# Patient Record
Sex: Male | Born: 1970 | Race: Black or African American | Hispanic: No | Marital: Single | Smoking: Never smoker
Health system: Southern US, Community
[De-identification: ages and names within clinical notes are randomized; demographics above are authoritative.]

## PROBLEM LIST (undated history)

## (undated) ENCOUNTER — Ambulatory Visit: Admission: EM

## (undated) DIAGNOSIS — L0293 Carbuncle, unspecified: Secondary | ICD-10-CM

## (undated) DIAGNOSIS — F209 Schizophrenia, unspecified: Secondary | ICD-10-CM

## (undated) DIAGNOSIS — D229 Melanocytic nevi, unspecified: Secondary | ICD-10-CM

## (undated) DIAGNOSIS — R3589 Other polyuria: Secondary | ICD-10-CM

## (undated) DIAGNOSIS — L0292 Furuncle, unspecified: Secondary | ICD-10-CM

## (undated) DIAGNOSIS — H547 Unspecified visual loss: Secondary | ICD-10-CM

## (undated) DIAGNOSIS — R569 Unspecified convulsions: Secondary | ICD-10-CM

## (undated) DIAGNOSIS — R109 Unspecified abdominal pain: Secondary | ICD-10-CM

## (undated) DIAGNOSIS — R358 Other polyuria: Secondary | ICD-10-CM

## (undated) DIAGNOSIS — M7989 Other specified soft tissue disorders: Secondary | ICD-10-CM

## (undated) DIAGNOSIS — K5900 Constipation, unspecified: Secondary | ICD-10-CM

## (undated) DIAGNOSIS — F419 Anxiety disorder, unspecified: Secondary | ICD-10-CM

## (undated) HISTORY — DX: Other specified soft tissue disorders: M79.89

## (undated) HISTORY — DX: Other polyuria: R35.8

## (undated) HISTORY — DX: Anxiety disorder, unspecified: F41.9

## (undated) HISTORY — DX: Furuncle, unspecified: L02.92

## (undated) HISTORY — DX: Constipation, unspecified: K59.00

## (undated) HISTORY — DX: Unspecified abdominal pain: R10.9

## (undated) HISTORY — DX: Melanocytic nevi, unspecified: D22.9

## (undated) HISTORY — DX: Other polyuria: R35.89

## (undated) HISTORY — DX: Unspecified visual loss: H54.7

## (undated) HISTORY — DX: Schizophrenia, unspecified: F20.9

## (undated) HISTORY — DX: Carbuncle, unspecified: L02.93

---

## 1990-08-15 DIAGNOSIS — R569 Unspecified convulsions: Secondary | ICD-10-CM

## 1990-08-15 HISTORY — DX: Unspecified convulsions: R56.9

## 2005-01-13 ENCOUNTER — Emergency Department: Payer: Self-pay | Admitting: Unknown Physician Specialty

## 2005-09-27 ENCOUNTER — Emergency Department: Payer: Self-pay | Admitting: Emergency Medicine

## 2006-04-30 ENCOUNTER — Emergency Department: Payer: Self-pay | Admitting: Unknown Physician Specialty

## 2006-05-24 ENCOUNTER — Inpatient Hospital Stay: Payer: Self-pay | Admitting: Internal Medicine

## 2008-03-19 ENCOUNTER — Emergency Department: Payer: Self-pay | Admitting: Emergency Medicine

## 2015-01-08 ENCOUNTER — Ambulatory Visit (INDEPENDENT_AMBULATORY_CARE_PROVIDER_SITE_OTHER): Payer: Medicaid Other | Admitting: Surgery

## 2015-01-08 ENCOUNTER — Encounter: Payer: Self-pay | Admitting: Surgery

## 2015-01-08 VITALS — BP 119/80 | HR 86 | Temp 97.7°F | Resp 20 | Ht 68.0 in | Wt 150.0 lb

## 2015-01-08 DIAGNOSIS — R221 Localized swelling, mass and lump, neck: Secondary | ICD-10-CM | POA: Diagnosis not present

## 2015-01-08 NOTE — Progress Notes (Signed)
  Surgical Consultation  01/08/2015  Carl [name]. is an 44 y.o. male.   CC: Neck mass  HPI: Large neck mass at the occiput on the left side which has been present for over 15 years. It causes him minimal pain if any but has been growing.  Past Medical History  Diagnosis Date  . Schizophrenia     History reviewed. No pertinent past surgical history.  Family History  Problem Relation Age of Onset  . Hyperlipidemia Mother   . Hypertension Father   . Diabetes Father     Social History:  reports that he has never smoked. He has never used smokeless tobacco. He reports that he does not drink alcohol or use illicit drugs.  Allergies: No Known Allergies  Medications reviewed.   Review of Systems:   Review of Systems  Constitutional: Negative.   HENT: Negative.   Eyes: Negative.   Respiratory: Negative.   Cardiovascular: Negative.   Gastrointestinal: Negative.   Genitourinary: Negative.   Musculoskeletal: Negative.   Skin: Negative.   Neurological: Negative.   Endo/Heme/Allergies: Negative.   Psychiatric/Behavioral:       Patient is schizophrenic     Physical Exam:  BP 119/80 mmHg  Pulse 86  Temp(Src) 97.7 F (36.5 C) (Oral)  Resp 20  Ht 5\' 8"  (1.727 m)  Wt 150 lb (68.04 kg)  BMI 22.81 kg/m2  Physical Exam  Constitutional: He is oriented to person, place, and time and well-developed, well-nourished, and in no distress.  HENT:  Head: Normocephalic.  Left occipital and nuchal mass measuring 8 cm  Eyes: Pupils are equal, round, and reactive to light. No scleral icterus.  Neck: Normal range of motion. Neck supple.  Left nuchal and occipital mass  Cardiovascular: Normal rate, regular rhythm and normal heart sounds.   Pulmonary/Chest: Effort normal and breath sounds normal. No respiratory distress. He has no wheezes. He has no rales.  Abdominal: Soft. Bowel sounds are normal.  Musculoskeletal: Normal range of motion.  Lymphadenopathy:    He has no  cervical adenopathy.  Neurological: He is alert and oriented to person, place, and time.  Skin: Skin is warm and dry.  Psychiatric: Affect normal.      No results found for this or any previous visit (from the past 48 hour(s)). No results found.  Assessment/Plan:  This patient with a large nuchal occipital mass on the left side. It is been growing but causing him minimal if any pain. They're requesting excision. His mother is present as well. He is schizophrenic.  Discussed with them the rationale for offering surgery the options of observation and the risks of bleeding infection recurrence cosmetic deformity accessory spinal nerve injury with shoulder shrug issues or weakness. This was all reviewed for them they understood and agreed to proceed.  Florene Glen, MD, FACS

## 2015-01-15 ENCOUNTER — Telehealth: Payer: Self-pay | Admitting: Surgery

## 2015-01-15 NOTE — Telephone Encounter (Signed)
Pt advised of pre op date/time and sx date. Sx: 02/11/15 with Dr Burt Knack in [place].  Pt was advised that [place] sx will call pt with details.

## 2015-02-04 ENCOUNTER — Encounter: Payer: Self-pay | Admitting: *Deleted

## 2015-02-10 ENCOUNTER — Other Ambulatory Visit: Payer: Self-pay | Admitting: *Deleted

## 2015-02-10 ENCOUNTER — Encounter: Payer: Self-pay | Admitting: *Deleted

## 2015-02-11 ENCOUNTER — Encounter
Admission: RE | Disposition: A | Discharge status: Home / Self Care | Payer: Self-pay | Source: Ambulatory Visit | Attending: Surgery

## 2015-02-11 ENCOUNTER — Ambulatory Visit: Payer: Medicare Other | Admitting: Anesthesiology

## 2015-02-11 ENCOUNTER — Other Ambulatory Visit
Admission: RE | Admit: 2015-02-11 | Discharge: 2015-02-11 | Disposition: A | Discharge status: Home / Self Care | Payer: Medicare Other | Source: Ambulatory Visit | Attending: Surgery | Admitting: Surgery

## 2015-02-11 ENCOUNTER — Ambulatory Visit
Admission: RE | Admit: 2015-02-11 | Discharge: 2015-02-11 | Disposition: A | Discharge status: Home / Self Care | Payer: Medicare Other | Source: Ambulatory Visit | Attending: Surgery | Admitting: Surgery

## 2015-02-11 DIAGNOSIS — Z8489 Family history of other specified conditions: Secondary | ICD-10-CM | POA: Insufficient documentation

## 2015-02-11 DIAGNOSIS — D170 Benign lipomatous neoplasm of skin and subcutaneous tissue of head, face and neck: Secondary | ICD-10-CM | POA: Insufficient documentation

## 2015-02-11 DIAGNOSIS — Z8249 Family history of ischemic heart disease and other diseases of the circulatory system: Secondary | ICD-10-CM | POA: Insufficient documentation

## 2015-02-11 DIAGNOSIS — R221 Localized swelling, mass and lump, neck: Secondary | ICD-10-CM | POA: Diagnosis not present

## 2015-02-11 DIAGNOSIS — Z833 Family history of diabetes mellitus: Secondary | ICD-10-CM | POA: Insufficient documentation

## 2015-02-11 DIAGNOSIS — D234 Other benign neoplasm of skin of scalp and neck: Secondary | ICD-10-CM

## 2015-02-11 DIAGNOSIS — F209 Schizophrenia, unspecified: Secondary | ICD-10-CM | POA: Diagnosis not present

## 2015-02-11 DIAGNOSIS — R109 Unspecified abdominal pain: Secondary | ICD-10-CM | POA: Diagnosis present

## 2015-02-11 HISTORY — PX: MASS EXCISION: SHX2000

## 2015-02-11 LAB — CBC WITH DIFFERENTIAL/PLATELET
BASOS PCT: 1 %
Basophils Absolute: 0 10*3/uL (ref 0–0.1)
EOS ABS: 0.1 10*3/uL (ref 0–0.7)
Eosinophils Relative: 2 %
HCT: 41.9 % (ref 40.0–52.0)
HEMOGLOBIN: 13.8 g/dL (ref 13.0–18.0)
Lymphocytes Relative: 50 %
Lymphs Abs: 2.3 10*3/uL (ref 1.0–3.6)
MCH: 27.1 pg (ref 26.0–34.0)
MCHC: 32.9 g/dL (ref 32.0–36.0)
MCV: 82.2 fL (ref 80.0–100.0)
MONO ABS: 0.4 10*3/uL (ref 0.2–1.0)
MONOS PCT: 9 %
NEUTROS ABS: 1.7 10*3/uL (ref 1.4–6.5)
Neutrophils Relative %: 38 %
Platelets: 181 10*3/uL (ref 150–440)
RBC: 5.09 MIL/uL (ref 4.40–5.90)
RDW: 14.2 % (ref 11.5–14.5)
WBC: 4.5 10*3/uL (ref 3.8–10.6)

## 2015-02-11 LAB — BASIC METABOLIC PANEL
Anion gap: 5 (ref 5–15)
BUN: 13 mg/dL (ref 6–20)
CALCIUM: 9.3 mg/dL (ref 8.9–10.3)
CO2: 32 mmol/L (ref 22–32)
Chloride: 103 mmol/L (ref 101–111)
Creatinine, Ser: 1.07 mg/dL (ref 0.61–1.24)
GFR calc Af Amer: >60 mL/min (ref >60)
GLUCOSE: 99 mg/dL (ref 65–99)
Potassium: 4.1 mmol/L (ref 3.5–5.1)
Sodium: 140 mmol/L (ref 135–145)

## 2015-02-11 SURGERY — EXCISION MASS
Anesthesia: General | Laterality: Left | Wound class: Clean

## 2015-02-11 MED ORDER — MIDAZOLAM HCL 5 MG/5ML IJ SOLN
INTRAMUSCULAR | Status: DC | PRN
  Administered 2015-02-11: 2 mg via INTRAVENOUS

## 2015-02-11 MED ORDER — OXYCODONE HCL 5 MG/5ML PO SOLN: 5.0000 mg | Freq: Once | ORAL | Status: DC | PRN

## 2015-02-11 MED ORDER — PROPOFOL 10 MG/ML IV BOLUS
INTRAVENOUS | Status: DC | PRN
  Administered 2015-02-11: 200 mg via INTRAVENOUS

## 2015-02-11 MED ORDER — FENTANYL CITRATE (PF) 100 MCG/2ML IJ SOLN
INTRAMUSCULAR | Status: DC | PRN
  Administered 2015-02-11: 100 ug via INTRAVENOUS

## 2015-02-11 MED ORDER — LIDOCAINE HCL (CARDIAC) 20 MG/ML IV SOLN
INTRAVENOUS | Status: DC | PRN
  Administered 2015-02-11: 40 mg via INTRATRACHEAL

## 2015-02-11 MED ORDER — OXYCODONE HCL 5 MG PO TABS: 5.0000 mg | ORAL_TABLET | Freq: Once | ORAL | Status: DC | PRN

## 2015-02-11 MED ORDER — HYDROCODONE-ACETAMINOPHEN 5-300 MG PO TABS: 1.0000 | ORAL_TABLET | ORAL | Status: DC | PRN

## 2015-02-11 MED ORDER — BUPIVACAINE-EPINEPHRINE 0.5% -1:200000 IJ SOLN
INTRAMUSCULAR | Status: DC | PRN
  Administered 2015-02-11: 14 mL

## 2015-02-11 MED ORDER — HYDROMORPHONE HCL 1 MG/ML IJ SOLN: 0.2500 mg | INTRAMUSCULAR | Status: DC | PRN

## 2015-02-11 MED ORDER — ONDANSETRON HCL 4 MG/2ML IJ SOLN: 4.0000 mg | Freq: Once | INTRAMUSCULAR | Status: DC | PRN

## 2015-02-11 MED ORDER — LACTATED RINGERS IV SOLN
INTRAVENOUS | Status: DC
  Administered 2015-02-11: 13:00:00 via INTRAVENOUS

## 2015-02-11 MED ORDER — ONDANSETRON HCL 4 MG/2ML IJ SOLN
INTRAMUSCULAR | Status: DC | PRN
  Administered 2015-02-11: 4 mg via INTRAVENOUS

## 2015-02-11 MED ORDER — DEXAMETHASONE SODIUM PHOSPHATE 4 MG/ML IJ SOLN
INTRAMUSCULAR | Status: DC | PRN
  Administered 2015-02-11: 8 mg via INTRAVENOUS

## 2015-02-11 SURGICAL SUPPLY — 50 items
ADHESIVE MASTISOL STRL (MISCELLANEOUS) IMPLANT
BLADE SURG 15 STRL LF DISP TIS (BLADE) ×1 IMPLANT
BLADE SURG 15 STRL SS (BLADE) ×2
CANISTER SUCT 1200ML W/VALVE (MISCELLANEOUS) ×3 IMPLANT
CHLORAPREP W/TINT 26ML (MISCELLANEOUS) ×3 IMPLANT
CLOSURE WOUND 1/2 X4 (GAUZE/BANDAGES/DRESSINGS)
COVER LIGHT HANDLE FLEXIBLE (MISCELLANEOUS) ×6 IMPLANT
DRAPE LAPAROTOMY T 102X78X121 (DRAPES) ×3 IMPLANT
DRSG TEGADERM 4X4.75 (GAUZE/BANDAGES/DRESSINGS) IMPLANT
GAUZE SPONGE 4X4 12PLY STRL (GAUZE/BANDAGES/DRESSINGS) ×3 IMPLANT
GLOVE BIO SURGEON STRL SZ7.5 (GLOVE) ×3 IMPLANT
GOWN STRL REUS W/ TWL LRG LVL3 (GOWN DISPOSABLE) ×1 IMPLANT
GOWN STRL REUS W/ TWL XL LVL3 (GOWN DISPOSABLE) ×1 IMPLANT
GOWN STRL REUS W/TWL LRG LVL3 (GOWN DISPOSABLE) ×2
GOWN STRL REUS W/TWL XL LVL3 (GOWN DISPOSABLE) ×2
LIQUID BAND (GAUZE/BANDAGES/DRESSINGS) IMPLANT
NDL HYPO TW 22X1.5 (NEEDLE) ×3 IMPLANT
NEEDLE HYPO 25GX1X1/2 BEV (NEEDLE) ×3 IMPLANT
NS IRRIG 500ML POUR BTL (IV SOLUTION) ×3 IMPLANT
PACK BASIN MINOR ARMC (MISCELLANEOUS) ×3 IMPLANT
PAD GROUND ADULT SPLIT (MISCELLANEOUS) ×3 IMPLANT
SOL PREP PVP 2OZ (MISCELLANEOUS)
SOLUTION PREP PVP 2OZ (MISCELLANEOUS) IMPLANT
SPONGE LAP 18X18 5 PK (GAUZE/BANDAGES/DRESSINGS) ×3 IMPLANT
STRAP BODY AND KNEE 60X3 (MISCELLANEOUS) ×3 IMPLANT
STRIP CLOSURE SKIN 1/2X4 (GAUZE/BANDAGES/DRESSINGS) IMPLANT
SUT ETHILON 2 0 FSLX (SUTURE) IMPLANT
SUT ETHILON 3-0 FS-10 30 BLK (SUTURE)
SUT ETHILON 4-0 (SUTURE)
SUT ETHILON 4-0 FS2 18XMFL BLK (SUTURE)
SUT MNCRL 4-0 (SUTURE) ×2
SUT MNCRL 4-0 27XMFL (SUTURE) ×1
SUT MNCRL 5-0+ PC-1 (SUTURE) IMPLANT
SUT MONOCRYL 5-0 (SUTURE)
SUT PROLENE 0 CT 1 30 (SUTURE) IMPLANT
SUT VIC AB 0 CT1 36 (SUTURE) IMPLANT
SUT VIC AB 2-0 CT1 27 (SUTURE)
SUT VIC AB 2-0 CT1 TAPERPNT 27 (SUTURE) IMPLANT
SUT VIC AB 2-0 SH 27 (SUTURE)
SUT VIC AB 2-0 SH 27XBRD (SUTURE) IMPLANT
SUT VIC AB 3-0 SH 27 (SUTURE) ×2
SUT VIC AB 3-0 SH 27X BRD (SUTURE) ×1 IMPLANT
SUT VIC AB 4-0 PS2 18 (SUTURE) IMPLANT
SUT VIC AB 4-0 RB1 27 (SUTURE)
SUT VIC AB 4-0 RB1 27X BRD (SUTURE) IMPLANT
SUTURE EHLN 3-0 FS-10 30 BLK (SUTURE) IMPLANT
SUTURE ETHLN 4-0 FS2 18XMF BLK (SUTURE) IMPLANT
SUTURE MNCRL 4-0 27XMF (SUTURE) ×1 IMPLANT
SYR BULB IRRIG 60ML STRL (SYRINGE) ×3 IMPLANT
SYRINGE 10CC LL (SYRINGE) ×3 IMPLANT

## 2015-02-11 NOTE — Progress Notes (Signed)
Preoperative Review   Patient is met in the preoperative holding area. The history is reviewed in the chart and with the patient. I personally reviewed the options and rationale as well as the risks of this procedure that have been previously discussed with the patient. All questions asked by the patient and/or family were answered to their satisfaction.  Patient reexamined and marked history was reviewed with patient and family.  Patient agrees to proceed with this procedure at this time.  Florene Glen M.D. FACS

## 2015-02-11 NOTE — Discharge Instructions (Signed)
General Anesthesia, Care After Refer to this sheet in the next few weeks. These instructions provide you with information on caring for yourself after your procedure. Your health care provider may also give you more specific instructions. Your treatment has been planned according to current medical practices, but problems sometimes occur. Call your health care provider if you have any problems or questions after your procedure. WHAT TO EXPECT AFTER THE PROCEDURE After the procedure, it is typical to experience:  Sleepiness.  Nausea and vomiting. HOME CARE INSTRUCTIONS  For the first 24 hours after general anesthesia:  Have a responsible person with you.  Do not drive a car. If you are alone, do not take public transportation.  Do not drink alcohol.  Do not take medicine that has not been prescribed by your health care provider.  Do not sign important papers or make important decisions.  You may resume a normal diet and activities as directed by your health care provider.  Change bandages (dressings) as directed.  If you have questions or problems that seem related to general anesthesia, call the hospital and ask for the anesthetist or anesthesiologist on call. SEEK MEDICAL CARE IF:  You have nausea and vomiting that continue the day after anesthesia.  You develop a rash. SEEK IMMEDIATE MEDICAL CARE IF:   You have difficulty breathing.  You have chest pain.  You have any allergic problems. Document Released: 09/27/2000 Document Revised: 06/26/2013 Document Reviewed: 01/04/2013 [facility] Patient Information 2015 [place], [place]. This information is not intended to replace advice given to you by your health care provider. Make sure you discuss any questions you have with your health care provider.  Remove dressing in 24 hours. May shower in 24 hours. Leave paper strips in place. Resume all home medications. Follow-up with Dr. Burt Knack in 10 days.

## 2015-02-11 NOTE — Transfer of Care (Signed)
Immediate Anesthesia Transfer of Care Note  Patient: Carl [name].  Procedure(s) Performed: Procedure(s): EXCISION MASS, left post neck (Left)  Patient Location: PACU  Anesthesia Type: General  Level of Consciousness: awake, alert  and patient cooperative  Airway and Oxygen Therapy: Patient Spontanous Breathing and Patient connected to supplemental oxygen  Post-op Assessment: Post-op Vital signs reviewed, Patient's Cardiovascular Status Stable, Respiratory Function Stable, Patent Airway and No signs of Nausea or vomiting  Post-op Vital Signs: Reviewed and stable  Complications: No apparent anesthesia complications

## 2015-02-11 NOTE — Anesthesia Postprocedure Evaluation (Signed)
  Anesthesia Post-op Note  Patient: Carl [name].  Procedure(s) Performed: Procedure(s): EXCISION MASS, left post neck (Left)  Anesthesia type:General  Patient location: PACU  Post pain: Pain level controlled  Post assessment: Post-op Vital signs reviewed, Patient's Cardiovascular Status Stable, Respiratory Function Stable, Patent Airway and No signs of Nausea or vomiting  Post vital signs: Reviewed and stable  Last Vitals:  Filed Vitals:   02/11/15 1445  BP: 101/68  Pulse: 72  Temp:   Resp: 21    Level of consciousness: awake, alert  and patient cooperative  Complications: No apparent anesthesia complications

## 2015-02-11 NOTE — Anesthesia Procedure Notes (Signed)
Procedure Name: LMA Insertion Date/Time: 02/11/2015 1:56 PM Performed by: Londell Moh Pre-anesthesia Checklist: Patient identified, Emergency Drugs available, Suction available, Timeout performed and Patient being monitored Patient Re-evaluated:Patient Re-evaluated prior to inductionOxygen Delivery Method: Circle system utilized Preoxygenation: Pre-oxygenation with 100% oxygen Intubation Type: IV induction LMA: LMA inserted LMA Size: 4.0 Number of attempts: 1 Placement Confirmation: positive ETCO2 and breath sounds checked- equal and bilateral Tube secured with: Tape

## 2015-02-11 NOTE — Op Note (Signed)
02/11/2015  2:32 PM  PATIENT:  Carl [name].  44 y.o. male  PRE-OPERATIVE DIAGNOSIS:  Left occipital mass  POST-OPERATIVE DIAGNOSIS:  Same  PROCEDURE: Excisional biopsy of the left occipital mass  SURGEON:  Florene Glen MD, FACS   ANESTHESIA:   Gen. with LMA   Details of Procedure: This patient with a large left occipital mass requiring excisional biopsy. Preoperatively discussed rationale for surgery the options of observation risk bleeding infection recurrence accessory spinal nerve injury and cosmetic deformity. This is all reviewed for them in the preop holding area in the presence of his family understood and agreed to proceed  Patient was induced to general anesthesia and placed in a well-padded right lateral recumbent position. He is a marked visible and palpable mass was identified and local anesthesia was infiltrated into the skin and subcutaneous tissues around the mass. This was made and dissection down to a large multiloculated lipoma was performed. It was removed in pieces. Estimated size was approximately 7 cm. Vision was 8 cm. The mass was elevated in pieces and sent off for examination. There was no residual mass present hemostasis was with the careful and judicious use of electrocautery. The spinal accessory nerve was not identified.  Once assuring hemostasis was adequate the wound was closed in an intermediate fashion with deep sutures of 30 Vicryls followed by 4-0 subcuticular Monocryl Steri-Strips and Mastisol and sterile dressings were placed  Patient tolerated this procedure well there were a couple occasions he was taken to recovery room in stable condition to be discharged care of his family and follow-up in 10 days.   Florene Glen, MD FACS

## 2015-02-11 NOTE — Anesthesia Preprocedure Evaluation (Signed)
Anesthesia Evaluation  Patient identified by MRN, date of birth, ID band Patient awake    Reviewed: Allergy & Precautions, NPO status , Patient's Chart, lab work & pertinent test results  Airway Mallampati: II  TM Distance: >3 FB Neck ROM: Full    Dental   Pulmonary    Pulmonary exam normal       Cardiovascular Normal cardiovascular exam    Neuro/Psych PSYCHIATRIC DISORDERS Schizophrenia    GI/Hepatic   Endo/Other    Renal/GU      Musculoskeletal   Abdominal   Peds  Hematology   Anesthesia Other Findings   Reproductive/Obstetrics                             Anesthesia Physical Anesthesia Plan  ASA: II  Anesthesia Plan: General   Post-op Pain Management:    Induction: Intravenous  Airway Management Planned: Oral ETT  Additional Equipment:   Intra-op Plan:   Post-operative Plan: Extubation in OR  Informed Consent: I have reviewed the patients History and Physical, chart, labs and discussed the procedure including the risks, benefits and alternatives for the proposed anesthesia with the patient or authorized representative who has indicated his/her understanding and acceptance.   Dental advisory given and Consent reviewed with POA  Plan Discussed with: CRNA  Anesthesia Plan Comments:         Anesthesia Quick Evaluation

## 2015-02-11 NOTE — H&P (Addendum)
Expand All Collapse All    Surgical Consultation  02/11/2015  Carl [name]. is an 44 y.o. male.   I will meet with the patient in the preop holding area at [facility] and review the options rationale and risks with the patient as there have been no changes to his medical conditions.  CC: Neck mass  HPI: Large neck mass at the occiput on the left side which has been present for over 15 years. It causes him minimal pain if any but has been growing.  Past Medical History  Diagnosis Date  . Schizophrenia     History reviewed. No pertinent past surgical history.  Family History  Problem Relation Age of Onset  . Hyperlipidemia Mother   . Hypertension Father   . Diabetes Father     Social History:  reports that he has never smoked. He has never used smokeless tobacco. He reports that he does not drink alcohol or use illicit drugs.  Allergies: No Known Allergies  Medications reviewed.   Review of Systems:   Review of Systems  Constitutional: Negative.  HENT: Negative.  Eyes: Negative.  Respiratory: Negative.  Cardiovascular: Negative.  Gastrointestinal: Negative.  Genitourinary: Negative.  Musculoskeletal: Negative.  Skin: Negative.  Neurological: Negative.  Endo/Heme/Allergies: Negative.  Psychiatric/Behavioral:   Patient is schizophrenic     Physical Exam:  BP 119/80 mmHg  Pulse 86  Temp(Src) 97.7 F (36.5 C) (Oral)  Resp 20  Ht 5\' 8"  (1.727 m)  Wt 150 lb (68.04 kg)  BMI 22.81 kg/m2  Physical Exam  Constitutional: He is oriented to person, place, and time and well-developed, well-nourished, and in no distress.  HENT:  Head: Normocephalic.  Left occipital and nuchal mass measuring 8 cm  Eyes: Pupils are equal, round, and reactive to light. No scleral icterus.  Neck: Normal range of motion. Neck supple.  Left nuchal and occipital mass  Cardiovascular: Normal rate, regular rhythm and normal heart  sounds.  Pulmonary/Chest: Effort normal and breath sounds normal. No respiratory distress. He has no wheezes. He has no rales.  Abdominal: Soft. Bowel sounds are normal.  Musculoskeletal: Normal range of motion.  Lymphadenopathy:   He has no cervical adenopathy.  Neurological: He is alert and oriented to person, place, and time.  Skin: Skin is warm and dry.  Psychiatric: Affect normal.       Lab Results Last 48 Hours    No results found for this or any previous visit (from the past 48 hour(s)).    Imaging Results (Last 48 hours)    No results found.    Assessment/Plan:  This patient with a large nuchal occipital mass on the left side. It is been growing but causing him minimal if any pain. They're requesting excision. His mother is present as well. He is schizophrenic.  Discussed with them the rationale for offering surgery the options of observation and the risks of bleeding infection recurrence cosmetic deformity accessory spinal nerve injury with shoulder shrug issues or weakness. This was all reviewed for them they understood and agreed to proceed.  The risks will be reviewed in detail with the patient in the [facility] as above. Florene Glen, MD, FACS

## 2015-02-12 ENCOUNTER — Encounter: Payer: Self-pay | Admitting: Surgery

## 2015-02-13 LAB — SURGICAL PATHOLOGY

## 2015-02-20 ENCOUNTER — Encounter: Payer: Self-pay | Admitting: Surgery

## 2015-02-20 ENCOUNTER — Ambulatory Visit (INDEPENDENT_AMBULATORY_CARE_PROVIDER_SITE_OTHER): Payer: Medicaid Other | Admitting: Surgery

## 2015-02-20 VITALS — BP 117/69 | HR 85 | Temp 97.8°F | Wt 150.0 lb

## 2015-02-20 DIAGNOSIS — Z4889 Encounter for other specified surgical aftercare: Secondary | ICD-10-CM

## 2015-02-20 NOTE — Progress Notes (Signed)
Surgery Progress Note  S: Doing well.  No pain.  No redness/drainage.  Significant increase in size since postop O:Blood pressure 117/69, pulse 85, temperature 97.8 F (36.6 C), temperature source Oral, weight 150 lb (68.04 kg). GEN: NAD/A&Ox3 NECK/occiput: Approx 7 x 5 cm fluctuance underlying incision, incision c/d/i  A/P 44 yo s/p Excision of lipoma on occiput.  Seroma recurrence, no erythema/induration - would not drain at this time due to likeliness of recurrence and risk of infection - f/u in 1 week to ensure no need for aspiration if not improving

## 2015-02-20 NOTE — Patient Instructions (Signed)
Do not drive on pain medications Do not lift greater than 15 lbs for a period of 6 weeks Call or return to ER if you develop fever greater than 101.5, nausea/vomiting, increased pain, redness/drainage from incisions

## 2015-02-21 ENCOUNTER — Telehealth: Payer: Self-pay | Admitting: General Surgery

## 2015-02-21 NOTE — Telephone Encounter (Signed)
Patient had a neck mass removed on the back of his head. It has drainage and pus coming out. Please call and advise. Concerned about this.

## 2015-02-27 ENCOUNTER — Ambulatory Visit (INDEPENDENT_AMBULATORY_CARE_PROVIDER_SITE_OTHER): Payer: Medicare Other | Admitting: General Surgery

## 2015-02-27 ENCOUNTER — Encounter: Payer: Self-pay | Admitting: General Surgery

## 2015-02-27 VITALS — BP 115/76 | HR 93 | Temp 97.8°F | Ht 68.0 in | Wt 149.0 lb

## 2015-02-27 DIAGNOSIS — T814XXD Infection following a procedure, subsequent encounter: Secondary | ICD-10-CM | POA: Diagnosis not present

## 2015-02-27 DIAGNOSIS — IMO0001 Reserved for inherently not codable concepts without codable children: Secondary | ICD-10-CM

## 2015-02-27 DIAGNOSIS — IMO0002 Reserved for concepts with insufficient information to code with codable children: Secondary | ICD-10-CM | POA: Insufficient documentation

## 2015-02-27 DIAGNOSIS — T792XXD Traumatic secondary and recurrent hemorrhage and seroma, subsequent encounter: Secondary | ICD-10-CM

## 2015-02-27 MED ORDER — SULFAMETHOXAZOLE-TRIMETHOPRIM 800-160 MG PO TABS: 1.0000 | ORAL_TABLET | Freq: Two times a day (BID) | ORAL | Status: DC

## 2015-02-27 NOTE — Progress Notes (Signed)
Outpatient Surgical Follow Up  02/27/2015  Ronne Stefanski. is an 44 y.o. male.   Chief Complaint  Patient presents with  . Post-op Problem    Lipoma removal with Seroma    HPI: 44 year old male returns to clinic for follow-up of a left posterior neck lipoma removal. Patient was noted have a seroma formation seen last week in clinic. Patient reports that since last week area has decreased in size, however there has been some drainage noted at night when laying on his pillow. Patient and mother describes it as a combination of blood and pus. Patient denies any fevers, chills, nausea, vomiting. Patient states that feels much better than it did. Not currently draining.  Past Medical History  Diagnosis Date  . Schizophrenia   . Recurrent boils     scalp behind ear  . Soft tissue mass     left scalp posterior  . Nevus   . Decreased visual acuity   . Polyuria   . Abdominal pain   . Constipation     Past Surgical History  Procedure Laterality Date  . Mass excision Left 02/11/2015    Procedure: EXCISION MASS, left post neck;  Surgeon: Florene Glen, MD;  Location: [facility];  Service: General;  Laterality: Left;    Family History  Problem Relation Age of Onset  . Hyperlipidemia Mother   . Hypertension Father   . Diabetes Father     Social History:  reports that he has never smoked. He has never used smokeless tobacco. He reports that he does not drink alcohol or use illicit drugs.  Allergies: No Known Allergies  Medications reviewed.    ROS multisystem review of systems was completed all pertinent positives and negatives were reviewed in the history of present illness remainder negative.    BP 115/76 mmHg  Pulse 93  Temp(Src) 97.8 F (36.6 C) (Oral)  Ht 5\' 8"  (1.727 m)  Wt 67.586 kg (149 lb)  BMI 22.66 kg/m2  Physical Exam  Gen.: No acute distress Neck: Supple without any lymphadenopathy Chest: Clear to sedation regular rhythm Abdomen: Soft,  nontender, nondistended. Skin: left posterior neck excision site visualized. There is a scab over the most lateral aspect of the incision. There is no visible opening. There is no hyperemia. There is no expressible drainage. The area is nontender     No results found for this or any previous visit (from the past 48 hour(s)). No results found.  Assessment/Plan:  1. Infected postoperative seroma, subsequent encounter 44 year old male status post left posterior neck/scalp lipoma excision. Given reported history of pus drainage we'll start on topical and oral antibiotics. (Neosporin and Bactrim.) No expressible pus or opening visualized and noted to be opened in clinic today. Discussed with patient and his parents that should the area become more painful continue to drain pus or enlarged in size he is not overweight week to return he is to return immediately. He may yet require surgical opening of this now possible infected seroma. Follow-up in one week     Arcelia Jew, MD [facility] General Surgeon [facility] Surgical  02/27/2015

## 2015-02-27 NOTE — Patient Instructions (Signed)
Apply Neosporin and a Bandaid to the area where this is draining.  We have sent the prescription for antibiotics to your [facility]. Take the medication twice daily until it is completely gone.  Follow-up in 1 week.  If this area gets bigger, draining a significant amount of pus, or you develop a fever- please call the office immediately and we will work you in that day.

## 2015-03-06 ENCOUNTER — Encounter: Payer: Self-pay | Admitting: Surgery

## 2015-03-06 ENCOUNTER — Ambulatory Visit (INDEPENDENT_AMBULATORY_CARE_PROVIDER_SITE_OTHER): Payer: Medicare Other | Admitting: Surgery

## 2015-03-06 VITALS — BP 105/76 | HR 99 | Temp 97.9°F | Ht 68.0 in | Wt 149.0 lb

## 2015-03-06 DIAGNOSIS — Z4889 Encounter for other specified surgical aftercare: Secondary | ICD-10-CM

## 2015-03-06 NOTE — Progress Notes (Signed)
Follow-up after excision of a large lipoma of the occipital nuchal area on the left. Patient states he's feeling better has no problems and is finishing his antibiotic's today.  Of note Dr. Adonis Huguenin had coded this as a "infected seroma" But he had been unable to express any purulence and not noted any signs of infection but did place him on antibiotic's. Currently patient is doing very well  Wound healing well no erythema no drainage induration is present as expected in the postoperative period suggesting of a small seroma without signs of infection.  She doing very well recommend follow up on an as-needed basis.

## 2016-01-13 ENCOUNTER — Emergency Department
Admission: EM | Admit: 2016-01-13 | Discharge: 2016-01-13 | Disposition: A | Discharge status: Home / Self Care | Payer: Medicare Other | Attending: Emergency Medicine | Admitting: Emergency Medicine

## 2016-01-13 ENCOUNTER — Encounter: Payer: Self-pay | Admitting: Emergency Medicine

## 2016-01-13 DIAGNOSIS — Z79899 Other long term (current) drug therapy: Secondary | ICD-10-CM | POA: Diagnosis not present

## 2016-01-13 DIAGNOSIS — Y929 Unspecified place or not applicable: Secondary | ICD-10-CM | POA: Diagnosis not present

## 2016-01-13 DIAGNOSIS — Y999 Unspecified external cause status: Secondary | ICD-10-CM | POA: Diagnosis not present

## 2016-01-13 DIAGNOSIS — X58XXXA Exposure to other specified factors, initial encounter: Secondary | ICD-10-CM | POA: Diagnosis not present

## 2016-01-13 DIAGNOSIS — F209 Schizophrenia, unspecified: Secondary | ICD-10-CM | POA: Diagnosis not present

## 2016-01-13 DIAGNOSIS — S3992XA Unspecified injury of lower back, initial encounter: Secondary | ICD-10-CM | POA: Diagnosis present

## 2016-01-13 DIAGNOSIS — Y939 Activity, unspecified: Secondary | ICD-10-CM | POA: Diagnosis not present

## 2016-01-13 DIAGNOSIS — S39012A Strain of muscle, fascia and tendon of lower back, initial encounter: Secondary | ICD-10-CM

## 2016-01-13 MED ORDER — CYCLOBENZAPRINE HCL 5 MG PO TABS: 5.0000 mg | ORAL_TABLET | Freq: Three times a day (TID) | ORAL | Status: DC | PRN

## 2016-01-13 MED ORDER — NAPROXEN 500 MG PO TBEC: 500.0000 mg | DELAYED_RELEASE_TABLET | Freq: Two times a day (BID) | ORAL | Status: DC

## 2016-01-13 NOTE — Discharge Instructions (Signed)
Your exam appears to show some muscle strain to the lower back. You have normal movement and strength. Take the prescription meds as directed. Apply moist heat or ice compresses to the back for comfort. Follow-up with Dr. [name] for continued symptoms.   Lumbosacral Strain Lumbosacral strain is a strain of any of the parts that make up your lumbosacral vertebrae. Your lumbosacral vertebrae are the bones that make up the lower third of your backbone. Your lumbosacral vertebrae are held together by muscles and tough, fibrous tissue (ligaments).  CAUSES  A sudden blow to your back can cause lumbosacral strain. Also, anything that causes an excessive stretch of the muscles in the low back can cause this strain. This is typically seen when people exert themselves strenuously, fall, lift heavy objects, bend, or crouch repeatedly. RISK FACTORS  Physically demanding work.  Participation in pushing or pulling sports or sports that require a sudden twist of the back (tennis, golf, baseball).  Weight lifting.  Excessive lower back curvature.  Forward-tilted pelvis.  Weak back or abdominal muscles or both.  Tight hamstrings. SIGNS AND SYMPTOMS  Lumbosacral strain may cause pain in the area of your injury or pain that moves (radiates) down your leg.  DIAGNOSIS Your health care provider can often diagnose lumbosacral strain through a physical exam. In some cases, you may need tests such as X-ray exams.  TREATMENT  Treatment for your lower back injury depends on many factors that your clinician will have to evaluate. However, most treatment will include the use of anti-inflammatory medicines. HOME CARE INSTRUCTIONS   Avoid hard physical activities (tennis, racquetball, waterskiing) if you are not in proper physical condition for it. This may aggravate or create problems.  If you have a back problem, avoid sports requiring sudden body movements. Swimming and walking are generally safer  activities.  Maintain good posture.  Maintain a healthy weight.  For acute conditions, you may put ice on the injured area.  Put ice in a plastic bag.  Place a towel between your skin and the bag.  Leave the ice on for 20 minutes, 2-3 times a day.  When the low back starts healing, stretching and strengthening exercises may be recommended. SEEK MEDICAL CARE IF:  Your back pain is getting worse.  You experience severe back pain not relieved with medicines. SEEK IMMEDIATE MEDICAL CARE IF:   You have numbness, tingling, weakness, or problems with the use of your arms or legs.  There is a change in bowel or bladder control.  You have increasing pain in any area of the body, including your belly (abdomen).  You notice shortness of breath, dizziness, or feel faint.  You feel sick to your stomach (nauseous), are throwing up (vomiting), or become sweaty.  You notice discoloration of your toes or legs, or your feet get very cold. MAKE SURE YOU:   Understand these instructions.  Will watch your condition.  Will get help right away if you are not doing well or get worse.   This information is not intended to replace advice given to you by your health care provider. Make sure you discuss any questions you have with your health care provider.   Document Released: 03/31/2005 Document Revised: 07/12/2014 Document Reviewed: 02/07/2013 Elsevier Interactive Patient Education [employer].

## 2016-01-13 NOTE — ED Notes (Signed)
Pt in via triage with complaints of back pain x 2 weeks ago, pt seen at urgent care with xrays done, pt reports back pain reoccurring since yesterday.  Pt denies any recent injury.  Pt ambulatory into room, A/Ox4, vitals WDL, no immediate distress at this time.

## 2016-01-14 NOTE — ED Provider Notes (Signed)
[facility] Emergency Department Provider Note ____________________________________________  Time seen: 1745  I have reviewed the triage vital signs and the nursing notes.  HISTORY  Chief Complaint  Back Pain  HPI Carl [name]. is a 45 y.o. male presents to the ED for evaluation of a 2 week complaint of intermittent back pain. The patient was seen at the urgent care about 2 weeks prior and had x-rays done of his thoracic spine. He reports and recalls being prescribed Lodine, but recognizes now that the pharmacy did not dispense the medications. He is not clear why the patient was not dispensed. Since that time he does report his cough has somewhat improved. He denies any interim fevers, chills, or sweats. He denies any flank pain or distal paresthesias. He reports the pain is worse with prolonged sitting and is most recent flare was when turning over in bed.He rates his pain at a 10/10 in triage and describes the pain as sharp in nature.  Past Medical History  Diagnosis Date  . Schizophrenia ([place])   . Recurrent boils     scalp behind ear  . Soft tissue mass     left scalp posterior  . Nevus   . Decreased visual acuity   . Polyuria   . Abdominal pain   . Constipation     Patient Active Problem List   Diagnosis Date Noted  . Infected postoperative seroma 02/27/2015    Past Surgical History  Procedure Laterality Date  . Mass excision Left 02/11/2015    Procedure: EXCISION MASS, left post neck;  Surgeon: Florene Glen, MD;  Location: [place];  Service: General;  Laterality: Left;    Current Outpatient Rx  Name  Route  Sig  Dispense  Refill  . benztropine (COGENTIN) 0.5 MG tablet   Oral   Take 0.5 mg by mouth 2 (two) times daily.         . cloZAPine (CLOZARIL) 100 MG tablet   Oral   Take 300 mg by mouth daily.         . cyclobenzaprine (FLEXERIL) 5 MG tablet   Oral   Take 1 tablet (5 mg total) by mouth 3 (three) times daily as  needed for muscle spasms.   15 tablet   0   . divalproex (DEPAKOTE ER) 500 MG 24 hr tablet   Oral   Take 500 mg by mouth daily.         . naproxen (EC NAPROSYN) 500 MG EC tablet   Oral   Take 1 tablet (500 mg total) by mouth 2 (two) times daily with a meal.   30 tablet   0   . sulfamethoxazole-trimethoprim (BACTRIM DS,SEPTRA DS) 800-160 MG per tablet   Oral   Take 1 tablet by mouth 2 (two) times daily.   14 tablet   0    Allergies Review of patient's allergies indicates no known allergies.  Family History  Problem Relation Age of Onset  . Hyperlipidemia Mother   . Hypertension Father   . Diabetes Father     Social History Social History  Substance Use Topics  . Smoking status: Never Smoker   . Smokeless tobacco: Never Used  . Alcohol Use: No   Review of Systems  Constitutional: Negative for fever. Cardiovascular: Negative for chest pain. Respiratory: Negative for shortness of breath. Gastrointestinal: Negative for abdominal pain, vomiting and diarrhea. Genitourinary: Negative for dysuria. Musculoskeletal: Positive for back pain. Neurological: Negative for headaches, focal weakness or  numbness. ____________________________________________  PHYSICAL EXAM:  VITAL SIGNS: ED Triage Vitals  Enc Vitals Group     BP 01/13/16 1720 123/77 mmHg     Pulse Rate 01/13/16 1720 99     Resp 01/13/16 1720 16     Temp 01/13/16 1720 97.7 F (36.5 C)     Temp Source 01/13/16 1720 Oral     SpO2 01/13/16 1720 99 %     Weight 01/13/16 1720 158 lb (71.668 kg)     Height 01/13/16 1720 5\' 8"  (1.727 m)     Head Cir --      Peak Flow --      Pain Score 01/13/16 1721 10     Pain Loc --      Pain Edu? --      Excl. in [place]? --    Constitutional: Alert and oriented. Well appearing and in no distress. Head: Normocephalic and atraumatic. Cardiovascular: Normal rate, regular rhythm.  Respiratory: Normal respiratory effort. No wheezes/rales/rhonchi. Gastrointestinal: Soft and  nontender. No distention. Musculoskeletal: Patient with normal spinal alignment without midline tenderness, spasm, deformity, or step-off. He is able to transition from sit to stand without difficulty. He is able to demonstrate normal lumbar flexion and extension range without crepitus. He has a negative seated straight leg raise. Nontender with normal range of motion in all extremities.  Neurologic: Cranial nerves II through XII grossly intact. Normal LE DTRs bilaterally. Normal gait without ataxia. Normal speech and language. No gross focal neurologic deficits are appreciated. Skin:  Skin is warm, dry and intact. No rash noted. ____________________________________________  INITIAL IMPRESSION / ASSESSMENT AND PLAN / ED COURSE  Patient with what appears be a lumbar sacral strain without evidence of neuromuscular deficit. He is discharged with prescriptions for EC Naprosyn and Flexeril doses directed. He should follow with his primary care provider for ongoing symptom management. Return precautions are reviewed. ____________________________________________  FINAL CLINICAL IMPRESSION(S) / ED DIAGNOSES  Final diagnoses:  Lumbar strain, initial encounter     Melvenia Needles, PA-C 01/14/16 0010  Rudene Re, MD 01/14/16 1236

## 2016-02-16 ENCOUNTER — Emergency Department: Payer: Medicare Other

## 2016-02-16 ENCOUNTER — Encounter: Payer: Self-pay | Admitting: Emergency Medicine

## 2016-02-16 ENCOUNTER — Emergency Department
Admission: EM | Admit: 2016-02-16 | Discharge: 2016-02-16 | Disposition: A | Discharge status: Home / Self Care | Payer: Medicare Other | Attending: Emergency Medicine | Admitting: Emergency Medicine

## 2016-02-16 DIAGNOSIS — R52 Pain, unspecified: Secondary | ICD-10-CM | POA: Diagnosis present

## 2016-02-16 DIAGNOSIS — R42 Dizziness and giddiness: Secondary | ICD-10-CM | POA: Diagnosis not present

## 2016-02-16 DIAGNOSIS — M545 Low back pain, unspecified: Secondary | ICD-10-CM

## 2016-02-16 DIAGNOSIS — R05 Cough: Secondary | ICD-10-CM | POA: Diagnosis not present

## 2016-02-16 DIAGNOSIS — R059 Cough, unspecified: Secondary | ICD-10-CM

## 2016-02-16 MED ORDER — POLYETHYLENE GLYCOL 3350 17 G PO PACK: 17.0000 g | PACK | Freq: Every day | ORAL | 0 refills | Status: DC

## 2016-02-16 MED ORDER — BENZONATATE 100 MG PO CAPS: 100.0000 mg | ORAL_CAPSULE | Freq: Four times a day (QID) | ORAL | 0 refills | Status: DC | PRN

## 2016-02-16 MED ORDER — LIDOCAINE 5 % EX PTCH
1.0000 | MEDICATED_PATCH | CUTANEOUS | Status: DC
  Administered 2016-02-16: 1 via TRANSDERMAL
  Filled 2016-02-16: qty 1

## 2016-02-16 MED ORDER — LIDOCAINE 5 % EX PTCH: 1.0000 | MEDICATED_PATCH | CUTANEOUS | 0 refills | Status: DC

## 2016-02-16 NOTE — ED Provider Notes (Signed)
[facility] Emergency Department Provider Note   ____________________________________________   First MD Initiated Contact with Patient 02/16/16 0319     (approximate)  I have reviewed the triage vital signs and the nursing notes.   HISTORY  Chief Complaint Cough and Generalized Body Aches    HPI Carl [place]. is a 45 y.o. male who comes into the hospital today with a cough. The patient reports that he has been sick for 3-4 days but his mother reports he has been sick for the past week. He also reports that his back has been bothering him which sounds acute been going on for a few months. The patient reports he had some small pills that were cough medicine from June 30 that he has been taking. She reports that he has been laying down and says he feels as though he has the flu. The patient has not had any fever and his cough is nonproductive. The patient has had a runny nose and has had some posttussive nausea but no actual vomiting. The patient denies any shortness of breath or chest pain but has had some dizziness a few days ago. The patient's mother reports that the last time he had a cough like this he had pneumonia so she decided to have him come in for evaluation. He has not seen his primary care physician for this cough. She reports that he was seen at urgent care but again reports that this was months ago. She reports that he has not received any blood work as well.   Past Medical History:  Diagnosis Date  . Abdominal pain   . Constipation   . Decreased visual acuity   . Nevus   . Polyuria   . Recurrent boils    scalp behind ear  . Schizophrenia ([place])   . Soft tissue mass    left scalp posterior    Patient Active Problem List   Diagnosis Date Noted  . Infected postoperative seroma 02/27/2015    Past Surgical History:  Procedure Laterality Date  . MASS EXCISION Left 02/11/2015   Procedure: EXCISION MASS, left post neck;  Surgeon: Florene Glen, MD;  Location: [place];  Service: General;  Laterality: Left;    Prior to Admission medications   Medication Sig Start Date End Date Taking? Authorizing Provider  benzonatate (TESSALON PERLES) 100 MG capsule Take 1 capsule (100 mg total) by mouth every 6 (six) hours as needed for cough. 02/16/16   Loney Hering, MD  benztropine (COGENTIN) 0.5 MG tablet Take 0.5 mg by mouth 2 (two) times daily.    Historical Provider, MD  cloZAPine (CLOZARIL) 100 MG tablet Take 300 mg by mouth daily.    Historical Provider, MD  cyclobenzaprine (FLEXERIL) 5 MG tablet Take 1 tablet (5 mg total) by mouth 3 (three) times daily as needed for muscle spasms. 01/13/16   Jenise V Bacon Menshew, PA-C  divalproex (DEPAKOTE ER) 500 MG 24 hr tablet Take 500 mg by mouth daily.    Historical Provider, MD  lidocaine (LIDODERM) 5 % Place 1 patch onto the skin daily. Remove & Discard patch within 12 hours or as directed by MD 02/16/16   Loney Hering, MD  naproxen (EC NAPROSYN) 500 MG EC tablet Take 1 tablet (500 mg total) by mouth 2 (two) times daily with a meal. 01/13/16   Jenise V Bacon Menshew, PA-C  polyethylene glycol (MIRALAX) packet Take 17 g by mouth daily. 02/16/16   Ebony Hail  Ephriam Jenkins, MD  sulfamethoxazole-trimethoprim (BACTRIM DS,SEPTRA DS) 800-160 MG per tablet Take 1 tablet by mouth 2 (two) times daily. 02/27/15   Clayburn Pert, MD    Allergies Review of patient's allergies indicates no known allergies.  Family History  Problem Relation Age of Onset  . Hyperlipidemia Mother   . Hypertension Father   . Diabetes Father     Social History Social History  Substance Use Topics  . Smoking status: Never Smoker  . Smokeless tobacco: Never Used  . Alcohol use No    Review of Systems Constitutional: No fever/chills Eyes: No visual changes. ENT: No sore throat. Cardiovascular: Denies chest pain. Respiratory: Cough with no shortness of breath. Gastrointestinal: Nausea with mild  abdominal pain.  no vomiting.  No diarrhea.  No constipation. Genitourinary: Negative for dysuria. Musculoskeletal: Negative for back pain. Skin: Negative for rash. Neurological: Dizziness  10-point ROS otherwise negative.  ____________________________________________   PHYSICAL EXAM:  VITAL SIGNS: ED Triage Vitals  Enc Vitals Group     BP 02/16/16 0132 124/77     Pulse Rate 02/16/16 0132 (!) 102     Resp 02/16/16 0132 18     Temp 02/16/16 0132 97.5 F (36.4 C)     Temp Source 02/16/16 0132 Oral     SpO2 02/16/16 0132 98 %     Weight 02/16/16 0132 158 lb (71.7 kg)     Height 02/16/16 0132 5\' 8"  (1.727 m)     Head Circumference --      Peak Flow --      Pain Score 02/16/16 0133 10     Pain Loc --      Pain Edu? --      Excl. in [place]? --     Constitutional: Alert and oriented. Well appearing and in no acute distress. Eyes: Conjunctivae are normal. PERRL. EOMI. Head: Atraumatic. Nose: No congestion/rhinnorhea. Mouth/Throat: Mucous membranes are moist.  Oropharynx non-erythematous. Cardiovascular: Normal rate, regular rhythm. Grossly normal heart sounds.  Good peripheral circulation. Respiratory: Normal respiratory effort.  No retractions. Lungs CTAB. Gastrointestinal: Soft and nontender. No distention. Positive bowel sounds Musculoskeletal: No lower extremity tenderness nor edema.   Neurologic:  Normal speech and language.  Skin:  Skin is warm, dry and intact.  Psychiatric: Mood and affect are normal.   ____________________________________________   LABS (all labs ordered are listed, but only abnormal results are displayed)  Labs Reviewed - No data to display ____________________________________________  EKG  none ____________________________________________  RADIOLOGY  CXR Lumbar spine xray ____________________________________________   PROCEDURES  Procedure(s) performed: None  Procedures  Critical Care performed:  No  ____________________________________________   INITIAL IMPRESSION / ASSESSMENT AND PLAN / ED COURSE  Pertinent labs & imaging results that were available during my care of the patient were reviewed by me and considered in my medical decision making (see chart for details).  This is a 45 year old male who comes into the hospital today with a cough and some back pain. The patient did not call follow was in the room with him but I will send him for a chest x-ray. I will also do an x-ray of his lumbar spine as he was complaining of some low back discomfort. He had no significant tenderness to palpation. I will reassess the patient once I received these results. I will also place a Lidoderm patch of the patient's back.  Clinical Course  Value Comment By Time  DG Chest 2 View No active cardiopulmonary disease Loney Hering, MD 08/14 [phone]  DG Lumbar Spine 2-3 Views No radiographic evidence for acute abnormality within the lumbar spine. 2. 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 3. Moderate amount of retained stool within the visualized colon, suggesting constipation. Loney Hering, MD 08/14 0502   The patient had a Lidoderm patch placed on his back. At this time he is not in any severe distress. He will be discharged home to follow-up with his primary care physician. I discussed this with the patient and his family and they have no further complaints or concerns. Loney Hering, MD 08/14 0503     ____________________________________________   FINAL CLINICAL IMPRESSION(S) / ED DIAGNOSES  Final diagnoses:  Cough  Midline low back pain without sciatica      NEW MEDICATIONS STARTED DURING THIS VISIT:  New Prescriptions   BENZONATATE (TESSALON PERLES) 100 MG CAPSULE    Take 1 capsule (100 mg total) by mouth every 6 (six) hours as needed for cough.   LIDOCAINE (LIDODERM) 5 %    Place 1 patch onto the skin daily. Remove & Discard patch within 12  hours or as directed by MD   POLYETHYLENE GLYCOL (MIRALAX) PACKET    Take 17 g by mouth daily.     Note:  This document was prepared using Dragon voice recognition software and may include unintentional dictation errors.    Loney Hering, MD 02/16/16 [phone]

## 2016-02-16 NOTE — ED Triage Notes (Signed)
Pt presents to ED with c/o cough "for a while" and body aches. Pt alert and calm at this time with on increased work of breathing or acute distress noted. Ambulatory with a steady gait. Denies fever. Pt states cough not productive. No cough noted in triage.

## 2016-02-16 NOTE — ED Notes (Signed)
Discharge instructions reviewed with patient. Patient verbalized understanding. Patient ambulated to lobby without difficulty.   

## 2016-08-29 IMAGING — CR DG CHEST 2V
2 series · 2 of 2 positions shown · non-contrast
Comparison: Prior radiograph from 09/28/2005.

CLINICAL DATA: Initial evaluation for nonproductive cough for
several weeks.

EXAM:
CHEST  2 VIEW

[chest pa]
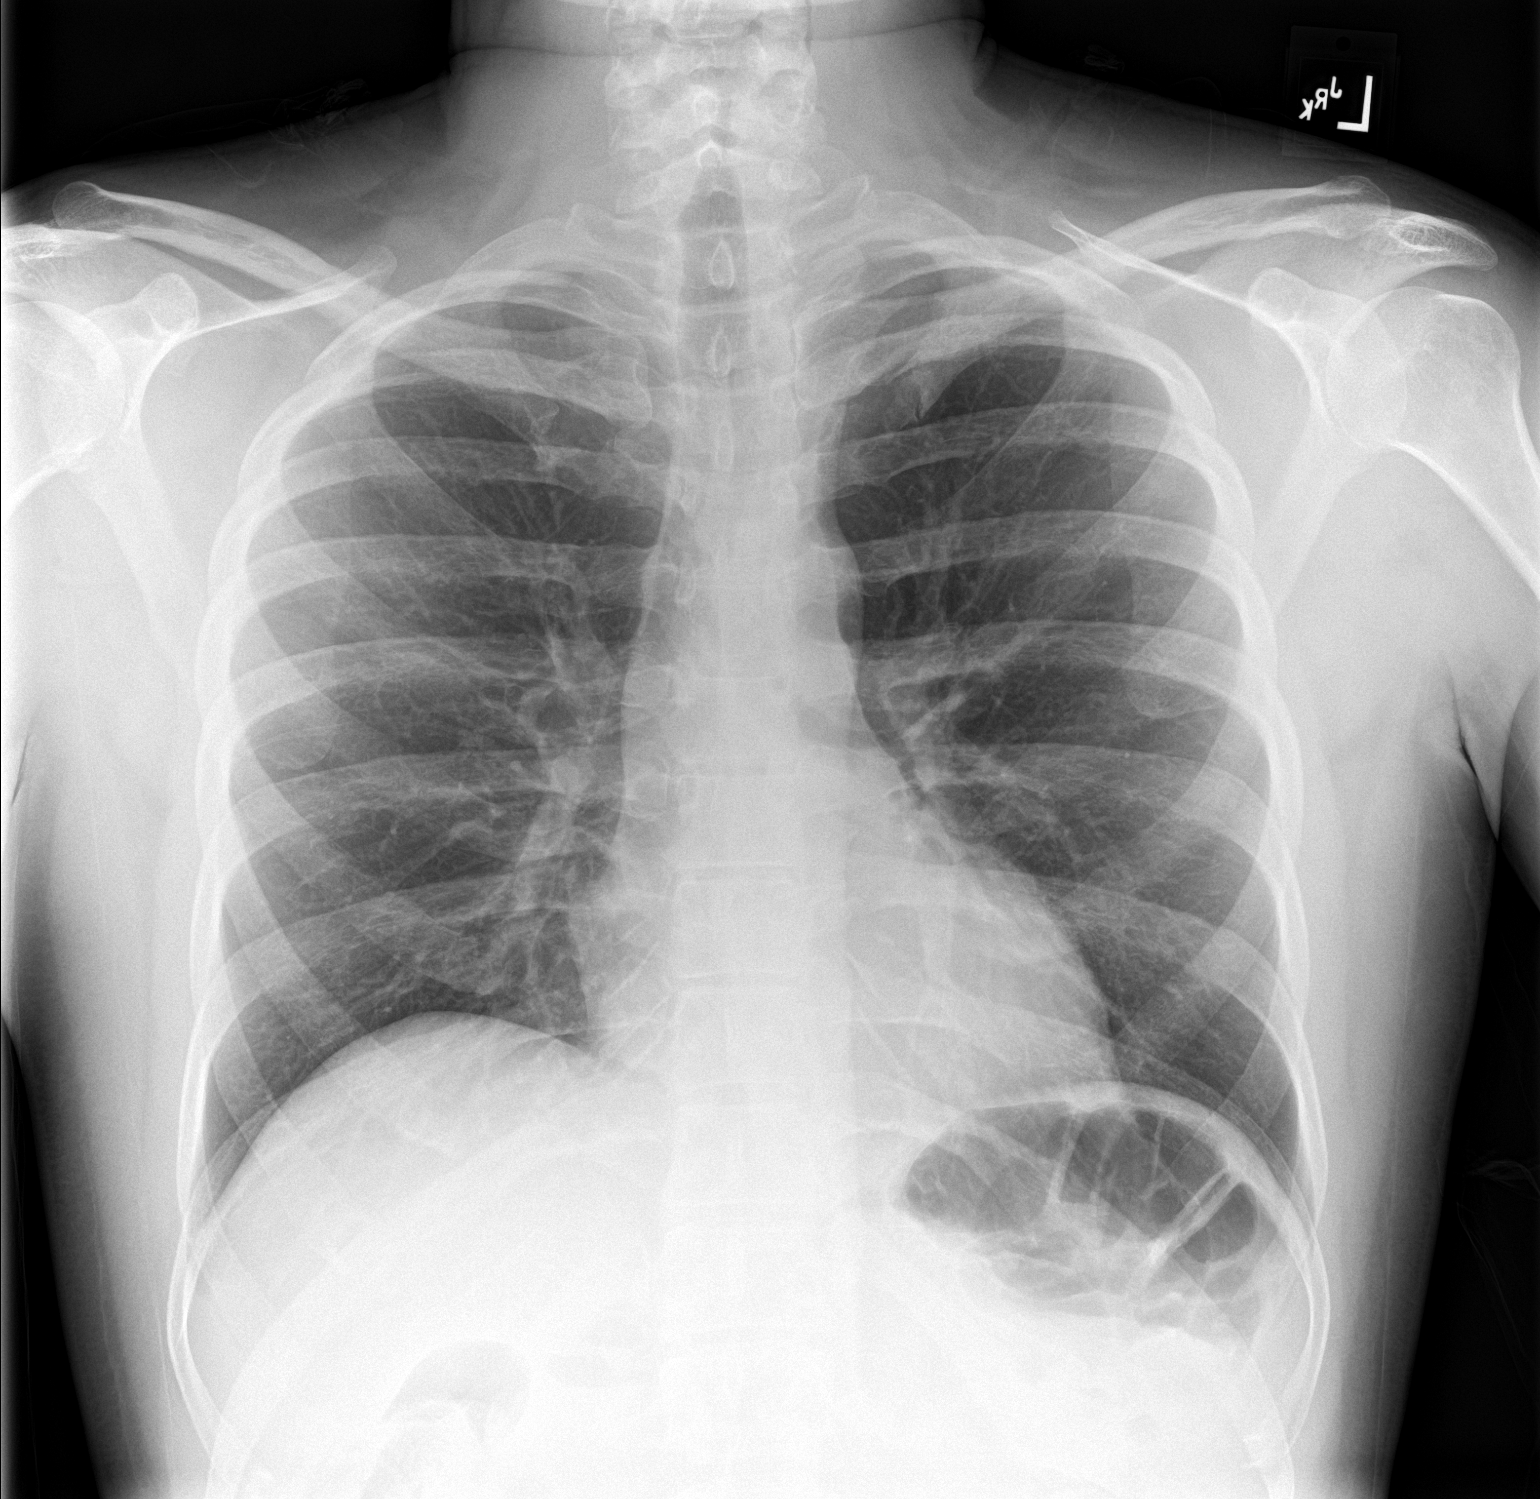

[chest lat]
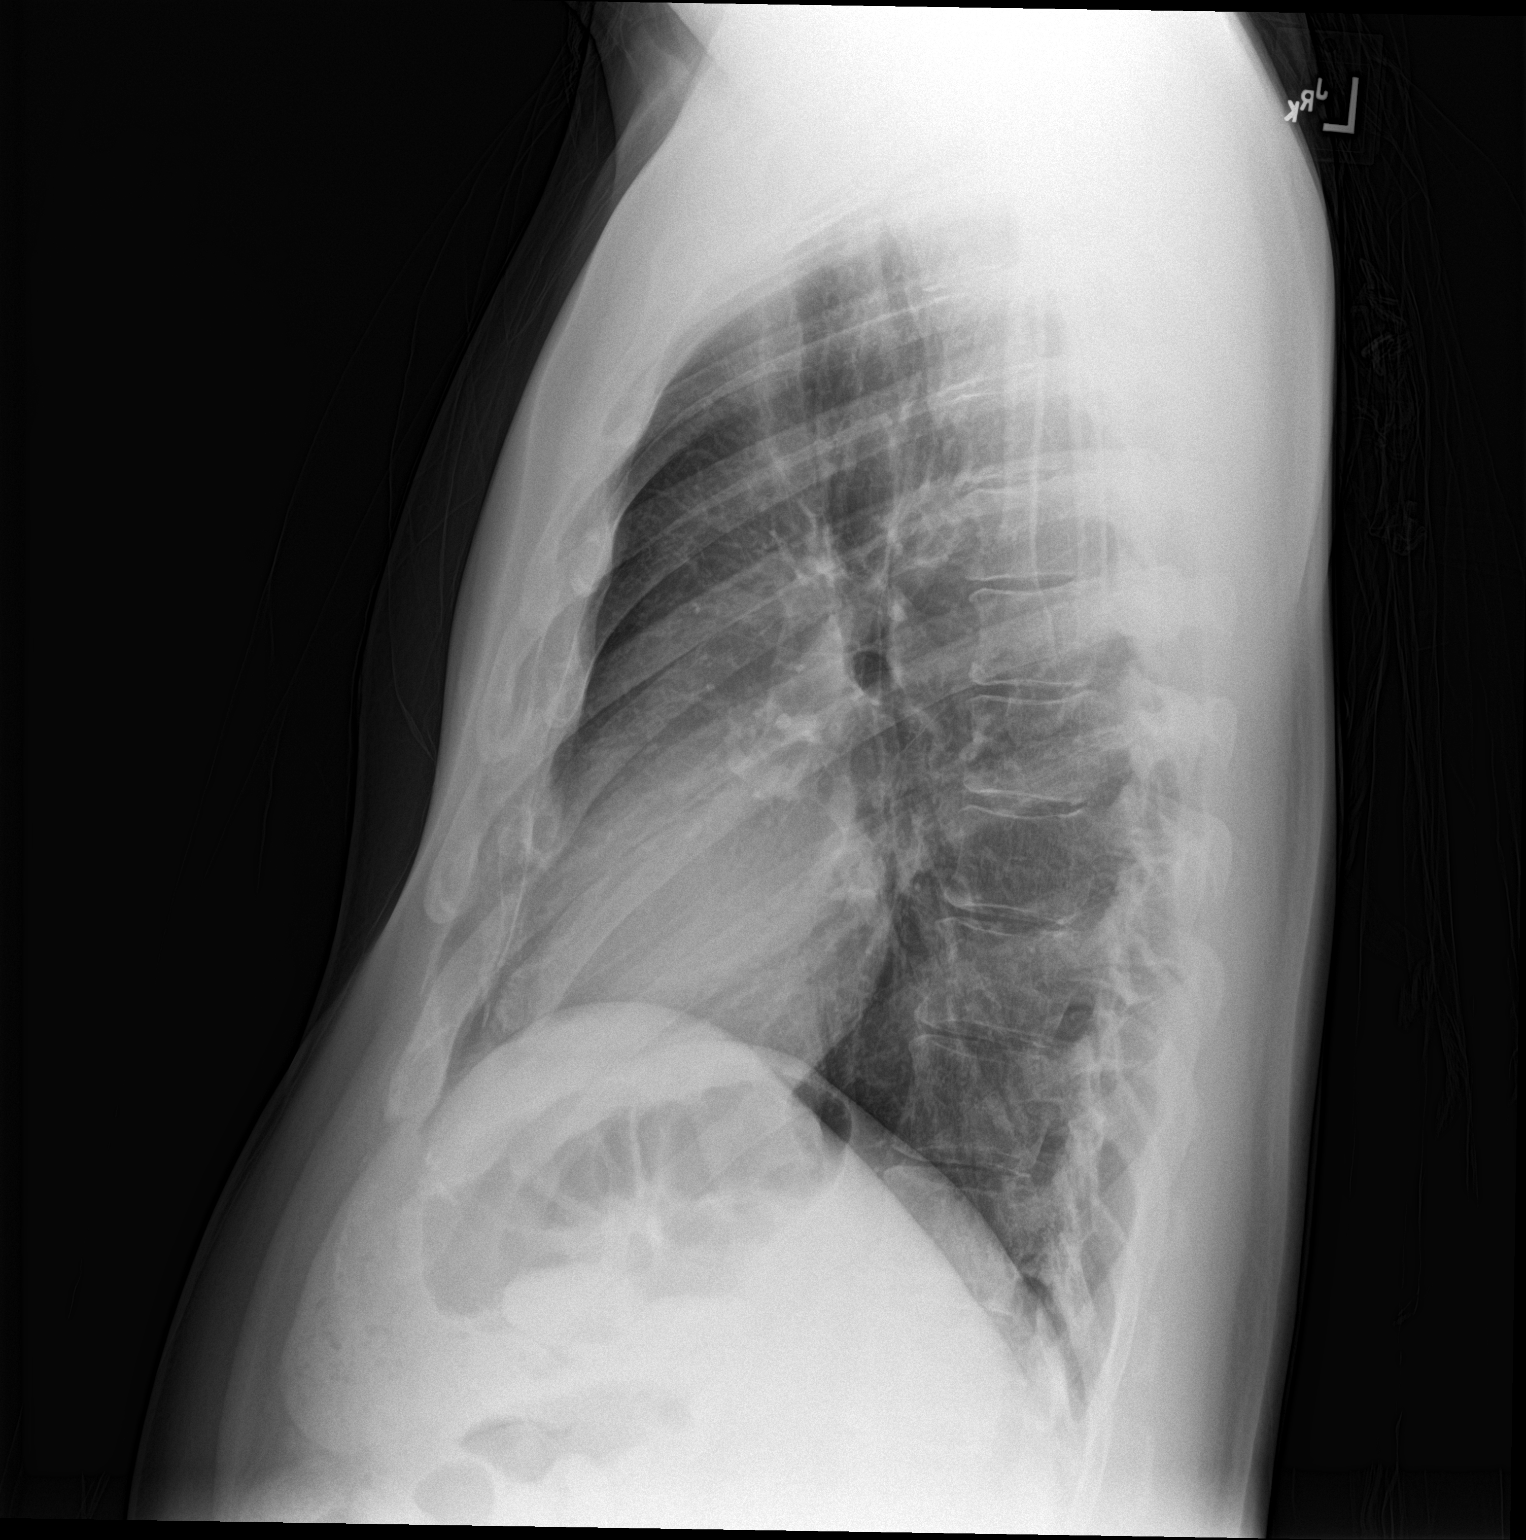

[2 of 2 positions shown; findings below may reference images not displayed]

FINDINGS: The cardiac and mediastinal silhouettes are stable in size and
contour, and remain within normal limits.

The lungs are normally inflated. No airspace consolidation, pleural
effusion, or pulmonary edema is identified. There is no
pneumothorax.

No acute osseous abnormality identified.
IMPRESSION: No active cardiopulmonary disease.

## 2017-09-04 ENCOUNTER — Encounter: Payer: Self-pay | Admitting: Medical Oncology

## 2017-09-04 ENCOUNTER — Emergency Department
Admission: EM | Admit: 2017-09-04 | Discharge: 2017-09-05 | Disposition: A | Discharge status: Other Acute Inpatient Hospital | Payer: Medicare Other | Attending: Emergency Medicine | Admitting: Emergency Medicine

## 2017-09-04 DIAGNOSIS — F209 Schizophrenia, unspecified: Secondary | ICD-10-CM | POA: Insufficient documentation

## 2017-09-04 DIAGNOSIS — Z79899 Other long term (current) drug therapy: Secondary | ICD-10-CM | POA: Insufficient documentation

## 2017-09-04 DIAGNOSIS — F29 Unspecified psychosis not due to a substance or known physiological condition: Secondary | ICD-10-CM

## 2017-09-04 LAB — COMPREHENSIVE METABOLIC PANEL
ALK PHOS: 43 U/L (ref 38–126)
ALT: 13 U/L — ABNORMAL LOW (ref 17–63)
AST: 22 U/L (ref 15–41)
Albumin: 4.8 g/dL (ref 3.5–5.0)
Anion gap: 9 (ref 5–15)
BILIRUBIN TOTAL: 0.9 mg/dL (ref 0.3–1.2)
BUN: 15 mg/dL (ref 6–20)
CALCIUM: 9.6 mg/dL (ref 8.9–10.3)
CO2: 24 mmol/L (ref 22–32)
Chloride: 107 mmol/L (ref 101–111)
Creatinine, Ser: 1.2 mg/dL (ref 0.61–1.24)
GFR calc Af Amer: >60 mL/min (ref >60)
GFR calc non Af Amer: >60 mL/min (ref >60)
Glucose, Bld: 122 mg/dL — ABNORMAL HIGH (ref 65–99)
Potassium: 4 mmol/L (ref 3.5–5.1)
Sodium: 140 mmol/L (ref 135–145)
Total Protein: 7.8 g/dL (ref 6.5–8.1)

## 2017-09-04 LAB — CBC
HCT: 44.4 % (ref 40.0–52.0)
Hemoglobin: 14.9 g/dL (ref 13.0–18.0)
MCH: 27.2 pg (ref 26.0–34.0)
MCHC: 33.6 g/dL (ref 32.0–36.0)
MCV: 81 fL (ref 80.0–100.0)
Platelets: 270 10*3/uL (ref 150–440)
RBC: 5.48 MIL/uL (ref 4.40–5.90)
RDW: 14.7 % — AB (ref 11.5–14.5)
WBC: 7.1 10*3/uL (ref 3.8–10.6)

## 2017-09-04 LAB — URINE DRUG SCREEN, QUALITATIVE (ARMC ONLY)
Amphetamines, Ur Screen: NOT DETECTED
BARBITURATES, UR SCREEN: NOT DETECTED
BENZODIAZEPINE, UR SCRN: NOT DETECTED
Cannabinoid 50 Ng, Ur ~~LOC~~: NOT DETECTED
Cocaine Metabolite,Ur ~~LOC~~: NOT DETECTED
MDMA (Ecstasy)Ur Screen: NOT DETECTED
Methadone Scn, Ur: NOT DETECTED
OPIATE, UR SCREEN: NOT DETECTED
Phencyclidine (PCP) Ur S: NOT DETECTED
TRICYCLIC, UR SCREEN: NOT DETECTED

## 2017-09-04 LAB — ETHANOL: Alcohol, Ethyl (B): <10 mg/dL (ref <10)

## 2017-09-04 LAB — SALICYLATE LEVEL: Salicylate Lvl: <7 mg/dL (ref 2.8–30.0)

## 2017-09-04 LAB — ACETAMINOPHEN LEVEL: Acetaminophen (Tylenol), Serum: <10 ug/mL — ABNORMAL LOW (ref 10–30)

## 2017-09-04 MED ORDER — OLANZAPINE 10 MG PO TABS
10.0000 mg | ORAL_TABLET | Freq: Two times a day (BID) | ORAL | Status: DC
  Administered 2017-09-04 – 2017-09-05 (×3): 10 mg via ORAL
  Filled 2017-09-04 (×3): qty 1

## 2017-09-04 MED ORDER — LORAZEPAM 2 MG PO TABS
2.0000 mg | ORAL_TABLET | Freq: Once | ORAL | Status: AC
  Administered 2017-09-04: 2 mg via ORAL
  Filled 2017-09-04: qty 1

## 2017-09-04 NOTE — ED Notes (Signed)
Hourly rounding reveals patient sleeping in room. No complaints, stable, in no acute distress. Q15 minute rounds and monitoring via Security Cameras to continue. 

## 2017-09-04 NOTE — ED Triage Notes (Signed)
Pt here via [facility] under IVC with reports that pt was running down the road naked and 911 was called. Pt has recent changes in psych meds per parents. Pt calm upon arrival.

## 2017-09-04 NOTE — ED Provider Notes (Signed)
[facility] Emergency Department Provider Note  ____________________________________________   First MD Initiated Contact with Patient 09/04/17 [phone]     (approximate)  I have reviewed the triage vital signs and the nursing notes.   HISTORY  Chief Complaint Psychiatric Evaluation   HPI Carl [name]. is a 47 y.o. male with a history of schizophrenia who is brought in by police under involuntary commitment this morning after being found running naked down the street.  Please report that the patient recently had a change in his medications which may have induced psychosis.  Patient says that he does not remember running down the street naked this morning or why he was running down the street naked this morning.  Denies any drinking or drug use.  Says that he has intermittent auditory hallucinations but is unable to say exactly what he hears.  Requesting medications to help him calm down.  Past Medical History:  Diagnosis Date  . Abdominal pain   . Constipation   . Decreased visual acuity   . Nevus   . Polyuria   . Recurrent boils    scalp behind ear  . Schizophrenia ([place])   . Soft tissue mass    left scalp posterior    Patient Active Problem List   Diagnosis Date Noted  . Infected postoperative seroma 02/27/2015    Past Surgical History:  Procedure Laterality Date  . MASS EXCISION Left 02/11/2015   Procedure: EXCISION MASS, left post neck;  Surgeon: Florene Glen, MD;  Location: [place];  Service: General;  Laterality: Left;    Prior to Admission medications   Medication Sig Start Date End Date Taking? Authorizing Provider  benzonatate (TESSALON PERLES) 100 MG capsule Take 1 capsule (100 mg total) by mouth every 6 (six) hours as needed for cough. 02/16/16   Loney Hering, MD  benztropine (COGENTIN) 0.5 MG tablet Take 0.5 mg by mouth 2 (two) times daily.    [provider]  cloZAPine (CLOZARIL) 100 MG tablet Take 300  mg by mouth daily.    [provider]  cyclobenzaprine (FLEXERIL) 5 MG tablet Take 1 tablet (5 mg total) by mouth 3 (three) times daily as needed for muscle spasms. 01/13/16   Menshew, Dannielle Karvonen, PA-C  divalproex (DEPAKOTE ER) 500 MG 24 hr tablet Take 500 mg by mouth daily.    [provider]  lidocaine (LIDODERM) 5 % Place 1 patch onto the skin daily. Remove & Discard patch within 12 hours or as directed by MD 02/16/16   Loney Hering, MD  naproxen (EC NAPROSYN) 500 MG EC tablet Take 1 tablet (500 mg total) by mouth 2 (two) times daily with a meal. 01/13/16   Menshew, Dannielle Karvonen, PA-C  polyethylene glycol (MIRALAX) packet Take 17 g by mouth daily. 02/16/16   Loney Hering, MD  sulfamethoxazole-trimethoprim (BACTRIM DS,SEPTRA DS) 800-160 MG per tablet Take 1 tablet by mouth 2 (two) times daily. 02/27/15   Clayburn Pert, MD    Allergies Patient has no known allergies.  Family History  Problem Relation Age of Onset  . Hyperlipidemia Mother   . Hypertension Father   . Diabetes Father     Social History Social History   Tobacco Use  . Smoking status: Never Smoker  . Smokeless tobacco: Never Used  Substance Use Topics  . Alcohol use: No    Alcohol/week: 0.0 oz  . Drug use: No    Review of Systems  Constitutional:  No fever/chills Eyes: No visual changes. ENT: No sore throat. Cardiovascular: Denies chest pain. Respiratory: Denies shortness of breath. Gastrointestinal: No abdominal pain.  No nausea, no vomiting.  No diarrhea.  No constipation. Genitourinary: Negative for dysuria. Musculoskeletal: Negative for back pain. Skin: Negative for rash. Neurological: Negative for headaches, focal weakness or numbness.   ____________________________________________   PHYSICAL EXAM:  VITAL SIGNS: ED Triage Vitals  Enc Vitals Group     BP 09/04/17 0909 124/87     Pulse Rate 09/04/17 0909 100     Resp 09/04/17 0909 18     Temp 09/04/17 0909 98.2  F (36.8 C)     Temp Source 09/04/17 0909 Oral     SpO2 09/04/17 0909 98 %     Weight 09/04/17 0910 158 lb (71.7 kg)     Height 09/04/17 0910 5\' 7"  (1.702 m)     Head Circumference --      Peak Flow --      Pain Score --      Pain Loc --      Pain Edu? --      Excl. in [place]? --     Constitutional: Alert and oriented.  Patient brought in and only his boxer shorts.  Calm and cooperative. Eyes: Conjunctivae are normal.  Head: Atraumatic. Nose: No congestion/rhinnorhea. Mouth/Throat: Mucous membranes are moist.  Neck: No stridor.   Cardiovascular: Normal rate, regular rhythm. Grossly normal heart sounds.   Respiratory: Normal respiratory effort.  No retractions. Lungs CTAB. Gastrointestinal: Soft and nontender. No distention.  Musculoskeletal: No lower extremity tenderness nor edema.  No joint effusions. Neurologic:  Normal speech and language. No gross focal neurologic deficits are appreciated. Skin:  Skin is warm, dry and intact. No rash noted. Psychiatric: Mood and affect are normal. Speech and behavior are normal.  ____________________________________________   LABS (all labs ordered are listed, but only abnormal results are displayed)  Labs Reviewed  CBC - Abnormal; Notable for the following components:      Result Value   RDW 14.7 (*)    All other components within normal limits  COMPREHENSIVE METABOLIC PANEL  ETHANOL  SALICYLATE LEVEL  ACETAMINOPHEN LEVEL  URINE DRUG SCREEN, QUALITATIVE ([facility] ONLY)   ____________________________________________  EKG   ____________________________________________  RADIOLOGY   ____________________________________________   PROCEDURES  Procedure(s) performed:   Procedures  Critical Care performed:   ____________________________________________   INITIAL IMPRESSION / ASSESSMENT AND PLAN / ED COURSE  Pertinent labs & imaging results that were available during my care of the patient were reviewed by me and considered  in my medical decision making (see chart for details).  DDX: Polysubstance abuse, psychosis, schizophrenia, mania, auditory hallucinations, intoxication, renal failure As part of my medical decision making, I reviewed the following data within the Many Notes from prior ED visits  I will uphold the IVC.  Pending psychiatry consultation. ____________________________________________   FINAL CLINICAL IMPRESSION(S) / ED DIAGNOSES  Psychosis    NEW MEDICATIONS STARTED DURING THIS VISIT:  New Prescriptions   No medications on file     Note:  This document was prepared using Dragon voice recognition software and may include unintentional dictation errors.     Orbie Pyo, MD 09/04/17 [phone]

## 2017-09-04 NOTE — ED Notes (Signed)
Hourly rounding reveals patient in room. No complaints, stable, in no acute distress. Q15 minute rounds and monitoring via Security Cameras to continue. 

## 2017-09-04 NOTE — ED Notes (Signed)
Patient asleep in room. No noted distress or abnormal behavior. Will continue 15 minute checks and observation by security cameras for safety. 

## 2017-09-04 NOTE — BH Assessment (Signed)
-  Contacted next of kin, mother, to advise that when a bed is available patient will be moved to the inpatient unit at [facility]

## 2017-09-04 NOTE — BH Assessment (Signed)
Assessment Note  Carl [name]. is an 47 y.o. male  who presented to [facility] ED via police on IVC.  Per IVC report patient was running down the street naked this morning.  Pt. has a history of Schizophrenia and is currently being treated through [facility].  Pt. denies SI/HI. Pt. also denies substance use or history of substance use. Pt. reports having auditory hallucinations but was unclear on what he hears.  Per Pt's mother, the doctor at [facility] has recently changed medication from Clozaril, which he had been taking for 20 years, to Risperdal.  Pt.'s mother also reports he takes Depakote due to a Seizure D/O. Pt.'s mother reports that pt. was first diagnosed with Schizophrenia at 47 y/o, at which time he was hospitalized at [facility].  Pt. lives in the home with his parents and brother and is welcome to return once he is discharged.    Diagnosis: Schizophrenia Past Medical History:  Past Medical History:  Diagnosis Date  . Abdominal pain   . Constipation   . Decreased visual acuity   . Nevus   . Polyuria   . Recurrent boils    scalp behind ear  . Schizophrenia ([place])   . Soft tissue mass    left scalp posterior    Past Surgical History:  Procedure Laterality Date  . MASS EXCISION Left 02/11/2015   Procedure: EXCISION MASS, left post neck;  Surgeon: Florene Glen, MD;  Location: [facility];  Service: General;  Laterality: Left;    Family History:  Family History  Problem Relation Age of Onset  . Hyperlipidemia Mother   . Hypertension Father   . Diabetes Father     Social History:  reports that  has never smoked. he has never used smokeless tobacco. He reports that he does not drink alcohol or use drugs.  Additional Social History:  Alcohol / Drug Use Pain Medications: See MAR Prescriptions: See MAR Over the Counter: See MAR History of alcohol / drug use?: No history of alcohol / drug abuse  CIWA: CIWA-Ar BP: 124/87 Pulse Rate: 100 COWS:     Allergies: No Known Allergies  Home Medications:  (Not in a hospital admission)  OB/GYN Status:  No LMP for male patient.  General Assessment Data Location of Assessment: [facility] ED TTS Assessment: In system Is this a Tele or Face-to-Face Assessment?: Face-to-Face Is this an Initial Assessment or a Re-assessment for this encounter?: Initial Assessment Marital status: Single Is patient pregnant?: No Pregnancy Status: No Living Arrangements: Parent Can pt return to current living arrangement?: Yes Admission Status: Involuntary Is patient capable of signing voluntary admission?: No Referral Source: Self/Family/Friend Insurance type: Medicare  Medical Screening Exam ([place]) Medical Exam completed: Yes  Crisis Care Plan Living Arrangements: Parent Name of Psychiatrist: [place]  Name of Therapist: Engineer, manufacturing systems  Education Status Is patient currently in school?: No Highest grade of school patient has completed: GED  Risk to self with the past 6 months Suicidal Ideation: No Has patient been a risk to self within the past 6 months prior to admission? : No Suicidal Intent: No Has patient had any suicidal intent within the past 6 months prior to admission? : No Is patient at risk for suicide?: No Suicidal Plan?: No Has patient had any suicidal plan within the past 6 months prior to admission? : No Access to Means: No What has been your use of drugs/alcohol within the last 12 months?: None Previous Attempts/Gestures: No  Intentional Self Injurious Behavior: None Family Suicide History: No Recent stressful life event(s): Other (Comment)(Change in medications) Persecutory voices/beliefs?: No Depression: No Substance abuse history and/or treatment for substance abuse?: No Suicide prevention information given to non-admitted patients: Not applicable  Risk to Others within the past 6 months Homicidal Ideation: No Does patient have any lifetime risk of  violence toward others beyond the six months prior to admission? : No Thoughts of Harm to Others: No Current Homicidal Intent: No Current Homicidal Plan: No Access to Homicidal Means: No History of harm to others?: No Assessment of Violence: None Noted Does patient have access to weapons?: No Criminal Charges Pending?: No Does patient have a court date: No Is patient on probation?: No  Psychosis Hallucinations: None noted  Mental Status Report Appearance/Hygiene: In scrubs Eye Contact: Fair Motor Activity: Hyperactivity, Echopraxia Speech: Rapid Level of Consciousness: Alert Mood: Euphoric Affect: Euphoric Anxiety Level: Moderate Thought Processes: Irrelevant Judgement: Impaired Orientation: Place, Person, Situation Obsessive Compulsive Thoughts/Behaviors: None  Cognitive Functioning Concentration: Fair Memory: Recent Intact IQ: Average Insight: Fair Impulse Control: Poor Appetite: Fair Sleep: Decreased Total Hours of Sleep: 5 Vegetative Symptoms: None  ADLScreening [facility] Assessment Services) Patient's cognitive ability adequate to safely complete daily activities?: Yes Patient able to express need for assistance with ADLs?: No Independently performs ADLs?: Yes (appropriate for developmental age)  Prior Inpatient Therapy Prior Inpatient Therapy: Yes Prior Therapy Dates: 2000 Prior Therapy Facilty/Provider(s): [facility] Reason for Treatment: psychosis  Prior Outpatient Therapy Prior Outpatient Therapy: Yes Prior Therapy Facilty/Provider(s): [facility] Reason for Treatment: Medication Management Does patient have an ACCT team?: No Does patient have Intensive In-House Services?  : No Does patient have [facility] services? : No Does patient have P4CC services?: No  ADL Screening (condition at time of admission) Patient's cognitive ability adequate to safely complete daily activities?: Yes Is the patient deaf or have difficulty hearing?: No Does the  patient have difficulty seeing, even when wearing glasses/contacts?: No Does the patient have difficulty concentrating, remembering, or making decisions?: No Patient able to express need for assistance with ADLs?: No Does the patient have difficulty dressing or bathing?: No Independently performs ADLs?: Yes (appropriate for developmental age) Does the patient have difficulty walking or climbing stairs?: No Weakness of Legs: None Weakness of Arms/Hands: None  Home Assistive Devices/Equipment Home Assistive Devices/Equipment: None  Therapy Consults (therapy consults require a physician order) PT Evaluation Needed: No OT Evalulation Needed: No SLP Evaluation Needed: No Abuse/Neglect Assessment (Assessment to be complete while patient is alone) Abuse/Neglect Assessment Can Be Completed: Yes Physical Abuse: Denies Verbal Abuse: Denies Sexual Abuse: Denies Exploitation of patient/patient's resources: Denies Self-Neglect: Denies Values / Beliefs Cultural Requests During Hospitalization: None Spiritual Requests During Hospitalization: None Consults Spiritual Care Consult Needed: No Social Work Consult Needed: No Regulatory affairs officer (For Healthcare) Does Patient Have a Medical Advance Directive?: No Would patient like information on creating a medical advance directive?: No - Patient declined    Additional Information 1:1 In Past 12 Months?: No CIRT Risk: No Elopement Risk: No Does patient have medical clearance?: Yes     Disposition:  Disposition Initial Assessment Completed for this Encounter: Yes Disposition of Patient: Pending Review with psychiatrist  On Site Evaluation by:   Reviewed with Physician:    Ileana Ladd 09/04/2017 1:11 PM

## 2017-09-04 NOTE — ED Notes (Signed)
Pt. Transferred to [facility] from ED to room 20after screening for contraband. Report to include Situation, Background, Assessment and Recommendations from [employer]. Pt. Oriented to unit including Q15 minute rounds as well as the security cameras for their protection. Patient is alert and oriented, warm and dry in no acute distress. Patient denies SI, HI, and AVH. Pt. Encouraged to let me know if needs arise.

## 2017-09-04 NOTE — ED Notes (Signed)
Pt cooperative with staff.  Denies SI/HI.  Endorses AH. Disorganized speech, restless. Per patient and parents his clozapine was discontinued 2 weeks ago.  SOC completed. Pt to be admitted.

## 2017-09-05 ENCOUNTER — Inpatient Hospital Stay
Admission: AD | Admit: 2017-09-05 | Discharge: 2017-09-12 | DRG: 885 | Disposition: A | Discharge status: Home / Self Care | Payer: Medicare Other | Attending: Psychiatry | Admitting: Psychiatry

## 2017-09-05 ENCOUNTER — Encounter: Payer: Self-pay | Admitting: Behavioral Health

## 2017-09-05 ENCOUNTER — Other Ambulatory Visit: Payer: Self-pay

## 2017-09-05 DIAGNOSIS — R569 Unspecified convulsions: Secondary | ICD-10-CM | POA: Diagnosis present

## 2017-09-05 DIAGNOSIS — F250 Schizoaffective disorder, bipolar type: Secondary | ICD-10-CM | POA: Diagnosis present

## 2017-09-05 LAB — CBC WITH DIFFERENTIAL/PLATELET
Basophils Absolute: 0 10*3/uL (ref 0–0.1)
Basophils Relative: 1 %
Eosinophils Absolute: 0.1 10*3/uL (ref 0–0.7)
Eosinophils Relative: 1 %
HEMATOCRIT: 40.8 % (ref 40.0–52.0)
HEMOGLOBIN: 13.5 g/dL (ref 13.0–18.0)
LYMPHS ABS: 2.9 10*3/uL (ref 1.0–3.6)
LYMPHS PCT: 38 %
MCH: 27.1 pg (ref 26.0–34.0)
MCHC: 33.2 g/dL (ref 32.0–36.0)
MCV: 81.6 fL (ref 80.0–100.0)
MONOS PCT: 12 %
Monocytes Absolute: 1 10*3/uL (ref 0.2–1.0)
NEUTROS PCT: 48 %
Neutro Abs: 3.7 10*3/uL (ref 1.4–6.5)
Platelets: 251 10*3/uL (ref 150–440)
RBC: 4.99 MIL/uL (ref 4.40–5.90)
RDW: 14.8 % — ABNORMAL HIGH (ref 11.5–14.5)
WBC: 7.7 10*3/uL (ref 3.8–10.6)

## 2017-09-05 LAB — VALPROIC ACID LEVEL: Valproic Acid Lvl: 39 ug/mL — ABNORMAL LOW (ref 50.0–100.0)

## 2017-09-05 MED ORDER — ACETAMINOPHEN 325 MG PO TABS: 650.0000 mg | ORAL_TABLET | Freq: Four times a day (QID) | ORAL | Status: DC | PRN

## 2017-09-05 MED ORDER — DIVALPROEX SODIUM ER 500 MG PO TB24
ORAL_TABLET | ORAL | Status: AC
  Filled 2017-09-05: qty 1

## 2017-09-05 MED ORDER — DIVALPROEX SODIUM ER 500 MG PO TB24
500.0000 mg | ORAL_TABLET | Freq: Every day | ORAL | Status: DC
  Administered 2017-09-05: 500 mg via ORAL

## 2017-09-05 MED ORDER — ALUM & MAG HYDROXIDE-SIMETH 200-200-20 MG/5ML PO SUSP: 30.0000 mL | ORAL | Status: DC | PRN

## 2017-09-05 MED ORDER — CLOZAPINE 25 MG PO TABS
50.0000 mg | ORAL_TABLET | Freq: Every day | ORAL | Status: DC
  Administered 2017-09-05 – 2017-09-06 (×2): 50 mg via ORAL
  Filled 2017-09-05 (×2): qty 2

## 2017-09-05 MED ORDER — MAGNESIUM HYDROXIDE 400 MG/5ML PO SUSP: 30.0000 mL | Freq: Every day | ORAL | Status: DC | PRN

## 2017-09-05 MED ORDER — OLANZAPINE 10 MG PO TABS
10.0000 mg | ORAL_TABLET | Freq: Two times a day (BID) | ORAL | Status: DC
  Administered 2017-09-05 – 2017-09-09 (×8): 10 mg via ORAL
  Filled 2017-09-05 (×8): qty 1

## 2017-09-05 MED ORDER — DIVALPROEX SODIUM 500 MG PO DR TAB
500.0000 mg | DELAYED_RELEASE_TABLET | Freq: Three times a day (TID) | ORAL | Status: DC
  Administered 2017-09-05 – 2017-09-12 (×20): 500 mg via ORAL
  Filled 2017-09-05 (×20): qty 1

## 2017-09-05 NOTE — BH Assessment (Signed)
Pt was accepted into [facility] BMU  Room: 323 Attending: Dr. Weber Cooks  Accepting: Dr. Mamie Nick Report: [phone]

## 2017-09-05 NOTE — BHH Suicide Risk Assessment (Signed)
[facility] Admission Suicide Risk Assessment   Nursing information obtained from:  Patient Demographic factors:  Male Current Mental Status:  NA Loss Factors:  NA Historical Factors:  NA Risk Reduction Factors:  Positive social support, Positive therapeutic relationship, Positive coping skills or problem solving skills  Total Time spent with patient: 1 hour Principal Problem: Schizoaffective disorder, bipolar type without good prognostic features ([place]) Diagnosis:   Patient Active Problem List   Diagnosis Date Noted  . Schizoaffective disorder, bipolar type without good prognostic features ([place]) [F25.0] 09/05/2017    Priority: High  . Seizures ([place]) [R56.9] 09/05/2017  . Infected postoperative seroma [IMO0002] 02/27/2015   Subjective Data: psychotic break  Continued Clinical Symptoms:  Alcohol Use Disorder Identification Test Final Score (AUDIT): 0 The "Alcohol Use Disorders Identification Test", Guidelines for Use in Primary Care, Second Edition.  World Pharmacologist [facility]). Score between 0-7:  no or low risk or alcohol related problems. Score between 8-15:  moderate risk of alcohol related problems. Score between 16-19:  high risk of alcohol related problems. Score 20 or above:  warrants further diagnostic evaluation for alcohol dependence and treatment.   CLINICAL FACTORS:   Schizophrenia:   Command hallucinatons   Musculoskeletal: Strength & Muscle Tone: within normal limits Gait & Station: normal Patient leans: N/A  Psychiatric Specialty Exam: Physical Exam  Nursing note and vitals reviewed. Psychiatric: Thought content normal. His mood appears anxious. His speech is slurred. He is actively hallucinating. Cognition and memory are impaired. He expresses impulsivity.    Review of Systems  Neurological: Positive for seizures.  Psychiatric/Behavioral: Positive for hallucinations.  All other systems reviewed and are negative.   Blood pressure 121/81, pulse 91,  temperature 98.6 F (37 C), temperature source Oral, resp. rate 16, height 5\' 7"  (1.702 m), weight 68.9 kg (152 lb), SpO2 100 %.Body mass index is 23.81 kg/m.  General Appearance: Casual  Eye Contact:  Good  Speech:  Slurred  Volume:  Normal  Mood:  Anxious  Affect:  Blunt  Thought Process:  Disorganized and Descriptions of Associations: Tangential  Orientation:  Full (Time, Place, and Person)  Thought Content:  Hallucinations: Auditory  Suicidal Thoughts:  No  Homicidal Thoughts:  No  Memory:  Immediate;   Poor Recent;   Poor Remote;   Poor  Judgement:  Poor  Insight:  Lacking  Psychomotor Activity:  Normal  Concentration:  Concentration: Poor and Attention Span: Poor  Recall:  Poor  Fund of Knowledge:  Poor  Language:  Poor  Akathisia:  No  Handed:  Right  AIMS (if indicated):     Assets:  Communication Skills Desire for Improvement Financial Resources/Insurance Housing Physical Health Resilience Social Support  ADL's:  Intact  Cognition:  WNL  Sleep:         COGNITIVE FEATURES THAT CONTRIBUTE TO RISK:  None    SUICIDE RISK:   Moderate:  Frequent suicidal ideation with limited intensity, and duration, some specificity in terms of plans, no associated intent, good self-control, limited dysphoria/symptomatology, some risk factors present, and identifiable protective factors, including available and accessible social support.  PLAN OF CARE: hospital admission, medication management, discharge planning.  Mr. Schifano is a 47 year old male with a history of schizophrenia admitted for psychotic break in the context of recent medication adjustment. The patient has been on Clozapine for schizophrenia and Depakote for seizures for many years according to his family.  #Psychosis -started on Zyprexa 20 mg nightly -restart Clozapine 50 mg   #Seizures -Depakote 500 mg  TID  #Metabolic syndrome monitoring -lipid panel, TSH and HgbA1C are pending -EKG,  pending  #Disposition -discharge with family -follow up with [facility]  I certify that inpatient services furnished can reasonably be expected to improve the patient's condition.   Orson Slick, MD 09/05/2017, 7:36 PM

## 2017-09-05 NOTE — ED Notes (Signed)
Pts father here for visit.

## 2017-09-05 NOTE — ED Notes (Signed)
Hourly rounding reveals patient sleeping in room. No complaints, stable, in no acute distress. Q15 minute rounds and monitoring via Security Cameras to continue. 

## 2017-09-05 NOTE — ED Notes (Signed)
Pts mother here to visit. Pt calm and cooperative.

## 2017-09-05 NOTE — ED Notes (Signed)
Spoke with pts mother because she is concerned about pts medications. This RN called [facility] and spoke with nurse to get list of medications. Pt is supposed to be on depakote ER 500mg  at bedtime per nurse and mother. Spoke with Dr Burlene Arnt and pt to be placed back on med.

## 2017-09-05 NOTE — Progress Notes (Signed)
Patient is alert and oriented X 4. Patient denies SI, HI and AVH. Patient states he is here because his medication (Clozaril for 25 years) was recently changed to Depakote and risperidone and he had a bad reaction. Patient states, "I was running down the street naked, but I don't remember." Patient states he does hear voices but he always hears voices because he was diagnosis with schizophrenia in the 90's.  Patient denies any substance abuse, not a smoker. Skin assessment completed with no contraband found; Patient has a cluster of moles on his back; skin intact. Patient is pleasant and compliant. Patient is only concerned about receiving correct medications and medication education. Safety checks will continue Q 15 minutes.

## 2017-09-05 NOTE — Progress Notes (Signed)
Patient ID: Carl [name]., male   DOB: Mar 01, 1971, 47 y.o.   MRN: [id number] PER STATE REGULATIONS 482.30  THIS CHART WAS REVIEWED FOR MEDICAL NECESSITY WITH RESPECT TO THE PATIENT'S ADMISSION/DURATION OF STAY.  NEXT REVIEW DATE: 09/09/17  Roma Schanz, RN, BSN CASE MANAGER

## 2017-09-05 NOTE — Tx Team (Signed)
Initial Treatment Plan 09/05/2017 6:36 PM [name] XBJ:[id number]    PATIENT STRESSORS: Medication change or noncompliance Other: medication recently changed; bad reaction Clozaril discontinued and resperidone and depakote started two weeks ago resulted in bad reaction   PATIENT STRENGTHS: Ability for insight Active sense of humor Motivation for treatment/growth Supportive family/friends   PATIENT IDENTIFIED PROBLEMS: Medication management  MEDICATION EDUCATION  COPING SKILLS                 DISCHARGE CRITERIA:  Improved stabilization in mood, thinking, and/or behavior Safe-care adequate arrangements made  PRELIMINARY DISCHARGE PLAN: Return to previous living arrangement  PATIENT/FAMILY INVOLVEMENT: This treatment plan has been presented to and reviewed with the patient, Carl Tailor., and/or family member.  The patient and family have been given the opportunity to ask questions and make suggestions.  Carl Docker, RN 09/05/2017, 6:36 PM

## 2017-09-05 NOTE — ED Notes (Signed)
Report to oncoming nurses.

## 2017-09-05 NOTE — H&P (Addendum)
Psychiatric Admission Assessment Adult  Patient Identification: Carl [name]. MRN:  [id number] Date of Evaluation:  09/05/2017 Chief Complaint:  schizophrenia Principal Diagnosis: Schizoaffective disorder, bipolar type without good prognostic features ([place]) Diagnosis:   Patient Active Problem List   Diagnosis Date Noted  . Schizoaffective disorder, bipolar type without good prognostic features ([place]) [F25.0] 09/05/2017    Priority: High  . Seizures ([place]) [R56.9] 09/05/2017  . Infected postoperative seroma [IMO0002] 02/27/2015   History of Present Illness:   Identifying data. Carl [name] is a 47 year old mle with a history of schizophrenia.  Chief complaint. The patient unable to state.  History of present illness. Information was obtained from the patient and the chart. The patient was brought to the ER by the police after found running around naked. The patient denies. He does admit to auditory hallucinations but is unable to tell me if they are commands.He denies any other symptoms of depression or anxiety. He is not suicidal or homicidal. He does not use drugs or alcohol. He reports good treatment compliance with medications prescribed by Dr. Loni Muse. At [facility]. He is unable to name his medications. Per his family, the patient has been treated successfully with Clozapine for schizophrenia and Depakote for seizures for many years. Indeed, review of available records from [place] confirms treatment with Clozapine. Clozapine was recently discontinued and Risperdal started by his primary psychiatrist due to "his numbers dropping".   Past psychiatric history. He was diagnosed with schizophrenia at the age of 47. His first hospitalization was at [facility]. There are some records from [facility] in the system. Reportedly did well on Clozapine for 20 years without need for hospitalization. Apparently, there were no suicide attempts.  Family psychiatric history. The patient denies any.  Social history. He is  disabled from mental illness. He lives with his parents and the brother. He will return home following discharge.  Total Time spent with patient: 1 hour  Is the patient at risk to self? No.  Has the patient been a risk to self in the past 6 months? No.  Has the patient been a risk to self within the distant past? No.  Is the patient a risk to others? No.  Has the patient been a risk to others in the past 6 months? No.  Has the patient been a risk to others within the distant past? No.   Prior Inpatient Therapy:   Prior Outpatient Therapy:    Alcohol Screening: 1. How often do you have a drink containing alcohol?: Never 2. How many drinks containing alcohol do you have on a typical day when you are drinking?: 1 or 2 3. How often do you have six or more drinks on one occasion?: Never AUDIT-C Score: 0 4. How often during the last year have you found that you were not able to stop drinking once you had started?: Never 5. How often during the last year have you failed to do what was normally expected from you becasue of drinking?: Never 6. How often during the last year have you needed a first drink in the morning to get yourself going after a heavy drinking session?: Never 7. How often during the last year have you had a feeling of guilt of remorse after drinking?: Never 8. How often during the last year have you been unable to remember what happened the night before because you had been drinking?: Never 9. Have you or someone else been injured as a result of your drinking?: No 10. Has a  relative or friend or a doctor or another health worker been concerned about your drinking or suggested you cut down?: No Alcohol Use Disorder Identification Test Final Score (AUDIT): 0 Intervention/Follow-up: AUDIT Score <7 follow-up not indicated Substance Abuse History in the last 12 months:  No. Consequences of Substance Abuse: NA Previous Psychotropic Medications: Yes  Psychological Evaluations: No   Past Medical History:  Past Medical History:  Diagnosis Date  . Abdominal pain   . Constipation   . Decreased visual acuity   . Nevus   . Polyuria   . Recurrent boils    scalp behind ear  . Schizophrenia ([place])   . Soft tissue mass    left scalp posterior    Past Surgical History:  Procedure Laterality Date  . MASS EXCISION Left 02/11/2015   Procedure: EXCISION MASS, left post neck;  Surgeon: Florene Glen, MD;  Location: [facility];  Service: General;  Laterality: Left;   Family History:  Family History  Problem Relation Age of Onset  . Hyperlipidemia Mother   . Hypertension Father   . Diabetes Father     Tobacco Screening: Have you used any form of tobacco in the last 30 days? (Cigarettes, Smokeless Tobacco, Cigars, and/or Pipes): No Social History:  Social History   Substance and Sexual Activity  Alcohol Use No  . Alcohol/week: 0.0 oz     Social History   Substance and Sexual Activity  Drug Use No    Additional Social History:                           Allergies:  No Known Allergies Lab Results:  Results for orders placed or performed during the hospital encounter of 09/04/17 (from the past 48 hour(s))  Comprehensive metabolic panel     Status: Abnormal   Collection Time: 09/04/17  9:10 AM  Result Value Ref Range   Sodium 140 135 - 145 mmol/L   Potassium 4.0 3.5 - 5.1 mmol/L   Chloride 107 101 - 111 mmol/L   CO2 24 22 - 32 mmol/L   Glucose, Bld 122 (H) 65 - 99 mg/dL   BUN 15 6 - 20 mg/dL   Creatinine, Ser 1.20 0.61 - 1.24 mg/dL   Calcium 9.6 8.9 - 10.3 mg/dL   Total Protein 7.8 6.5 - 8.1 g/dL   Albumin 4.8 3.5 - 5.0 g/dL   AST 22 15 - 41 U/L   ALT 13 (L) 17 - 63 U/L   Alkaline Phosphatase 43 38 - 126 U/L   Total Bilirubin 0.9 0.3 - 1.2 mg/dL   GFR calc non Af Amer >60 >60 mL/min   GFR calc Af Amer >60 >60 mL/min    Comment: (NOTE) The eGFR has been calculated using the CKD EPI equation. This calculation has not been validated  in all clinical situations. eGFR's persistently <60 mL/min signify possible Chronic Kidney Disease.    Anion gap 9 5 - 15    Comment: Performed at [facility], [address] [place], [place] [address]  Ethanol     Status: None   Collection Time: 09/04/17  9:10 AM  Result Value Ref Range   Alcohol, Ethyl (B) <10 <10 mg/dL    Comment:        LOWEST DETECTABLE LIMIT FOR SERUM ALCOHOL IS 10 mg/dL FOR MEDICAL PURPOSES ONLY Performed at [facility], [address], [place], [place] [address]   Salicylate level  Status: None   Collection Time: 09/04/17  9:10 AM  Result Value Ref Range   Salicylate Lvl <1.0 2.8 - 30.0 mg/dL    Comment: Performed at [facility], [address] [place], [place] [address]  Acetaminophen level     Status: Abnormal   Collection Time: 09/04/17  9:10 AM  Result Value Ref Range   Acetaminophen (Tylenol), Serum <10 (L) 10 - 30 ug/mL    Comment:        THERAPEUTIC CONCENTRATIONS VARY SIGNIFICANTLY. A RANGE OF 10-30 ug/mL MAY BE AN EFFECTIVE CONCENTRATION FOR MANY PATIENTS. HOWEVER, SOME ARE BEST TREATED AT CONCENTRATIONS OUTSIDE THIS RANGE. ACETAMINOPHEN CONCENTRATIONS >150 ug/mL AT 4 HOURS AFTER INGESTION AND >50 ug/mL AT 12 HOURS AFTER INGESTION ARE OFTEN ASSOCIATED WITH TOXIC REACTIONS. Performed at [facility], [address] [place], [place] [address]   cbc     Status: Abnormal   Collection Time: 09/04/17  9:10 AM  Result Value Ref Range   WBC 7.1 3.8 - 10.6 K/uL   RBC 5.48 4.40 - 5.90 MIL/uL   Hemoglobin 14.9 13.0 - 18.0 g/dL   HCT 44.4 40.0 - 52.0 %   MCV 81.0 80.0 - 100.0 fL   MCH 27.2 26.0 - 34.0 pg   MCHC 33.6 32.0 - 36.0 g/dL   RDW 14.7 (H) 11.5 - 14.5 %   Platelets 270 150 - 440 K/uL    Comment: Performed at [facility], [address] [place], [place] [address]  Urine Drug Screen, Qualitative     Status: None   Collection Time: 09/04/17  9:11 AM  Result Value Ref Range    Tricyclic, Ur Screen NONE DETECTED NONE DETECTED   Amphetamines, Ur Screen NONE DETECTED NONE DETECTED   MDMA (Ecstasy)Ur Screen NONE DETECTED NONE DETECTED   Cocaine Metabolite,Ur [place] NONE DETECTED NONE DETECTED   Opiate, Ur Screen NONE DETECTED NONE DETECTED   Phencyclidine (PCP) Ur S NONE DETECTED NONE DETECTED   Cannabinoid 50 Ng, Ur [place] NONE DETECTED NONE DETECTED   Barbiturates, Ur Screen NONE DETECTED NONE DETECTED   Benzodiazepine, Ur Scrn NONE DETECTED NONE DETECTED   Methadone Scn, Ur NONE DETECTED NONE DETECTED    Comment: (NOTE) Tricyclics + metabolites, urine    Cutoff 1000 ng/mL Amphetamines + metabolites, urine  Cutoff 1000 ng/mL MDMA (Ecstasy), urine              Cutoff 500 ng/mL Cocaine Metabolite, urine          Cutoff 300 ng/mL Opiate + metabolites, urine        Cutoff 300 ng/mL Phencyclidine (PCP), urine         Cutoff 25 ng/mL Cannabinoid, urine                 Cutoff 50 ng/mL Barbiturates + metabolites, urine  Cutoff 200 ng/mL Benzodiazepine, urine              Cutoff 200 ng/mL Methadone, urine                   Cutoff 300 ng/mL The urine drug screen provides only a preliminary, unconfirmed analytical test result and should not be used for non-medical purposes. Clinical consideration and professional judgment should be applied to any positive drug screen result due to possible interfering substances. A more specific alternate chemical method must be used in order to obtain a confirmed analytical result. Gas chromatography / mass spectrometry (GC/MS) is the preferred confirmat ory method. Performed at  [facility], [address] [place], [place] [address]     Blood Alcohol level:  Lab Results  Component Value Date   ETH <10 16/38/4536    Metabolic Disorder Labs:  No results found for: HGBA1C, MPG No results found for: PROLACTIN No results found for: CHOL, TRIG, HDL, CHOLHDL, VLDL, LDLCALC  Current Medications: Current  Facility-Administered Medications  Medication Dose Route Frequency Provider Last Rate Last Dose  . acetaminophen (TYLENOL) tablet 650 mg  650 mg Oral Q6H PRN Chauncey Mann, MD      . alum & mag hydroxide-simeth (MAALOX/MYLANTA) 200-200-20 MG/5ML suspension 30 mL  30 mL Oral Q4H PRN Chauncey Mann, MD      . cloZAPine (CLOZARIL) tablet 50 mg  50 mg Oral QHS Fordyce Lepak B, MD      . divalproex (DEPAKOTE) DR tablet 500 mg  500 mg Oral Q8H Kenedie Dirocco B, MD      . magnesium hydroxide (MILK OF MAGNESIA) suspension 30 mL  30 mL Oral Daily PRN Chauncey Mann, MD      . OLANZapine (ZYPREXA) tablet 10 mg  10 mg Oral BID Chauncey Mann, MD   10 mg at 09/05/17 1812   PTA Medications: No medications prior to admission.    Musculoskeletal: Strength & Muscle Tone: within normal limits Gait & Station: normal Patient leans: N/A  Psychiatric Specialty Exam: I reviewed physical examination performed in the ER and agree with the findings. Physical Exam  Nursing note and vitals reviewed. Psychiatric: Thought content normal. His affect is blunt. His speech is slurred. He is withdrawn and actively hallucinating. Cognition and memory are impaired. He expresses impulsivity.    Review of Systems  Neurological: Positive for seizures.  Psychiatric/Behavioral: Positive for hallucinations.  All other systems reviewed and are negative.   Blood pressure 121/81, pulse 91, temperature 98.6 F (37 C), temperature source Oral, resp. rate 16, height 5' 7"  (1.702 m), weight 68.9 kg (152 lb), SpO2 100 %.Body mass index is 23.81 kg/m.  See SRA                                                  Sleep:       Treatment Plan Summary: Daily contact with patient to assess and evaluate symptoms and progress in treatment and Medication management   Mr. Holycross is a 47 year old male with a history of schizophrenia admitted for psychotic break in the context of recent medication  adjustment. The patient has been on Clozapine for schizophrenia and Depakote for seizures for many years according to his family.  #Psychosis -started on Zyprexa 20 mg nightly -restart Clozapine 50 mg   #Seizures -Depakote 468 mg TID  #Metabolic syndrome monitoring -lipid panel, TSH and HgbA1C are pending -EKG, pending  #Disposition -discharge with family -follow up with [facility]   Observation Level/Precautions:  15 minute checks  Laboratory:  CBC Chemistry Profile UDS UA  Psychotherapy:    Medications:    Consultations:    Discharge Concerns:    Estimated LOS:  Other:     Physician Treatment Plan for Primary Diagnosis: Schizoaffective disorder, bipolar type without good prognostic features ([place]) Long Term Goal(s): Improvement in symptoms so as ready for discharge  Short Term Goals: Ability to identify changes in lifestyle to reduce recurrence of condition will improve, Ability to verbalize feelings will  improve, Ability to disclose and discuss suicidal ideas, Ability to demonstrate self-control will improve, Ability to identify and develop effective coping behaviors will improve, Ability to maintain clinical measurements within normal limits will improve, Compliance with prescribed medications will improve and Ability to identify triggers associated with substance abuse/mental health issues will improve  Physician Treatment Plan for Secondary Diagnosis: Principal Problem:   Schizoaffective disorder, bipolar type without good prognostic features ([place]) Active Problems:   Seizures ([place])  Long Term Goal(s): NA  Short Term Goals: NA  I certify that inpatient services furnished can reasonably be expected to improve the patient's condition.    Orson Slick, MD 3/4/20198:03 PM

## 2017-09-05 NOTE — ED Notes (Signed)
Pt. showering

## 2017-09-06 LAB — HEMOGLOBIN A1C
Hgb A1c MFr Bld: 5.2 % (ref 4.8–5.6)
Mean Plasma Glucose: 102.54 mg/dL

## 2017-09-06 LAB — LIPID PANEL
CHOL/HDL RATIO: 3.6 ratio
CHOLESTEROL: 172 mg/dL (ref 0–200)
HDL: 48 mg/dL (ref >40)
LDL Cholesterol: 102 mg/dL — ABNORMAL HIGH (ref 0–99)
Triglycerides: 108 mg/dL (ref <150)
VLDL: 22 mg/dL (ref 0–40)

## 2017-09-06 LAB — TSH: TSH: 1.247 u[IU]/mL (ref 0.350–4.500)

## 2017-09-06 MED ORDER — LORATADINE 10 MG PO TABS
10.0000 mg | ORAL_TABLET | Freq: Every day | ORAL | Status: DC
  Administered 2017-09-06 – 2017-09-11 (×6): 10 mg via ORAL
  Filled 2017-09-06 (×7): qty 1

## 2017-09-06 NOTE — Progress Notes (Signed)
[facility] MD Progress Note  09/06/2017 10:02 AM Carl [name].  MRN:  [id number]  Subjective:   Carl [name] provides little information again today but has no complaints except for hallucinations. Slept 6 hours and reports no side effects from medications.  Spoke with his mother who reports that few weeks ago his medications were adjusted. "His numbers were dropping" and the patient experienced seizures. As a result, his Depakote dose was increased to 1500 mg which he could not tolerate and Clozapine was discontinued.  Treatment plan. He was started on Zyprexa in the ER. I re challanged with Clozapine beginning last night as his ANC was 5.5. We restarted Depakote 500 mg TID for seizure prevention.   Social/disposition. Discharge to home with supportive family. Follow up with [facility].  Principal Problem: Schizoaffective disorder, bipolar type without good prognostic features ([place]) Diagnosis:   Patient Active Problem List   Diagnosis Date Noted  . Schizoaffective disorder, bipolar type without good prognostic features ([place]) [F25.0] 09/05/2017    Priority: High  . Seizures ([place]) [R56.9] 09/05/2017  . Infected postoperative seroma [IMO0002] 02/27/2015   Total Time spent with patient: 30 minutes  Past Psychiatric History: schizophrenia.  Past Medical History:  Past Medical History:  Diagnosis Date  . Abdominal pain   . Constipation   . Decreased visual acuity   . Nevus   . Polyuria   . Recurrent boils    scalp behind ear  . Schizophrenia ([place])   . Soft tissue mass    left scalp posterior    Past Surgical History:  Procedure Laterality Date  . MASS EXCISION Left 02/11/2015   Procedure: EXCISION MASS, left post neck;  Surgeon: Florene Glen, MD;  Location: [facility];  Service: General;  Laterality: Left;   Family History:  Family History  Problem Relation Age of Onset  . Hyperlipidemia Mother   . Hypertension Father   . Diabetes Father    Family Psychiatric   History: none reported. Social History:  Social History   Substance and Sexual Activity  Alcohol Use No  . Alcohol/week: 0.0 oz     Social History   Substance and Sexual Activity  Drug Use No    Social History   Socioeconomic History  . Marital status: Single    Spouse name: None  . Number of children: None  . Years of education: None  . Highest education level: None  Social Needs  . Financial resource strain: None  . Food insecurity - worry: None  . Food insecurity - inability: None  . Transportation needs - medical: None  . Transportation needs - non-medical: None  Occupational History  . None  Tobacco Use  . Smoking status: Never Smoker  . Smokeless tobacco: Never Used  Substance and Sexual Activity  . Alcohol use: No    Alcohol/week: 0.0 oz  . Drug use: No  . Sexual activity: None  Other Topics Concern  . None  Social History Narrative  . None   Additional Social History:                         Sleep: Fair  Appetite:  Fair  Current Medications: Current Facility-Administered Medications  Medication Dose Route Frequency Provider Last Rate Last Dose  . acetaminophen (TYLENOL) tablet 650 mg  650 mg Oral Q6H PRN Chauncey Mann, MD      . alum & mag hydroxide-simeth (MAALOX/MYLANTA) 200-200-20 MG/5ML suspension 30 mL  30 mL Oral  Q4H PRN Chauncey Mann, MD      . cloZAPine (CLOZARIL) tablet 50 mg  50 mg Oral QHS Alonnie Bieker B, MD   50 mg at 09/05/17 2124  . divalproex (DEPAKOTE) DR tablet 500 mg  500 mg Oral Q8H Estefany Goebel B, MD   500 mg at 09/06/17 0621  . magnesium hydroxide (MILK OF MAGNESIA) suspension 30 mL  30 mL Oral Daily PRN Chauncey Mann, MD      . OLANZapine [facility]) tablet 10 mg  10 mg Oral BID Chauncey Mann, MD   10 mg at 09/06/17 0900    Lab Results:  Results for orders placed or performed during the hospital encounter of 09/05/17 (from the past 48 hour(s))  Valproic acid level     Status: Abnormal   Collection  Time: 09/05/17  8:26 PM  Result Value Ref Range   Valproic Acid Lvl 39 (L) 50.0 - 100.0 ug/mL    Comment: Performed at [facility], [address] [place], [place] [address]  CBC with Differential/Platelet     Status: Abnormal   Collection Time: 09/05/17  8:26 PM  Result Value Ref Range   WBC 7.7 3.8 - 10.6 K/uL   RBC 4.99 4.40 - 5.90 MIL/uL   Hemoglobin 13.5 13.0 - 18.0 g/dL   HCT 40.8 40.0 - 52.0 %   MCV 81.6 80.0 - 100.0 fL   MCH 27.1 26.0 - 34.0 pg   MCHC 33.2 32.0 - 36.0 g/dL   RDW 14.8 (H) 11.5 - 14.5 %   Platelets 251 150 - 440 K/uL   Neutrophils Relative % 48 %   Neutro Abs 3.7 1.4 - 6.5 K/uL   Lymphocytes Relative 38 %   Lymphs Abs 2.9 1.0 - 3.6 K/uL   Monocytes Relative 12 %   Monocytes Absolute 1.0 0.2 - 1.0 K/uL   Eosinophils Relative 1 %   Eosinophils Absolute 0.1 0 - 0.7 K/uL   Basophils Relative 1 %   Basophils Absolute 0.0 0 - 0.1 K/uL    Comment: Performed at [facility], [address] [place],  [address]  Lipid panel     Status: Abnormal   Collection Time: 09/06/17  7:02 AM  Result Value Ref Range   Cholesterol 172 0 - 200 mg/dL   Triglycerides 108 <150 mg/dL   HDL 48 >40 mg/dL   Total CHOL/HDL Ratio 3.6 RATIO   VLDL 22 0 - 40 mg/dL   LDL Cholesterol 102 (H) 0 - 99 mg/dL    Comment:        Total Cholesterol/HDL:CHD Risk Coronary Heart Disease Risk Table                     Men   Women  1/2 Average Risk   3.4   3.3  Average Risk       5.0   4.4  2 X Average Risk   9.6   7.1  3 X Average Risk  23.4   11.0        Use the calculated Patient Ratio above and the CHD Risk Table to determine the patient's CHD Risk.        ATP III CLASSIFICATION (LDL):  <100     mg/dL   Optimal  100-129  mg/dL   Near or Above                    Optimal  130-159  mg/dL  Borderline  160-189  mg/dL   High  >190     mg/dL   Very High Performed at [facility], [address] [place], [place] [address]   TSH     Status: None    Collection Time: 09/06/17  7:02 AM  Result Value Ref Range   TSH 1.247 0.350 - 4.500 uIU/mL    Comment: Performed by a 3rd Generation assay with a functional sensitivity of <=0.01 uIU/mL. Performed at [facility], [address] [place],  [address]     Blood Alcohol level:  Lab Results  Component Value Date   [facility] <10 58/83/2549    Metabolic Disorder Labs: No results found for: HGBA1C, MPG No results found for: PROLACTIN Lab Results  Component Value Date   CHOL 172 09/06/2017   TRIG 108 09/06/2017   HDL 48 09/06/2017   CHOLHDL 3.6 09/06/2017   VLDL 22 09/06/2017   LDLCALC 102 (H) 09/06/2017    Physical Findings: AIMS:  , ,  ,  ,    CIWA:    COWS:  COWS Total Score: 2  Musculoskeletal: Strength & Muscle Tone: within normal limits Gait & Station: normal Patient leans: N/A  Psychiatric Specialty Exam: Physical Exam  Nursing note and vitals reviewed. Psychiatric: Thought content normal. His affect is blunt. His speech is delayed and slurred. He is actively hallucinating. Cognition and memory are impaired. He expresses impulsivity.    Review of Systems  Neurological: Positive for seizures.  Psychiatric/Behavioral: Positive for hallucinations.  All other systems reviewed and are negative.   Blood pressure 129/82, pulse 93, temperature 97.6 F (36.4 C), temperature source Oral, resp. rate 16, height 5\' 7"  (1.702 m), weight 68.9 kg (152 lb), SpO2 100 %.Body mass index is 23.81 kg/m.  General Appearance: Casual  Eye Contact:  Good  Speech:  Slurred  Volume:  Decreased  Mood:  Euthymic  Affect:  Blunt  Thought Process:  Goal Directed and Descriptions of Associations: Intact  Orientation:  Full (Time, Place, and Person)  Thought Content:  Hallucinations: Auditory  Suicidal Thoughts:  No  Homicidal Thoughts:  No  Memory:  Immediate;   Poor Recent;   Poor Remote;   Poor  Judgement:  Poor  Insight:  Lacking  Psychomotor Activity:  Psychomotor  Retardation  Concentration:  Concentration: Poor and Attention Span: Poor  Recall:  Poor  Fund of Knowledge:  Fair  Language:  Poor  Akathisia:  No  Handed:  Right  AIMS (if indicated):     Assets:  Communication Skills Desire for Improvement Financial Resources/Insurance Housing Physical Health Resilience Social Support  ADL's:  Intact  Cognition:  WNL  Sleep:  Number of Hours: 6     Treatment Plan Summary: Daily contact with patient to assess and evaluate symptoms and progress in treatment and Medication management   Carl [name] is a 47 year old male with a history of schizophrenia admitted for psychotic break in the context of recent medication adjustment. The patient has been on Clozapine for schizophrenia and Depakote for seizures for many years according to his family.  #Psychosis -started on Zyprexa 20 mg nightly -continue Clozapine titration tonight 50 mg, tomorrow 100 mg nightly  #Seizures -Depakote 500 mg TID  #Allergies -Claritin daily  #Metabolic syndrome monitoring -lipid panel, TSH and HgbA1C are pending -EKG, pending  #Admission status -IVC  #Disposition -discharge with family -follow up with Lorna Dibble, MD 09/06/2017, 10:02 AM

## 2017-09-06 NOTE — Progress Notes (Signed)
Patient care taken over at 2300. Patient resting in bed quietly with no issues. Will continue to monitor.

## 2017-09-06 NOTE — BHH Counselor (Signed)
Adult Comprehensive Assessment  Patient ID: Carl Brandt., male   DOB: [birth year], 47 y.o.   MRN: [id number]  Information Source: Information source: Patient  Current Stressors:  Employment / Job issues: Unemployed on Disability  Living/Environment/Situation:  Living Arrangements: Parent How long has patient lived in current situation?: All of life What is atmosphere in current home: Supportive, Quarry manager, Comfortable  Family History:  Marital status: Single Are you sexually active?: No What is your sexual orientation?: Heterosexual Has your sexual activity been affected by drugs, alcohol, medication, or emotional stress?: N/A Does patient have children?: No  Childhood History:  By whom was/is the patient raised?: Both parents Description of patient's relationship with caregiver when they were a child: Good relationship Patient's description of current relationship with people who raised him/her: Relationship is still good.  Does patient have siblings?: Yes Number of Siblings: 2 Description of patient's current relationship with siblings: 2 brothers good relationship Did patient suffer any verbal/emotional/physical/sexual abuse as a child?: No Did patient suffer from severe childhood neglect?: No Has patient ever been sexually abused/assaulted/raped as an adolescent or adult?: No Was the patient ever a victim of a crime or a disaster?: No Witnessed domestic violence?: No Has patient been effected by domestic violence as an adult?: No  Education:  Highest grade of school patient has completed: GED Learning disability?: No  Employment/Work Situation:   Employment situation: On disability How long has patient been on disability: Unable to recall Patient's job has been impacted by current illness: Yes Describe how patient's job has been impacted: on disability unable to work What is the longest time patient has a held a job?: a couple of years Where was the patient employed  at that time?: [employer] Has patient ever been in the [employer]?: No Are There Guns or Other Weapons in [place]?: No  Financial Resources:   Museum/gallery curator resources: Support from parents / caregiver, Marine scientist SSDI Does patient have a Programmer, applications or guardian?: No  Alcohol/Substance Abuse:   What has been your use of drugs/alcohol within the last 12 months?: None reported  Social Support System:   [employer] Support System: Good Describe Community Support System: Family and friends Type of faith/religion: Darrick Meigs How does patient's faith help to cope with current illness?: Helps me deal with stress  Leisure/Recreation:   Leisure and Hobbies: Chess  Strengths/Needs:   What things does the patient do well?: Chess In what areas does patient struggle / problems for patient: Unable to mention any  Discharge Plan:   Does patient have access to transportation?: Yes Will patient be returning to same living situation after discharge?: Yes Currently receiving community mental health services: Yes (From Whom)([facility]) If no, would patient like referral for services when discharged?: No Does patient have financial barriers related to discharge medications?: No  Summary/Recommendations:   Summary and Recommendations (to be completed by the evaluator): Patient is a 47 year old African American male admitted under an IVC after running down the street naked. Patient reports that he was hot. Patient was diagnosed with Schizoaffective disorder, bipolar type without good prognostic features. His affect was blunt and congruent. He reports living home with his parents in [place] and is able to return back home. He denies any substance abuse and his UDS was negative for all substances.  At discharge, patient will return back home to his parents and engage in outpatient treatment with his psychiatrist at [facility]. While here, patient will benefit from  crisis  stabilization, medication evaluation, group therapy and psychoeducation, in addition to case management for discharge planning. At discharge, it is recommended that patient remain compliant with the established discharge plan and continue treatment.   Darin Engels. 09/06/2017

## 2017-09-06 NOTE — BHH Group Notes (Signed)
09/06/2017 1PM  Type of Therapy/Topic:  Group Therapy:  Feelings about Diagnosis  Participation Level:  Did Not Attend   Description of Group:   This group will allow patients to explore their thoughts and feelings about diagnoses they have received. Patients will be guided to explore their level of understanding and acceptance of these diagnoses. Facilitator will encourage patients to process their thoughts and feelings about the reactions of others to their diagnosis and will guide patients in identifying ways to discuss their diagnosis with significant others in their lives. This group will be process-oriented, with patients participating in exploration of their own experiences, giving and receiving support, and processing challenge from other group members.   Therapeutic Goals: 1. Patient will demonstrate understanding of diagnosis as evidenced by identifying two or more symptoms of the disorder 2. Patient will be able to express two feelings regarding the diagnosis 3. Patient will demonstrate their ability to communicate their needs through discussion and/or role play  Summary of Patient Progress: Patient was encouraged and invited to attend group. Patient did not attend group. Social worker will continue to encourage group participation in the future.        Therapeutic Modalities:   Cognitive Behavioral Therapy Brief Therapy Feelings Identification    Darin Engels, LCSW 09/06/2017 1:46 PM

## 2017-09-06 NOTE — BHH Group Notes (Signed)
  09/06/2017  Time: 0900  Type of Therapy and Topic:  Group Therapy:  Setting Goals Participation Level:  Did Not Attend  Description of Group: In this process group, patients discussed using strengths to work toward goals and address challenges.  Patients identified two positive things about themselves and one goal they were working on.  Patients were given the opportunity to share openly and support each other's plan for self-empowerment.  The group discussed the value of gratitude and were encouraged to have a daily reflection of positive characteristics or circumstances.  Patients were encouraged to identify a plan to utilize their strengths to work on current challenges and goals.  Therapeutic Goals 1. Patient will verbalize personal strengths/positive qualities and relate how these can assist with achieving desired personal goals 2. Patients will verbalize affirmation of peers plans for personal change and goal setting 3. Patients will explore the value of gratitude and positive focus as related to successful achievement of goals 4. Patients will verbalize a plan for regular reinforcement of personal positive qualities and circumstances.  Summary of Patient Progress: Pt was invited to attend group but chose not to attend. CSW will continue to encourage pt to attend group throughout their admission.   Therapeutic Modalities Cognitive Behavioral Therapy Motivational Interviewing  Alden Hipp, MSW, LCSW 09/06/2017 9:30 AM

## 2017-09-06 NOTE — Plan of Care (Signed)
Patient is calm and restful appear to be adjusting well in the unit and tolerating his medicines without any noticeable side effects, patient contract for safety of self and others denies any thoughts of suicide ideations and no signs of AVH noted, 15 minute safety rounds in progress no distress at thus time. Progressing Activity: Interest or engagement in activities will improve 09/06/2017 2303 - Progressing by Clemens Catholic, RN Sleeping patterns will improve 09/06/2017 2303 - Progressing by Clemens Catholic, RN Education: Mental status will improve 09/06/2017 2303 - Progressing by Clemens Catholic, RN Safety: Periods of time without injury will increase 09/06/2017 2303 - Progressing by Clemens Catholic, RN Safety: Ability to remain free from injury will improve 09/06/2017 2303 - Progressing by Clemens Catholic, RN

## 2017-09-06 NOTE — Plan of Care (Signed)
  Progressing Education: Mental status will improve 09/06/2017 1718 - Progressing by Rise Mu, RN Note Denies SI/HI/AVH.   Safety: Periods of time without injury will increase 09/06/2017 1718 - Progressing by Rise Mu, RN Note Remains safe on the unit.  Safety: Ability to remain free from injury will improve 09/06/2017 1718 - Progressing by Rise Mu, RN Note Remains safe on the unit.    Not Progressing Activity: Interest or engagement in activities will improve 09/06/2017 1718 - Not Progressing by Rise Mu, RN Note Isolates to room, no group attendance.  Up for meals.  No interaction with peers noted this shif.

## 2017-09-06 NOTE — BHH Suicide Risk Assessment (Signed)
[facility] INPATIENT:  Family/Significant Other Suicide Prevention Education  Suicide Prevention Education:  Patient Refusal for Family/Significant Other Suicide Prevention Education: The patient Carl [name]. has refused to provide written consent for family/significant other to be provided Family/Significant Other Suicide Prevention Education during admission and/or prior to discharge.  Physician notified.  Darin Engels 09/06/2017, 10:32 AM

## 2017-09-07 MED ORDER — POLYETHYLENE GLYCOL 3350 17 G PO PACK
17.0000 g | PACK | Freq: Every day | ORAL | Status: DC
  Administered 2017-09-07 – 2017-09-11 (×5): 17 g via ORAL
  Filled 2017-09-07 (×6): qty 1

## 2017-09-07 MED ORDER — DOCUSATE SODIUM 100 MG PO CAPS
100.0000 mg | ORAL_CAPSULE | Freq: Every day | ORAL | Status: DC
  Administered 2017-09-07 – 2017-09-11 (×5): 100 mg via ORAL
  Filled 2017-09-07 (×5): qty 1

## 2017-09-07 MED ORDER — CLOZAPINE 100 MG PO TABS
100.0000 mg | ORAL_TABLET | Freq: Every day | ORAL | Status: DC
  Administered 2017-09-07 – 2017-09-08 (×2): 100 mg via ORAL
  Filled 2017-09-07 (×2): qty 4

## 2017-09-07 NOTE — BHH Group Notes (Signed)
LCSW Group Therapy Note  09/07/2017 1:00 pm  Type of Therapy/Topic:  Group Therapy:  Emotion Regulation  Participation Level:  Active   Description of Group:    The purpose of this group is to assist patients in learning to regulate negative emotions and experience positive emotions. Patients will be guided to discuss ways in which they have been vulnerable to their negative emotions. These vulnerabilities will be juxtaposed with experiences of positive emotions or situations, and patients will be challenged to use positive emotions to combat negative ones. Special emphasis will be placed on coping with negative emotions in conflict situations, and patients will process healthy conflict resolution skills.  Therapeutic Goals: 1. Patient will identify two positive emotions or experiences to reflect on in order to balance out negative emotions 2. Patient will label two or more emotions that they find the most difficult to experience 3. Patient will demonstrate positive conflict resolution skills through discussion and/or role plays  Summary of Patient Progress:  Carl [name] was able to actively participate in today's group.  He identified several negative and positive emotions and the ones that he is most vulnerable to.  Emotions Carl [name] identified included "righteous", "joyful", "shy" and "intolerable".  Carl [name] shared that he finds all his negative emotions to be difficult which has often led to negative or "bad" experiences for him.  Carl [name] shared that positive activities such as playing basketball and shooting pool helps him to cope with negative or difficult emotions.     Therapeutic Modalities:   Cognitive Behavioral Therapy Feelings Identification Dialectical Behavioral Therapy

## 2017-09-07 NOTE — Progress Notes (Signed)
[facility] MD Progress Note  09/07/2017 11:53 AM Carl Matte.  MRN:  [id number]  Subjective:   Carl [name] met with treatment team this morning. He report hallucinations improving. No side effects of medications. He is secluded to his room and bed. Does not interact with peers or staff, does not participate in programming.  Treatment plan. We restarted Depakote and Clozapine titration.  Social/disposition. Discharge to home with family. Follow up with [facility].   Principal Problem: Schizoaffective disorder, bipolar type without good prognostic features ([place]) Diagnosis:   Patient Active Problem List   Diagnosis Date Noted  . Schizoaffective disorder, bipolar type without good prognostic features ([place]) [F25.0] 09/05/2017    Priority: High  . Seizures ([name]) [R56.9] 09/05/2017  . Infected postoperative seroma [IMO0002] 02/27/2015   Total Time spent with patient: 30 minutes  Past Psychiatric History: schizophrenia  Past Medical History:  Past Medical History:  Diagnosis Date  . Abdominal pain   . Constipation   . Decreased visual acuity   . Nevus   . Polyuria   . Recurrent boils    scalp behind ear  . Schizophrenia ([place])   . Soft tissue mass    left scalp posterior    Past Surgical History:  Procedure Laterality Date  . MASS EXCISION Left 02/11/2015   Procedure: EXCISION MASS, left post neck;  Surgeon: Florene Glen, MD;  Location: [place] [facility];  Service: General;  Laterality: Left;   Family History:  Family History  Problem Relation Age of Onset  . Hyperlipidemia Mother   . Hypertension Father   . Diabetes Father    Family Psychiatric  History: none reported Social History:  Social History   Substance and Sexual Activity  Alcohol Use No  . Alcohol/week: 0.0 oz     Social History   Substance and Sexual Activity  Drug Use No    Social History   Socioeconomic History  . Marital status: Single    Spouse name: None  . Number of children: None  . Years  of education: None  . Highest education level: None  Social Needs  . Financial resource strain: None  . Food insecurity - worry: None  . Food insecurity - inability: None  . Transportation needs - medical: None  . Transportation needs - non-medical: None  Occupational History  . None  Tobacco Use  . Smoking status: Never Smoker  . Smokeless tobacco: Never Used  Substance and Sexual Activity  . Alcohol use: No    Alcohol/week: 0.0 oz  . Drug use: No  . Sexual activity: None  Other Topics Concern  . None  Social History Narrative  . None   Additional Social History:                         Sleep: Fair  Appetite:  Fair  Current Medications: Current Facility-Administered Medications  Medication Dose Route Frequency Provider Last Rate Last Dose  . acetaminophen (TYLENOL) tablet 650 mg  650 mg Oral Q6H PRN Chauncey Mann, MD      . alum & mag hydroxide-simeth (MAALOX/MYLANTA) 200-200-20 MG/5ML suspension 30 mL  30 mL Oral Q4H PRN Chauncey Mann, MD      . cloZAPine (CLOZARIL) tablet 100 mg  100 mg Oral QHS Debar Plate B, MD      . divalproex (DEPAKOTE) DR tablet 500 mg  500 mg Oral Q8H Marilena Trevathan B, MD   500 mg at 09/07/17 0615  .  loratadine (CLARITIN) tablet 10 mg  10 mg Oral Daily Fiora Weill B, MD   10 mg at 09/07/17 0811  . magnesium hydroxide (MILK OF MAGNESIA) suspension 30 mL  30 mL Oral Daily PRN Chauncey Mann, MD      . OLANZapine [facility]) tablet 10 mg  10 mg Oral BID Chauncey Mann, MD   10 mg at 09/07/17 6962    Lab Results:  Results for orders placed or performed during the hospital encounter of 09/05/17 (from the past 48 hour(s))  Valproic acid level     Status: Abnormal   Collection Time: 09/05/17  8:26 PM  Result Value Ref Range   Valproic Acid Lvl 39 (L) 50.0 - 100.0 ug/mL    Comment: Performed at [facility], [address] [place], [place] [address]  CBC with Differential/Platelet     Status: Abnormal    Collection Time: 09/05/17  8:26 PM  Result Value Ref Range   WBC 7.7 3.8 - 10.6 K/uL   RBC 4.99 4.40 - 5.90 MIL/uL   Hemoglobin 13.5 13.0 - 18.0 g/dL   HCT 40.8 40.0 - 52.0 %   MCV 81.6 80.0 - 100.0 fL   MCH 27.1 26.0 - 34.0 pg   MCHC 33.2 32.0 - 36.0 g/dL   RDW 14.8 (H) 11.5 - 14.5 %   Platelets 251 150 - 440 K/uL   Neutrophils Relative % 48 %   Neutro Abs 3.7 1.4 - 6.5 K/uL   Lymphocytes Relative 38 %   Lymphs Abs 2.9 1.0 - 3.6 K/uL   Monocytes Relative 12 %   Monocytes Absolute 1.0 0.2 - 1.0 K/uL   Eosinophils Relative 1 %   Eosinophils Absolute 0.1 0 - 0.7 K/uL   Basophils Relative 1 %   Basophils Absolute 0.0 0 - 0.1 K/uL    Comment: Performed at [facility], [address] [place], [place] [address]  Hemoglobin A1c     Status: None   Collection Time: 09/06/17  7:02 AM  Result Value Ref Range   Hgb A1c MFr Bld 5.2 4.8 - 5.6 %    Comment: (NOTE) Pre diabetes:          5.7%-6.4% Diabetes:              >6.4% Glycemic control for   <7.0% adults with diabetes    Mean Plasma Glucose 102.54 mg/dL    Comment: Performed at [facility], [place] [address] [place], [place] [address]  Lipid panel     Status: Abnormal   Collection Time: 09/06/17  7:02 AM  Result Value Ref Range   Cholesterol 172 0 - 200 mg/dL   Triglycerides 108 <150 mg/dL   HDL 48 >40 mg/dL   Total CHOL/HDL Ratio 3.6 RATIO   VLDL 22 0 - 40 mg/dL   LDL Cholesterol 102 (H) 0 - 99 mg/dL    Comment:        Total Cholesterol/HDL:CHD Risk Coronary Heart Disease Risk Table                     Men   Women  1/2 Average Risk   3.4   3.3  Average Risk       5.0   4.4  2 X Average Risk   9.6   7.1  3 X Average Risk  23.4   11.0        Use the calculated Patient Ratio above and the CHD Risk  Table to determine the patient's CHD Risk.        ATP III CLASSIFICATION (LDL):  <100     mg/dL   Optimal  100-129  mg/dL   Near or Above                    Optimal  130-159  mg/dL   Borderline  160-189   mg/dL   High  >190     mg/dL   Very High Performed at [facility], [address] [place], [place] [address]   TSH     Status: None   Collection Time: 09/06/17  7:02 AM  Result Value Ref Range   TSH 1.247 0.350 - 4.500 uIU/mL    Comment: Performed by a 3rd Generation assay with a functional sensitivity of <=0.01 uIU/mL. Performed at [facility], [address] [place], [place] [address]     Blood Alcohol level:  Lab Results  Component Value Date   [facility] <10 09/32/6712    Metabolic Disorder Labs: Lab Results  Component Value Date   HGBA1C 5.2 09/06/2017   MPG 102.54 09/06/2017   No results found for: PROLACTIN Lab Results  Component Value Date   CHOL 172 09/06/2017   TRIG 108 09/06/2017   HDL 48 09/06/2017   CHOLHDL 3.6 09/06/2017   VLDL 22 09/06/2017   LDLCALC 102 (H) 09/06/2017    Physical Findings: AIMS:  , ,  ,  ,    CIWA:    COWS:  COWS Total Score: 2  Musculoskeletal: Strength & Muscle Tone: within normal limits Gait & Station: normal Patient leans: N/A  Psychiatric Specialty Exam: Physical Exam  Nursing note and vitals reviewed. Psychiatric: Thought content normal. His affect is blunt. His speech is slurred. He is withdrawn and actively hallucinating. Cognition and memory are impaired. He expresses impulsivity.    Review of Systems  Neurological: Negative.   Psychiatric/Behavioral: Positive for hallucinations.  All other systems reviewed and are negative.   Blood pressure 130/89, pulse 87, temperature 98.2 F (36.8 C), temperature source Oral, resp. rate 16, height 5' 7"  (1.702 m), weight 68.9 kg (152 lb), SpO2 100 %.Body mass index is 23.81 kg/m.  General Appearance: Fairly Groomed  Eye Contact:  Fair  Speech:  Slurred  Volume:  Decreased  Mood:  Anxious  Affect:  Blunt  Thought Process:  Goal Directed and Descriptions of Associations: Intact  Orientation:  Full (Time, Place, and Person)  Thought Content:  Hallucinations:  Auditory  Suicidal Thoughts:  No  Homicidal Thoughts:  No  Memory:  Immediate;   Fair Recent;   Fair Remote;   Fair  Judgement:  Poor  Insight:  Shallow  Psychomotor Activity:  Psychomotor Retardation  Concentration:  Concentration: Fair and Attention Span: Fair  Recall:  [employer] of Knowledge:  Fair  Language:  Fair  Akathisia:  No  Handed:  Right  AIMS (if indicated):     Assets:  Communication Skills Desire for Improvement Financial Resources/Insurance Housing Physical Health Resilience Social Support  ADL's:  Intact  Cognition:  WNL  Sleep:  Number of Hours: 6.45     Treatment Plan Summary: Daily contact with patient to assess and evaluate symptoms and progress in treatment and Medication management   Carl [name] is a 47 year old male with a history of schizophrenia admitted for psychotic break in the context of recent medication adjustment. The patient has been on Clozapine for schizophrenia and Depakote for seizures for many years  according to his family.  #Psychosis -started on Zyprexa 20 mg nightly -continue Clozapine titration tonight 100 mg  #Seizures -Depakote 500 mg TID  #Allergies -Claritin daily  #Metabolic syndrome monitoring -lipid panel, TSH and HgbA1C are pending -EKG, pending  #Admission status -IVC  #Disposition -discharge with family -follow up with Lorna Dibble, MD 09/07/2017, 11:53 AM

## 2017-09-07 NOTE — Progress Notes (Signed)
D:Patient  Able to go to 1 o'clock  Group this afternoon . Patient remains  Close to his room  During the remainder of time today . Limited time  Spent  With his peer . Noted in the dayroom but not  Interacting , Appropriate  ADL'S this shift . Noted to continue to have auditory hallucinations . Very paranoid with medications . Writer opned medication  In front of patient   A: Encourage patient participation with unit programming . Instruction  Given on  Medication , verbalize understanding. R: Voice no other concerns. Staff continue to monitor

## 2017-09-07 NOTE — Plan of Care (Signed)
Seen patient in the milieu area socializing with peers with out issues, patient is safe and contract for safety. Progressing Activity: Interest or engagement in activities will improve 09/07/2017 2020 - Progressing by Clemens Catholic, RN Sleeping patterns will improve 09/07/2017 2020 - Progressing by Clemens Catholic, RN Education: Mental status will improve 09/07/2017 2020 - Progressing by Clemens Catholic, RN Safety: Periods of time without injury will increase 09/07/2017 2020 - Progressing by Clemens Catholic, RN Safety: Ability to remain free from injury will improve 09/07/2017 2020 - Progressing by Clemens Catholic, RN

## 2017-09-07 NOTE — Plan of Care (Signed)
Attending  activities on unit . Sleeping  between  programming  Continue to to have  auditory hallucination  and   paranoid affect . No safety concerns  from patient    Progressing Activity: Interest or engagement in activities will improve 09/07/2017 1821 - Progressing by Leodis Liverpool, RN Sleeping patterns will improve 09/07/2017 1821 - Progressing by Leodis Liverpool, RN Education: Mental status will improve 09/07/2017 1821 - Progressing by Leodis Liverpool, RN Safety: Periods of time without injury will increase 09/07/2017 1821 - Progressing by Leodis Liverpool, RN Safety: Ability to remain free from injury will improve 09/07/2017 1821 - Progressing by Leodis Liverpool, RN

## 2017-09-07 NOTE — Tx Team (Signed)
Interdisciplinary Treatment and Diagnostic Plan Update  09/07/2017 Time of Session: 10:30am Carl [name]. MRN: [id number]  Principal Diagnosis: Schizoaffective disorder, bipolar type without good prognostic features ([place])  Secondary Diagnoses: Principal Problem:   Schizoaffective disorder, bipolar type without good prognostic features ([place]) Active Problems:   Seizures ([place])   Current Medications:  Current Facility-Administered Medications  Medication Dose Route Frequency Provider Last Rate Last Dose  . acetaminophen (TYLENOL) tablet 650 mg  650 mg Oral Q6H PRN Chauncey Mann, MD      . alum & mag hydroxide-simeth (MAALOX/MYLANTA) 200-200-20 MG/5ML suspension 30 mL  30 mL Oral Q4H PRN Chauncey Mann, MD      . cloZAPine (CLOZARIL) tablet 100 mg  100 mg Oral QHS Pucilowska, Jolanta B, MD      . divalproex (DEPAKOTE) DR tablet 500 mg  500 mg Oral Q8H Pucilowska, Jolanta B, MD   500 mg at 09/07/17 0615  . loratadine (CLARITIN) tablet 10 mg  10 mg Oral Daily Pucilowska, Jolanta B, MD   10 mg at 09/07/17 0811  . magnesium hydroxide (MILK OF MAGNESIA) suspension 30 mL  30 mL Oral Daily PRN Chauncey Mann, MD      . OLANZapine [facility]) tablet 10 mg  10 mg Oral BID Chauncey Mann, MD   10 mg at 09/07/17 5093   PTA Medications: No medications prior to admission.    Patient Stressors: Medication change or noncompliance Other: medication recently changed; bad reaction Clozaril discontinued and resperidone and depakote started two weeks ago resulted in bad reaction  Patient Strengths: Ability for insight Active sense of humor Motivation for treatment/growth Supportive family/friends  Treatment Modalities: Medication Management, Group therapy, Case management,  1 to 1 session with clinician, Psychoeducation, Recreational therapy.   Physician Treatment Plan for Primary Diagnosis: Schizoaffective disorder, bipolar type without good prognostic features ([place]) Long Term Goal(s): Improvement  in symptoms so as ready for discharge NA   Short Term Goals: Ability to identify changes in lifestyle to reduce recurrence of condition will improve Ability to verbalize feelings will improve Ability to disclose and discuss suicidal ideas Ability to demonstrate self-control will improve Ability to identify and develop effective coping behaviors will improve Ability to maintain clinical measurements within normal limits will improve Compliance with prescribed medications will improve Ability to identify triggers associated with substance abuse/mental health issues will improve NA  Medication Management: Evaluate patient's response, side effects, and tolerance of medication regimen.  Therapeutic Interventions: 1 to 1 sessions, Unit Group sessions and Medication administration.  Evaluation of Outcomes: Progressing  Physician Treatment Plan for Secondary Diagnosis: Principal Problem:   Schizoaffective disorder, bipolar type without good prognostic features ([place]) Active Problems:   Seizures ([place])  Long Term Goal(s): Improvement in symptoms so as ready for discharge NA   Short Term Goals: Ability to identify changes in lifestyle to reduce recurrence of condition will improve Ability to verbalize feelings will improve Ability to disclose and discuss suicidal ideas Ability to demonstrate self-control will improve Ability to identify and develop effective coping behaviors will improve Ability to maintain clinical measurements within normal limits will improve Compliance with prescribed medications will improve Ability to identify triggers associated with substance abuse/mental health issues will improve NA     Medication Management: Evaluate patient's response, side effects, and tolerance of medication regimen.  Therapeutic Interventions: 1 to 1 sessions, Unit Group sessions and Medication administration.  Evaluation of Outcomes: Progressing   RN Treatment Plan for Primary  Diagnosis: Schizoaffective disorder, bipolar  type without good prognostic features ([place]) Long Term Goal(s): Knowledge of disease and therapeutic regimen to maintain health will improve  Short Term Goals: Ability to verbalize feelings will improve, Ability to identify and develop effective coping behaviors will improve and Compliance with prescribed medications will improve  Medication Management: RN will administer medications as ordered by provider, will assess and evaluate patient's response and provide education to patient for prescribed medication. RN will report any adverse and/or side effects to prescribing provider.  Therapeutic Interventions: 1 on 1 counseling sessions, Psychoeducation, Medication administration, Evaluate responses to treatment, Monitor vital signs and CBGs as ordered, Perform/monitor CIWA, COWS, AIMS and Fall Risk screenings as ordered, Perform wound care treatments as ordered.  Evaluation of Outcomes: Progressing   LCSW Treatment Plan for Primary Diagnosis: Schizoaffective disorder, bipolar type without good prognostic features ([place]) Long Term Goal(s): Safe transition to appropriate next level of care at discharge, Engage patient in therapeutic group addressing interpersonal concerns.  Short Term Goals: Engage patient in aftercare planning with referrals and resources, Facilitate acceptance of mental health diagnosis and concerns, Identify triggers associated with mental health/substance abuse issues and Increase skills for wellness and recovery  Therapeutic Interventions: Assess for all discharge needs, 1 to 1 time with Social worker, Explore available resources and support systems, Assess for adequacy in community support network, Educate family and significant other(s) on suicide prevention, Complete Psychosocial Assessment, Interpersonal group therapy.  Evaluation of Outcomes: Progressing   Progress in Treatment: Attending groups: No. Participating in groups:  No. Taking medication as prescribed: Yes. Toleration medication: Yes. Family/Significant other contact made: No, will contact:  Patient refused family contact Patient understands diagnosis: Yes. Discussing patient identified problems/goals with staff: Yes. Medical problems stabilized or resolved: Yes. Denies suicidal/homicidal ideation: Yes. Issues/concerns per patient self-inventory: No. Other:   New problem(s) identified: None  New Short Term/Long Term Goal(s): "To get better and go home."  Discharge Plan or Barriers: To return home and continue to follow up with outside provider.  Reason for Continuation of Hospitalization: Medication stabilization  Estimated Length of Stay: 3-5 days  Attendees: Patient: Carl [name]. 09/07/2017 11:14 AM  Physician: Dr. Bary Leriche, MD 09/07/2017 11:14 AM  Nursing: Varney Biles, RN 09/07/2017 11:14 AM  RN Care Manager: 09/07/2017 11:14 AM  Social Worker: Darin Engels, [place] 09/07/2017 11:14 AM  Recreational Therapist:  09/07/2017 11:14 AM  Other: Alden Hipp, LCSW 09/07/2017 11:14 AM  Other:  09/07/2017 11:14 AM  Other: 09/07/2017 11:14 AM    Scribe for Treatment Team: Darin Engels, LCSW 09/07/2017 11:14 AM

## 2017-09-08 NOTE — Progress Notes (Addendum)
[facility] MD Progress Note  09/08/2017 5:24 PM Carl [name].  MRN:  [id number]  Subjective:   Carl [name] is very paranoid and takes medications only with outmost encouragement. Packages have to be opened in his presence. When asked directly, he denies any symptoms of depression, anxiety or psychosis and denies hallucinations. He spends most of the day in bed with his head covered but in the afternoon, he was walking around the nursing station well groomed and even was able to have a small conversation.  Treatment plan. He is now on Depakote 500 mg TID and Clozapine titration. He will receive 100 mg tonight. We will increase to 150 mg tomorrow.  Social/disposition. Discharge to home with family. Follow up with Dr. Loni Muse at [facility].  Principal Problem: Schizoaffective disorder, bipolar type without good prognostic features ([place]) Diagnosis:   Patient Active Problem List   Diagnosis Date Noted  . Schizoaffective disorder, bipolar type without good prognostic features ([place]) [F25.0] 09/05/2017    Priority: High  . Seizures ([place]) [R56.9] 09/05/2017  . Infected postoperative seroma [IMO0002] 02/27/2015   Total Time spent with patient: 30 minutes  Past Psychiatric History: schizophrenia  Past Medical History:  Past Medical History:  Diagnosis Date  . Abdominal pain   . Constipation   . Decreased visual acuity   . Nevus   . Polyuria   . Recurrent boils    scalp behind ear  . Schizophrenia ([place])   . Soft tissue mass    left scalp posterior    Past Surgical History:  Procedure Laterality Date  . MASS EXCISION Left 02/11/2015   Procedure: EXCISION MASS, left post neck;  Surgeon: Florene Glen, MD;  Location: [place];  Service: General;  Laterality: Left;   Family History:  Family History  Problem Relation Age of Onset  . Hyperlipidemia Mother   . Hypertension Father   . Diabetes Father    Family Psychiatric  History: bipolar Social History:  Social History   Substance  and Sexual Activity  Alcohol Use No  . Alcohol/week: 0.0 oz     Social History   Substance and Sexual Activity  Drug Use No    Social History   Socioeconomic History  . Marital status: Single    Spouse name: None  . Number of children: None  . Years of education: None  . Highest education level: None  Social Needs  . Financial resource strain: None  . Food insecurity - worry: None  . Food insecurity - inability: None  . Transportation needs - medical: None  . Transportation needs - non-medical: None  Occupational History  . None  Tobacco Use  . Smoking status: Never Smoker  . Smokeless tobacco: Never Used  Substance and Sexual Activity  . Alcohol use: No    Alcohol/week: 0.0 oz  . Drug use: No  . Sexual activity: None  Other Topics Concern  . None  Social History Narrative  . None   Additional Social History:                         Sleep: Fair  Appetite:  Fair  Current Medications: Current Facility-Administered Medications  Medication Dose Route Frequency Provider Last Rate Last Dose  . acetaminophen (TYLENOL) tablet 650 mg  650 mg Oral Q6H PRN Chauncey Mann, MD      . alum & mag hydroxide-simeth (MAALOX/MYLANTA) 200-200-20 MG/5ML suspension 30 mL  30 mL Oral Q4H PRN Chauncey Mann,  MD      . cloZAPine (CLOZARIL) tablet 100 mg  100 mg Oral QHS Sadye Kiernan B, MD   100 mg at 09/07/17 2142  . divalproex (DEPAKOTE) DR tablet 500 mg  500 mg Oral Q8H Shamara Soza B, MD   500 mg at 09/08/17 1439  . docusate sodium (COLACE) capsule 100 mg  100 mg Oral Daily Rodert Hinch B, MD   100 mg at 09/08/17 0813  . loratadine (CLARITIN) tablet 10 mg  10 mg Oral Daily Nemesis [place] B, MD   10 mg at 09/08/17 0813  . magnesium hydroxide (MILK OF MAGNESIA) suspension 30 mL  30 mL Oral Daily PRN Chauncey Mann, MD      . OLANZapine [facility]) tablet 10 mg  10 mg Oral BID Chauncey Mann, MD   10 mg at 09/08/17 1656  . polyethylene glycol (MIRALAX  / GLYCOLAX) packet 17 g  17 g Oral Daily Mariel Lukins B, MD   17 g at 09/08/17 0813    Lab Results: No results found for this or any previous visit (from the past 32 hour(s)).  Blood Alcohol level:  Lab Results  Component Value Date   ETH <10 36/64/4034    Metabolic Disorder Labs: Lab Results  Component Value Date   HGBA1C 5.2 09/06/2017   MPG 102.54 09/06/2017   No results found for: PROLACTIN Lab Results  Component Value Date   CHOL 172 09/06/2017   TRIG 108 09/06/2017   HDL 48 09/06/2017   CHOLHDL 3.6 09/06/2017   VLDL 22 09/06/2017   LDLCALC 102 (H) 09/06/2017    Physical Findings: AIMS:  , ,  ,  ,    CIWA:    COWS:  COWS Total Score: 2  Musculoskeletal: Strength & Muscle Tone: within normal limits Gait & Station: normal Patient leans: N/A  Psychiatric Specialty Exam: Physical Exam  Nursing note and vitals reviewed. Psychiatric: His speech is normal. His affect is blunt. He is slowed, withdrawn and actively hallucinating. Thought content is paranoid. Cognition and memory are impaired. He expresses impulsivity.    Review of Systems  Neurological: Negative.   Psychiatric/Behavioral: Positive for hallucinations.  All other systems reviewed and are negative.   Blood pressure 104/63, pulse 92, temperature 98.2 F (36.8 C), temperature source Oral, resp. rate 16, height 5\' 7"  (1.702 m), weight 68.9 kg (152 lb), SpO2 100 %.Body mass index is 23.81 kg/m.  General Appearance: Casual  Eye Contact:  Minimal  Speech:  Clear and Coherent  Volume:  Decreased  Mood:  Euthymic  Affect:  Flat  Thought Process:  Goal Directed and Descriptions of Associations: Intact  Orientation:  Full (Time, Place, and Person)  Thought Content:  Hallucinations: Auditory and Paranoid Ideation  Suicidal Thoughts:  No  Homicidal Thoughts:  No  Memory:  Immediate;   Fair Recent;   Fair Remote;   Fair  Judgement:  Poor  Insight:  Shallow  Psychomotor Activity:  Psychomotor  Retardation  Concentration:  Concentration: Fair and Attention Span: Fair  Recall:  [employer] of Knowledge:  Fair  Language:  Fair  Akathisia:  No  Handed:  Right  AIMS (if indicated):     Assets:  Communication Skills Desire for Improvement Financial Resources/Insurance Housing Physical Health Resilience Social Support  ADL's:  Intact  Cognition:  WNL  Sleep:  Number of Hours: 7     Treatment Plan Summary: Daily contact with patient to assess and evaluate symptoms and progress in treatment and  Medication management   Mr. Sine is a 47 year old male with a history of schizophrenia admitted for psychotic break in the context of recent medication adjustment. The patient has been on Clozapine for schizophrenia and Depakote for seizures for many years according to his family.  #Psychosis -continue Zyprexa 20 mg nightly for now -continueClozapinetitration tonight100 mg  #Seizures -Depakote 500 mg TID, level in am  #Allergies -Claritin daily  #Metabolic syndrome monitoring -lipid panel, TSH and HgbA1C are pending -EKG, pending  #Admission status -IVC -letter to extend commitment for another 30 days  #Disposition -discharge with family -follow up with Lorna Dibble, MD 09/08/2017, 5:24 PM

## 2017-09-08 NOTE — Plan of Care (Signed)
  Patient remains  paranoid . Speech low almost unable to understand  Isolates to room . Limited interactions  with peers and staff . No safety concerns    Progressing Activity: Interest or engagement in activities will improve 09/08/2017 1805 - Progressing by Leodis Liverpool, RN Sleeping patterns will improve 09/08/2017 1805 - Progressing by Leodis Liverpool, RN Education: Mental status will improve 09/08/2017 1805 - Progressing by Leodis Liverpool, RN Safety: Periods of time without injury will increase 09/08/2017 1805 - Progressing by Leodis Liverpool, RN Safety: Ability to remain free from injury will improve 09/08/2017 1805 - Progressing by Leodis Liverpool, RN

## 2017-09-08 NOTE — Progress Notes (Addendum)
D:Patient remains  paranoid . Speech low almost unable to understand  Isolates to room . Limited interactions  with peers and staff . No safety concerns  Appetite good .  Denies Hallucinations , writer noted  Reacting to internal stimuli. Appropriate  ADL and putting on clean clothes  A: Encourage patient participation with unit programming . Instruction  Given on  Medication , verbalize understanding. R: Voice no other concerns. Staff continue to monitor

## 2017-09-08 NOTE — Progress Notes (Signed)
D: Pt denies SI/HI/AVH. Pt is pleasant and cooperative. Pt has minimal interaction, pt paranoid and isolates to himself most of the evening.   A: Pt was offered support and encouragement. Pt was given scheduled medications. Pt was encourage to attend groups. Q 15 minute checks were done for safety.   R:Pt attends groups and interacts well with peers and staff. Pt is taking medication. Pt has no complaints.Pt receptive to treatment and safety maintained on unit.

## 2017-09-08 NOTE — [facility] Group Notes (Signed)
09/08/2017  Time: 1:00PM  Type of Therapy/Topic:  Group Therapy:  Balance in Life  Participation Level:  Did Not Attend  Description of Group:   This group will address the concept of balance and how it feels and looks when one is unbalanced. Patients will be encouraged to process areas in their lives that are out of balance and identify reasons for remaining unbalanced. Facilitators will guide patients in utilizing problem-solving interventions to address and correct the stressor making their life unbalanced. Understanding and applying boundaries will be explored and addressed for obtaining and maintaining a balanced life. Patients will be encouraged to explore ways to assertively make their unbalanced needs known to significant others in their lives, using other group members and facilitator for support and feedback.  Therapeutic Goals: 1. Patient will identify two or more emotions or situations they have that consume much of in their lives. 2. Patient will identify signs/triggers that life has become out of balance:  3. Patient will identify two ways to set boundaries in order to achieve balance in their lives:  4. Patient will demonstrate ability to communicate their needs through discussion and/or role plays  Summary of Patient Progress: Pt was invited to attend group but chose not to attend. CSW will continue to encourage pt to attend group throughout their admission.    Therapeutic Modalities:   Cognitive Behavioral Therapy Solution-Focused Therapy Assertiveness Training  Alden Hipp, MSW, LCSW 09/08/2017 2:06 PM

## 2017-09-08 NOTE — [facility] Group Notes (Signed)
  09/08/2017 9am  Type of Therapy and Topic: Group Therapy: Goals Group: SMART Goals   Participation Level: Did Not Attend  Description of Group:    The purpose of a daily goals group is to assist and guide patients in setting recovery/wellness-related goals. The objective is to set goals as they relate to the crisis in which they were admitted. Patients will be using SMART goal modalities to set measurable goals. Characteristics of realistic goals will be discussed and patients will be assisted in setting and processing how one will reach their goal. Facilitator will also assist patients in applying interventions and coping skills learned in psycho-education groups to the SMART goal and process how one will achieve defined goal.   Therapeutic Goals:   -Patients will develop and document one goal related to or their crisis in which brought them into treatment.  -Patients will be guided by LCSW using SMART goal setting modality in how to set a measurable, attainable, realistic and time sensitive goal.  -Patients will process barriers in reaching goal.  -Patients will process interventions in how to overcome and successful in reaching goal.   Patient's Goal: Patient was encouraged and invited to attend group. Patient did not attend group. Social worker will continue to encourage group participation in the future.    Therapeutic Modalities:  Motivational Interviewing  Cognitive Behavioral Therapy  Crisis Intervention Model  SMART goals setting  Darin Engels, [place] 09/08/2017 9:40 AM

## 2017-09-08 NOTE — Plan of Care (Signed)
  Safety: Periods of time without injury will increase 09/08/2017 2320 - Progressing by [place] Crosby, RN Note Pt safe on the unit at this time

## 2017-09-09 LAB — AMMONIA: Ammonia: 24 umol/L (ref 9–35)

## 2017-09-09 LAB — VALPROIC ACID LEVEL: VALPROIC ACID LVL: 99 ug/mL (ref 50.0–100.0)

## 2017-09-09 MED ORDER — OLANZAPINE 10 MG PO TABS
10.0000 mg | ORAL_TABLET | Freq: Every day | ORAL | Status: DC
  Administered 2017-09-10 – 2017-09-11 (×2): 10 mg via ORAL
  Filled 2017-09-09 (×2): qty 1

## 2017-09-09 MED ORDER — CLOZAPINE 100 MG PO TABS: 200.0000 mg | ORAL_TABLET | Freq: Every day | ORAL | Status: DC

## 2017-09-09 MED ORDER — CLOZAPINE 25 MG PO TABS
150.0000 mg | ORAL_TABLET | Freq: Every day | ORAL | Status: AC
  Administered 2017-09-09 – 2017-09-10 (×2): 150 mg via ORAL
  Filled 2017-09-09 (×2): qty 1

## 2017-09-09 MED ORDER — CLOZAPINE 100 MG PO TABS
200.0000 mg | ORAL_TABLET | Freq: Every day | ORAL | Status: DC
  Administered 2017-09-11: 200 mg via ORAL
  Filled 2017-09-09: qty 2

## 2017-09-09 NOTE — Progress Notes (Addendum)
[facility] MD Progress Note  09/09/2017 1:21 PM Carl [name].  MRN:  [id number]  Subjective:   Carl [name] was restarted on Clozapine and is able to tolerate it well but in the morning it is hard to wake him up. He does well in the afternoon. Last night he was nicely groomed and played chess with another patient. Sleep and appetite are good. The patient is still very pasranoid especially about his medications.  Met with his brother who frequently provides company and is very supportive.  Treatment plan. We continue Depakote 500 mg TID for seizures and mood stabilization and Clozapine titration. He will get 150 mg today and tomorrow, 200 mg after. We continue Zyprexa taper.  Social/disposition. Discharge to home with family. Follow up with Dr. Loni Muse at [facility].   Principal Problem: Schizoaffective disorder, bipolar type without good prognostic features ([place]) Diagnosis:   Patient Active Problem List   Diagnosis Date Noted  . Schizoaffective disorder, bipolar type without good prognostic features ([place]) [F25.0] 09/05/2017    Priority: High  . Seizures ([place]) [R56.9] 09/05/2017  . Infected postoperative seroma [IMO0002] 02/27/2015   Total Time spent with patient: 30 minutes  Past Psychiatric History: schizophrenia  Past Medical History:  Past Medical History:  Diagnosis Date  . Abdominal pain   . Constipation   . Decreased visual acuity   . Nevus   . Polyuria   . Recurrent boils    scalp behind ear  . Schizophrenia ([place])   . Soft tissue mass    left scalp posterior    Past Surgical History:  Procedure Laterality Date  . MASS EXCISION Left 02/11/2015   Procedure: EXCISION MASS, left post neck;  Surgeon: Florene Glen, MD;  Location: [place];  Service: General;  Laterality: Left;   Family History:  Family History  Problem Relation Age of Onset  . Hyperlipidemia Mother   . Hypertension Father   . Diabetes Father    Family Psychiatric  History: none reported Social  History:  Social History   Substance and Sexual Activity  Alcohol Use No  . Alcohol/week: 0.0 oz     Social History   Substance and Sexual Activity  Drug Use No    Social History   Socioeconomic History  . Marital status: Single    Spouse name: None  . Number of children: None  . Years of education: None  . Highest education level: None  Social Needs  . Financial resource strain: None  . Food insecurity - worry: None  . Food insecurity - inability: None  . Transportation needs - medical: None  . Transportation needs - non-medical: None  Occupational History  . None  Tobacco Use  . Smoking status: Never Smoker  . Smokeless tobacco: Never Used  Substance and Sexual Activity  . Alcohol use: No    Alcohol/week: 0.0 oz  . Drug use: No  . Sexual activity: None  Other Topics Concern  . None  Social History Narrative  . None   Additional Social History:                         Sleep: Fair  Appetite:  Fair  Current Medications: Current Facility-Administered Medications  Medication Dose Route Frequency Provider Last Rate Last Dose  . acetaminophen (TYLENOL) tablet 650 mg  650 mg Oral Q6H PRN Chauncey Mann, MD      . alum & mag hydroxide-simeth (MAALOX/MYLANTA) 200-200-20 MG/5ML suspension 30 mL  30 mL Oral Q4H PRN Chauncey Mann, MD      . cloZAPine (CLOZARIL) tablet 200 mg  200 mg Oral QHS Kanan Sobek B, MD      . divalproex (DEPAKOTE) DR tablet 500 mg  500 mg Oral Q8H Codi Kertz B, MD   500 mg at 09/09/17 0629  . docusate sodium (COLACE) capsule 100 mg  100 mg Oral Daily Jesusa Stenerson B, MD   100 mg at 09/09/17 0850  . loratadine (CLARITIN) tablet 10 mg  10 mg Oral Daily Wolf Boulay B, MD   10 mg at 09/09/17 0850  . magnesium hydroxide (MILK OF MAGNESIA) suspension 30 mL  30 mL Oral Daily PRN Chauncey Mann, MD      . OLANZapine [facility]) tablet 10 mg  10 mg Oral BID Chauncey Mann, MD   10 mg at 09/09/17 0850  .  polyethylene glycol (MIRALAX / GLYCOLAX) packet 17 g  17 g Oral Daily Vaniah Chambers B, MD   17 g at 09/09/17 6203    Lab Results:  Results for orders placed or performed during the hospital encounter of 09/05/17 (from the past 48 hour(s))  Valproic acid level     Status: None   Collection Time: 09/09/17  6:55 AM  Result Value Ref Range   Valproic Acid Lvl 99 50.0 - 100.0 ug/mL    Comment: Performed at [facility], [address] [place], [place] [address]  Ammonia     Status: None   Collection Time: 09/09/17  6:55 AM  Result Value Ref Range   Ammonia 24 9 - 35 umol/L    Comment: Performed at [facility], [address] [place], [place] [address]    Blood Alcohol level:  Lab Results  Component Value Date   [facility] <10 53/64/6803    Metabolic Disorder Labs: Lab Results  Component Value Date   HGBA1C 5.2 09/06/2017   MPG 102.54 09/06/2017   No results found for: PROLACTIN Lab Results  Component Value Date   CHOL 172 09/06/2017   TRIG 108 09/06/2017   HDL 48 09/06/2017   CHOLHDL 3.6 09/06/2017   VLDL 22 09/06/2017   LDLCALC 102 (H) 09/06/2017    Physical Findings: AIMS:  , ,  ,  ,    CIWA:    COWS:  COWS Total Score: 2  Musculoskeletal: Strength & Muscle Tone: within normal limits Gait & Station: normal Patient leans: N/A  Psychiatric Specialty Exam: Physical Exam  Nursing note and vitals reviewed. Psychiatric: Thought content normal. His affect is blunt. His speech is delayed. He is slowed, withdrawn and actively hallucinating. Cognition and memory are normal. He expresses impulsivity.    Review of Systems  Neurological: Negative.   Psychiatric/Behavioral: Positive for hallucinations.  All other systems reviewed and are negative.   Blood pressure 116/79, pulse 91, temperature 97.7 F (36.5 C), temperature source Oral, resp. rate 16, height _0  (1.702 m), weight 68.9 kg (152 lb), SpO2 100 %.Body mass index is 23.81 kg/m.  General  Appearance: Casual  Eye Contact:  Fair  Speech:  Slow  Volume:  Normal  Mood:  Euthymic  Affect:  Blunt  Thought Process:  Goal Directed and Descriptions of Associations: Intact  Orientation:  Full (Time, Place, and Person)  Thought Content:  Hallucinations: Auditory  Suicidal Thoughts:  No  Homicidal Thoughts:  No  Memory:  Immediate;   Fair Recent;   Fair Remote;   Fair  Judgement:  Fair  Insight:  Present  Psychomotor Activity:  Decreased  Concentration:  Concentration: Fair and Attention Span: Fair  Recall:  [employer] of Knowledge:  Fair  Language:  Fair  Akathisia:  No  Handed:  Right  AIMS (if indicated):     Assets:  Communication Skills Desire for Improvement Financial Resources/Insurance Housing Physical Health Resilience Social Support  ADL's:  Intact  Cognition:  WNL  Sleep:  Number of Hours: 7.15     Treatment Plan Summary: Daily contact with patient to assess and evaluate symptoms and progress in treatment and Medication management   Carl [name] is a 47 year old male with a history of schizophrenia admitted for psychotic break in the context of recent medication adjustment. The patient has been on Clozapine for schizophrenia and Depakote for seizures for many years according to his family.  #Psychosis -lower Zyprexa to 10 mg nightly  -continueClozapinetitration 150 mg tonight and tomorrow, 200 mg on Sunday  #Seizures -Depakote 500 mg TID, VPA 99 on 3/8  #Allergies -Claritin 10 mg daily  #Metabolic syndrome monitoring -lipid panel, TSH and HgbA1C are unremarkable -EKG, QTc 428  #Admission status -IVC -letter to extend commitment for another 30 days  #Disposition -discharge with family -follow up with Lorna Dibble, MD 09/09/2017, 1:21 PM

## 2017-09-09 NOTE — Plan of Care (Signed)
D: Pt denies SI/HI/AV hallucinations. Pt is pleasant and cooperative. Pt goal for today is to work on getting discharged. A: Pt was offered support and encouragement. Pt was given scheduled medications. Pt was encourage to attend groups. Q 15 minute checks were done for safety.  R:Pt attends groups and interacts well with peers and staff. Pt is taking medication. Pt has no complaints.Pt receptive to treatment and safety maintained on unit.   Progressing Activity: Interest or engagement in activities will improve 09/09/2017 0936 - Progressing by Jolene Provost, RN Sleeping patterns will improve 09/09/2017 0936 - Progressing by Jolene Provost, RN Education: Mental status will improve 09/09/2017 0936 - Progressing by Jolene Provost, RN Safety: Periods of time without injury will increase 09/09/2017 0936 - Progressing by Jolene Provost, RN Safety: Ability to remain free from injury will improve 09/09/2017 0936 - Progressing by Jolene Provost, RN

## 2017-09-09 NOTE — B[facility] Group Notes (Signed)
LCSW Group Therapy Note  09/09/2017 1:00 pm  Type of Therapy and Topic:  Group Therapy:  Feelings around Relapse and Recovery  Participation Level:  Minimal   Description of Group:    Patients in this group will discuss emotions they experience before and after a relapse. They will process how experiencing these feelings, or avoidance of experiencing them, relates to having a relapse. Facilitator will guide patients to explore emotions they have related to recovery. Patients will be encouraged to process which emotions are more powerful. They will be guided to discuss the emotional reaction significant others in their lives may have to their relapse or recovery. Patients will be assisted in exploring ways to respond to the emotions of others without this contributing to a relapse.  Therapeutic Goals: 1. Patient will identify two or more emotions that lead to a relapse for them 2. Patient will identify two emotions that result when they relapse 3. Patient will identify two emotions related to recovery 4. Patient will demonstrate ability to communicate their needs through discussion and/or role plays   Summary of Patient Progress: Carl [name] was able to participate some in today's group.  Carl [name] had to be re-directed to the topic several times during group.  Carl [name] shared that Carl [name] did not really know how Carl [name] feels when Carl [name] is relapsing.  CSW presented some emotions to Carl [name] and asked him if Carl [name] has every experienced them and Carl [name] shared that Carl [name] often feels "out of his mind" whenever Carl [name] is relapsing.  Carl [name] did not share emotions that Carl [name] has experienced related to recovery.  Carl [name] did share that Carl [name] has no problem communicating with his family about his psychiatric needs and that his family is very aware of when Carl [name] is "going downhill".    Therapeutic Modalities:   Cognitive Behavioral Therapy Solution-Focused Therapy Assertiveness Training Relapse Prevention Therapy   Devona Konig, LCSW 09/09/2017 2:39 PM

## 2017-09-09 NOTE — B[facility] Group Notes (Signed)
[facility] Group Notes:  (Nursing/MHT/Case Management/Adjunct)  Date:  09/09/2017  Time:  12:44 AM  Type of Therapy:  Group Therapy  Participation Level:  Minimal  Participation Quality:  Appropriate  Affect:  Appropriate  Cognitive:  Alert  Insight:  Good  Engagement in Group:  Engaged  Modes of Intervention:  Support  Summary of Progress/Problems:  Carl [name] 09/09/2017, 12:44 AM

## 2017-09-09 NOTE — Progress Notes (Signed)
Patient ID: Carl [name]., male   DOB: 1970-09-27, 47 y.o.   MRN: [id number] PER STATE REGULATIONS 482.30  THIS CHART WAS REVIEWED FOR MEDICAL NECESSITY WITH RESPECT TO THE PATIENT'S ADMISSION/ DURATION OF STAY.  NEXT REVIEW DATE: 09/13/2017  Chauncy Lean, RN, BSN CASE MANAGER

## 2017-09-10 NOTE — Progress Notes (Signed)
[facility] MD Progress Note  09/10/2017 1:49 PM Carl [name].  MRN:  [id number] Subjective: Follow-up 47 year old man with schizoaffective disorder.  Patient says today that he is feeling sick and run down and does not feel like eating very much.  A little bit tired.  Having some chills.  Vital signs are all normal.  Physically he appears to be without any new complaints.  Most recent labs showed a normal ammonia level.  Patient says his mood is okay.  Hallucinations are improved.  No other acute complaints no sign of seizures. Principal Problem: Schizoaffective disorder, bipolar type without good prognostic features ([place]) Diagnosis:   Patient Active Problem List   Diagnosis Date Noted  . Schizoaffective disorder, bipolar type without good prognostic features ([place]) [F25.0] 09/05/2017  . Seizures ([place]) [R56.9] 09/05/2017  . Infected postoperative seroma [IMO0002] 02/27/2015   Total Time spent with patient: 20 minutes  Past Psychiatric History: Patient has a history of schizophrenia with previous hospitalizations and failure to respond to some other antipsychotics  Past Medical History:  Past Medical History:  Diagnosis Date  . Abdominal pain   . Constipation   . Decreased visual acuity   . Nevus   . Polyuria   . Recurrent boils    scalp behind ear  . Schizophrenia ([place])   . Soft tissue mass    left scalp posterior    Past Surgical History:  Procedure Laterality Date  . MASS EXCISION Left 02/11/2015   Procedure: EXCISION MASS, left post neck;  Surgeon: Florene Glen, MD;  Location: [place];  Service: General;  Laterality: Left;   Family History:  Family History  Problem Relation Age of Onset  . Hyperlipidemia Mother   . Hypertension Father   . Diabetes Father    Family Psychiatric  History: None identified Social History:  Social History   Substance and Sexual Activity  Alcohol Use No  . Alcohol/week: 0.0 oz     Social History   Substance and Sexual Activity   Drug Use No    Social History   Socioeconomic History  . Marital status: Single    Spouse name: None  . Number of children: None  . Years of education: None  . Highest education level: None  Social Needs  . Financial resource strain: None  . Food insecurity - worry: None  . Food insecurity - inability: None  . Transportation needs - medical: None  . Transportation needs - non-medical: None  Occupational History  . None  Tobacco Use  . Smoking status: Never Smoker  . Smokeless tobacco: Never Used  Substance and Sexual Activity  . Alcohol use: No    Alcohol/week: 0.0 oz  . Drug use: No  . Sexual activity: None  Other Topics Concern  . None  Social History Narrative  . None   Additional Social History:                         Sleep: Fair  Appetite:  Poor  Current Medications: Current Facility-Administered Medications  Medication Dose Route Frequency Provider Last Rate Last Dose  . acetaminophen (TYLENOL) tablet 650 mg  650 mg Oral Q6H PRN Chauncey Mann, MD      . alum & mag hydroxide-simeth (MAALOX/MYLANTA) 200-200-20 MG/5ML suspension 30 mL  30 mL Oral Q4H PRN Chauncey Mann, MD      . cloZAPine (CLOZARIL) tablet 150 mg  150 mg Oral QHS Pucilowska, Wardell Honour, MD  150 mg at 09/09/17 2228  . [START ON 09/11/2017] cloZAPine (CLOZARIL) tablet 200 mg  200 mg Oral QHS Pucilowska, Jolanta B, MD      . divalproex (DEPAKOTE) DR tablet 500 mg  500 mg Oral Q8H Pucilowska, Jolanta B, MD   500 mg at 09/10/17 0620  . docusate sodium (COLACE) capsule 100 mg  100 mg Oral Daily Pucilowska, Jolanta B, MD   100 mg at 09/10/17 0848  . loratadine (CLARITIN) tablet 10 mg  10 mg Oral Daily Pucilowska, Jolanta B, MD   10 mg at 09/10/17 0849  . magnesium hydroxide (MILK OF MAGNESIA) suspension 30 mL  30 mL Oral Daily PRN Chauncey Mann, MD      . OLANZapine (ZYPREXA) tablet 10 mg  10 mg Oral QHS Pucilowska, Jolanta B, MD      . polyethylene glycol (MIRALAX / GLYCOLAX) packet 17 g   17 g Oral Daily Pucilowska, Jolanta B, MD   17 g at 09/10/17 6387    Lab Results:  Results for orders placed or performed during the hospital encounter of 09/05/17 (from the past 48 hour(s))  Valproic acid level     Status: None   Collection Time: 09/09/17  6:55 AM  Result Value Ref Range   Valproic Acid Lvl 99 50.0 - 100.0 ug/mL    Comment: Performed at [facility], [address] [place], [place] [address]  Ammonia     Status: None   Collection Time: 09/09/17  6:55 AM  Result Value Ref Range   Ammonia 24 9 - 35 umol/L    Comment: Performed at [facility], [address] [place], [place] [address]    Blood Alcohol level:  Lab Results  Component Value Date   [facility] <10 84/16/6063    Metabolic Disorder Labs: Lab Results  Component Value Date   HGBA1C 5.2 09/06/2017   MPG 102.54 09/06/2017   No results found for: PROLACTIN Lab Results  Component Value Date   CHOL 172 09/06/2017   TRIG 108 09/06/2017   HDL 48 09/06/2017   CHOLHDL 3.6 09/06/2017   VLDL 22 09/06/2017   LDLCALC 102 (H) 09/06/2017    Physical Findings: AIMS:  , ,  ,  ,    CIWA:    COWS:  COWS Total Score: 2  Musculoskeletal: Strength & Muscle Tone: within normal limits Gait & Station: normal Patient leans: N/A  Psychiatric Specialty Exam: Physical Exam  Nursing note and vitals reviewed. Constitutional: He appears well-developed and well-nourished.  HENT:  Head: Normocephalic and atraumatic.  Eyes: Conjunctivae are normal. Pupils are equal, round, and reactive to light.  Neck: Normal range of motion.  Cardiovascular: Regular rhythm and normal heart sounds.  Respiratory: Effort normal. No respiratory distress.  GI: Soft.  Musculoskeletal: Normal range of motion.  Neurological: He is alert.  Skin: Skin is warm and dry.  Psychiatric: His affect is blunt. His speech is delayed. He is slowed. Thought content is paranoid. He expresses impulsivity. He expresses no homicidal and no  suicidal ideation. He exhibits abnormal recent memory.    Review of Systems  Constitutional: Positive for malaise/fatigue.  HENT: Negative.   Eyes: Negative.   Respiratory: Negative.   Cardiovascular: Negative.   Gastrointestinal: Negative.   Musculoskeletal: Negative.   Skin: Negative.   Neurological: Negative.   Psychiatric/Behavioral: Negative for depression, hallucinations, substance abuse and suicidal ideas. The patient is not nervous/anxious and does not have insomnia.     Blood pressure 116/79, pulse 91,  temperature 98.1 F (36.7 C), temperature source Oral, resp. rate 18, height 5\' 7"  (1.702 m), weight 68.9 kg (152 lb), SpO2 100 %.Body mass index is 23.81 kg/m.  General Appearance: Casual  Eye Contact:  Fair  Speech:  Slow  Volume:  Decreased  Mood:  Dysphoric  Affect:  Constricted  Thought Process:  Coherent  Orientation:  Full (Time, Place, and Person)  Thought Content:  Logical  Suicidal Thoughts:  No  Homicidal Thoughts:  No  Memory:  Immediate;   Fair Recent;   Fair Remote;   Fair  Judgement:  Fair  Insight:  Fair  Psychomotor Activity:  Decreased  Concentration:  Concentration: Fair  Recall:  [employer] of Knowledge:  Fair  Language:  Fair  Akathisia:  No  Handed:  Right  AIMS (if indicated):     Assets:  Desire for Improvement Resilience  ADL's:  Impaired  Cognition:  Impaired,  Mild  Sleep:  Number of Hours: 6.3     Treatment Plan Summary: Daily contact with patient to assess and evaluate symptoms and progress in treatment, Medication management and Plan Patient is vague in his complaints but does not appear to have an obvious source of sickness.  Vitals unremarkable.  Spoke with nursing and encourage patient to eat and drink normally today.  If he is feeling sick he has Tylenol.  No other clear symptoms to target but they can let me know if anything changes.  No plan for any change today to medication that I can see.  Continue engagement and  attention by the treatment team on the unit.  Alethia Berthold, MD 09/10/2017, 1:49 PM

## 2017-09-10 NOTE — Progress Notes (Signed)
D:Pt denies SI/HI/AVH. Pt. Contracts for safety. Pt. Is able to verbalize he can remain safe while on the unit. Pt is pleasant and cooperative. Pt. has no Complaints.  Patient Interaction is appropriate. Pt. This evening observed attending to ADLs. Pt. Did not appear to be responding to internal stimuli during assessment. Pt. Reports doing, "good" this evening. Pt. Reports show eating and sleeping good.   A: Q x 15 minute observation checks were completed for safety. Patient was provided with education. Pt. Verbalizes understanding of education provided.Patient was given scheduled medications. Patient  was encourage to attend groups, participate in unit activities and continue with plan of care.   R:Patient is complaint with medication and unit procedures. Pt. Does not participate in groups.            Precautionary checks every 15 minutes for safety maintained, room free of safety hazards, patient sustains no injury or falls during this shift.

## 2017-09-10 NOTE — Plan of Care (Signed)
  Progressing Activity: Interest or engagement in activities will improve 09/10/2017 1030 - Progressing by Rise Mu, RN Note More visible in the milieu.   Education: Mental status will improve 09/10/2017 1030 - Progressing by Rise Mu, RN Note Pleasant and cooperative Safety: Periods of time without injury will increase 09/10/2017 1030 - Progressing by Rise Mu, RN Note Remains safe on the unit Safety: Ability to remain free from injury will improve 09/10/2017 1030 - Progressing by Rise Mu, RN Note Remains safe on the unit

## 2017-09-10 NOTE — [facility] Group Notes (Signed)
LCSW Group Therapy Note  09/10/2017 1:15pm  Type of Therapy and Topic: Group Therapy: Holding on to Grudges   Participation Level: Did Not Attend   Description of Group:  In this group patients will be asked to explore and define a grudge. Patients will be guided to discuss their thoughts, feelings, and reasons as to why people have grudges. Patients will process the impact grudges have on daily life and identify thoughts and feelings related to holding grudges. Facilitator will challenge patients to identify ways to let go of grudges and the benefits this provides. Patients will be confronted to address why one struggles letting go of grudges. Lastly, patients will identify feelings and thoughts related to what life would look like without grudges. This group will be process-oriented, with patients participating in exploration of their own experiences, giving and receiving support, and processing challenge from other group members.  Therapeutic Goals:  1. Patient will identify specific grudges related to their personal life.  2. Patient will identify feelings, thoughts, and beliefs around grudges.  3. Patient will identify how one releases grudges appropriately.  4. Patient will identify situations where they could have let go of the grudge, but instead chose to hold on.   Summary of Patient Progress:   Therapeutic Modalities:  Cognitive Behavioral Therapy  Solution Focused Therapy  Motivational Interviewing  Brief Therapy   Kenae Lindquist  CUEBAS-COLON, LCSW 09/10/2017 3:16 PM

## 2017-09-10 NOTE — Plan of Care (Signed)
Pt. Does not participate in groups. Pt. Presents calm, engaging, and cooperative with this Probation officer during assessment. Pt. Is able to verbalize he can remain safe while on the unit. Pt. Denies SI/HI. Pt. Contracts for safety.      Progressing Activity: Sleeping patterns will improve 09/10/2017 2335 - Progressing by Carl Ivan, RN Education: Mental status will improve 09/10/2017 2335 - Progressing by Carl Ivan, RN Safety: Periods of time without injury will increase 09/10/2017 2335 - Progressing by Carl Ivan, RN Safety: Ability to remain free from injury will improve 09/10/2017 2335 - Progressing by Carl Ivan, RN   Not Progressing Activity: Interest or engagement in activities will improve 09/10/2017 2335 - Not Progressing by Carl Ivan, RN

## 2017-09-11 NOTE — Plan of Care (Signed)
  Progressing Activity: Interest or engagement in activities will improve 09/11/2017 0958 - Progressing by Rise Mu, RN Note Visible in the milieu.  Interacting with peers and staff appropriately Education: Mental status will improve 09/11/2017 0958 - Progressing by Rise Mu, RN Note Pleasant and cooperative.  Denies SI/HI/AVH Safety: Periods of time without injury will increase 09/11/2017 0958 - Progressing by Rise Mu, RN Note Remains safe on the unit Safety: Ability to remain free from injury will improve 09/11/2017 0958 - Progressing by Rise Mu, RN Note Remains safe on the unit.

## 2017-09-11 NOTE — [facility] Group Notes (Signed)
LCSW Group Therapy Note 09/11/2017 1:15pm  Type of Therapy and Topic: Group Therapy: Feelings Around Returning Home & Establishing a Supportive Framework and Supporting Oneself When Supports Not Available  Participation Level: Minimal  Description of Group:  Patients first processed thoughts and feelings about upcoming discharge. These included fears of upcoming changes, lack of change, new living environments, judgements and expectations from others and overall stigma of mental health issues. The group then discussed the definition of a supportive framework, what that looks and feels like, and how do to discern it from an unhealthy non-supportive network. The group identified different types of supports as well as what to do when your family/friends are less than helpful or unavailable  Therapeutic Goals  1. Patient will identify one healthy supportive network that they can use at discharge. 2. Patient will identify one factor of a supportive framework and how to tell it from an unhealthy network. 3. Patient able to identify one coping skill to use when they do not have positive supports from others. 4. Patient will demonstrate ability to communicate their needs through discussion and/or role plays.  Summary of Patient Progress:  Pt reported he does not feel "too well".   Therapeutic Modalities Cognitive Behavioral Therapy Motivational Interviewing   Cheree Ditto, LCSW 09/11/2017 12:32 PM

## 2017-09-11 NOTE — Plan of Care (Signed)
Pt. Does participate in groups. Pt. Presents calm, engaging, and cooperative with this Probation officer during assessment. Pt. Is able to verbalize he can remain safe while on the unit. Pt. Denies SI/HI. Pt. Contracts for safety. Pt. Reports doing, "good" overall this evening.     Progressing Activity: Interest or engagement in activities will improve 09/11/2017 2140 - Progressing by Reyes Ivan, RN Sleeping patterns will improve 09/11/2017 2140 - Progressing by Reyes Ivan, RN Education: Mental status will improve 09/11/2017 2140 - Progressing by Reyes Ivan, RN Safety: Periods of time without injury will increase 09/11/2017 2140 - Progressing by Reyes Ivan, RN Safety: Ability to remain free from injury will improve 09/11/2017 2140 - Progressing by Reyes Ivan, RN

## 2017-09-11 NOTE — Progress Notes (Signed)
D:Pt denies SI/HI/AVH. Pt. Contracts for safety. Pt. Is able to verbalize he can remain safe while on the unit. Pt is pleasant and cooperative. Pt.has no Complaints.Patient Interaction is appropriate. Pt. This evening observed attending to ADLs. No signs of responding to internal stimuli this evening. Pt. Reports doing, "good" this evening. Pt. Reports show eating and sleeping good.  A: Q x 15 minute observation checks were completed for safety. Patient was provided with education. Pt. Verbalizes understanding of education provided.Patient was given scheduled medications. Patient was encourage to attend groups, participate in unit activities and continue with plan of care.   R:Patient is complaint with medication and unit procedures. Pt. Does participate in groups. Participates in snack time socializing with peers appropriately.             Precautionary checks every 15 minutes for safety maintained, room free of safety hazards, patient sustains no injury or falls during this shift.

## 2017-09-11 NOTE — Progress Notes (Signed)
[facility] MD Progress Note  09/11/2017 4:06 PM Carl [name].  MRN:  [id number] Subjective: Follow-up 47 year old man with schizophrenia or bipolar disorder.  Patient is calm today.  Gets up out of bed.  Taking care of his hygiene well.  Interacting some in the day room.  Denies auditory or visual hallucinations.  Not acting bizarre or agitated.  Appears to be tolerating medicine.  Vital signs stable no new labs. Principal Problem: Schizoaffective disorder, bipolar type without good prognostic features ([place]) Diagnosis:   Patient Active Problem List   Diagnosis Date Noted  . Schizoaffective disorder, bipolar type without good prognostic features ([place]) [F25.0] 09/05/2017  . Seizures ([place]) [R56.9] 09/05/2017  . Infected postoperative seroma [IMO0002] 02/27/2015   Total Time spent with patient: 15 minutes  Past Psychiatric History: Past history of schizophrenia with prior admissions.  Past Medical History:  Past Medical History:  Diagnosis Date  . Abdominal pain   . Constipation   . Decreased visual acuity   . Nevus   . Polyuria   . Recurrent boils    scalp behind ear  . Schizophrenia ([place])   . Soft tissue mass    left scalp posterior    Past Surgical History:  Procedure Laterality Date  . MASS EXCISION Left 02/11/2015   Procedure: EXCISION MASS, left post neck;  Surgeon: Florene Glen, MD;  Location: [facility];  Service: General;  Laterality: Left;   Family History:  Family History  Problem Relation Age of Onset  . Hyperlipidemia Mother   . Hypertension Father   . Diabetes Father    Family Psychiatric  History: None known Social History:  Social History   Substance and Sexual Activity  Alcohol Use No  . Alcohol/week: 0.0 oz     Social History   Substance and Sexual Activity  Drug Use No    Social History   Socioeconomic History  . Marital status: Single    Spouse name: None  . Number of children: None  . Years of education: None  . Highest education  level: None  Social Needs  . Financial resource strain: None  . Food insecurity - worry: None  . Food insecurity - inability: None  . Transportation needs - medical: None  . Transportation needs - non-medical: None  Occupational History  . None  Tobacco Use  . Smoking status: Never Smoker  . Smokeless tobacco: Never Used  Substance and Sexual Activity  . Alcohol use: No    Alcohol/week: 0.0 oz  . Drug use: No  . Sexual activity: None  Other Topics Concern  . None  Social History Narrative  . None   Additional Social History:                         Sleep: Fair  Appetite:  Good  Current Medications: Current Facility-Administered Medications  Medication Dose Route Frequency Provider Last Rate Last Dose  . acetaminophen (TYLENOL) tablet 650 mg  650 mg Oral Q6H PRN Chauncey Mann, MD      . alum & mag hydroxide-simeth (MAALOX/MYLANTA) 200-200-20 MG/5ML suspension 30 mL  30 mL Oral Q4H PRN Chauncey Mann, MD      . cloZAPine (CLOZARIL) tablet 200 mg  200 mg Oral QHS Pucilowska, Jolanta B, MD      . divalproex (DEPAKOTE) DR tablet 500 mg  500 mg Oral Q8H Pucilowska, Jolanta B, MD   500 mg at 09/11/17 0606  . docusate sodium (COLACE)  capsule 100 mg  100 mg Oral Daily Pucilowska, Jolanta B, MD   100 mg at 09/11/17 0837  . loratadine (CLARITIN) tablet 10 mg  10 mg Oral Daily Pucilowska, Jolanta B, MD   10 mg at 09/11/17 0837  . magnesium hydroxide (MILK OF MAGNESIA) suspension 30 mL  30 mL Oral Daily PRN Chauncey Mann, MD      . OLANZapine [facility]) tablet 10 mg  10 mg Oral QHS Pucilowska, Jolanta B, MD   10 mg at 09/10/17 2111  . polyethylene glycol (MIRALAX / GLYCOLAX) packet 17 g  17 g Oral Daily Pucilowska, Jolanta B, MD   17 g at 09/11/17 4235    Lab Results: No results found for this or any previous visit (from the past 48 hour(s)).  Blood Alcohol level:  Lab Results  Component Value Date   ETH <10 36/14/4315    Metabolic Disorder Labs: Lab Results   Component Value Date   HGBA1C 5.2 09/06/2017   MPG 102.54 09/06/2017   No results found for: PROLACTIN Lab Results  Component Value Date   CHOL 172 09/06/2017   TRIG 108 09/06/2017   HDL 48 09/06/2017   CHOLHDL 3.6 09/06/2017   VLDL 22 09/06/2017   LDLCALC 102 (H) 09/06/2017    Physical Findings: AIMS: Facial and Oral Movements Muscles of Facial Expression: None, normal Lips and Perioral Area: None, normal Jaw: None, normal Tongue: None, normal,Extremity Movements Upper (arms, wrists, hands, fingers): None, normal Lower (legs, knees, ankles, toes): None, normal, Trunk Movements Neck, shoulders, hips: None, normal, Overall Severity Severity of abnormal movements (highest score from questions above): None, normal Incapacitation due to abnormal movements: None, normal Patient's awareness of abnormal movements (rate only patient's report): No Awareness, Dental Status Current problems with teeth and/or dentures?: No Does patient usually wear dentures?: No  CIWA:    COWS:  COWS Total Score: 2  Musculoskeletal: Strength & Muscle Tone: within normal limits Gait & Station: normal Patient leans: N/A  Psychiatric Specialty Exam: Physical Exam  Nursing note and vitals reviewed. Constitutional: He appears well-developed and well-nourished.  HENT:  Head: Normocephalic and atraumatic.  Eyes: Conjunctivae are normal. Pupils are equal, round, and reactive to light.  Neck: Normal range of motion.  Cardiovascular: Regular rhythm and normal heart sounds.  Respiratory: Effort normal. No respiratory distress.  GI: Soft.  Musculoskeletal: Normal range of motion.  Neurological: He is alert.  Skin: Skin is warm and dry.  Psychiatric: Judgment normal. His affect is blunt. His speech is delayed. He is slowed. Thought content is not paranoid. Cognition and memory are normal. He expresses no homicidal and no suicidal ideation.    Review of Systems  Constitutional: Negative.   HENT:  Negative.   Eyes: Negative.   Respiratory: Negative.   Cardiovascular: Negative.   Gastrointestinal: Negative.   Musculoskeletal: Negative.   Skin: Negative.   Neurological: Negative.   Psychiatric/Behavioral: Negative.     Blood pressure 112/85, pulse 94, temperature 98.2 F (36.8 C), temperature source Oral, resp. rate 18, height 5\' 7"  (1.702 m), weight 68.9 kg (152 lb), SpO2 100 %.Body mass index is 23.81 kg/m.  General Appearance: Casual  Eye Contact:  Good  Speech:  Clear and Coherent  Volume:  Decreased  Mood:  Euthymic  Affect:  Constricted  Thought Process:  Goal Directed  Orientation:  Full (Time, Place, and Person)  Thought Content:  Logical  Suicidal Thoughts:  No  Homicidal Thoughts:  No  Memory:  Immediate;   Fair  Recent;   Fair Remote;   Fair  Judgement:  Fair  Insight:  Fair  Psychomotor Activity:  Decreased  Concentration:  Concentration: Fair  Recall:  [employer] of Knowledge:  Fair  Language:  Fair  Akathisia:  No  Handed:  Right  AIMS (if indicated):     Assets:  Desire for Improvement Physical Health Resilience  ADL's:  Intact  Cognition:  WNL  Sleep:  Number of Hours: 6.3     Treatment Plan Summary: Daily contact with patient to assess and evaluate symptoms and progress in treatment, Medication management and Plan Patient appears to be tolerating clozapine at higher dose.  No drooling no agitation no new complaints.  Patient is calm and cooperative.  Supportive counseling and review of plan.  No change to medicine or orders today.  Alethia Berthold, MD 09/11/2017, 4:06 PM

## 2017-09-12 LAB — CBC WITH DIFFERENTIAL/PLATELET
Basophils Absolute: 0 10*3/uL (ref 0–0.1)
Basophils Relative: 1 %
EOS ABS: 0.1 10*3/uL (ref 0–0.7)
Eosinophils Relative: 2 %
HCT: 39.1 % — ABNORMAL LOW (ref 40.0–52.0)
HEMOGLOBIN: 13.1 g/dL (ref 13.0–18.0)
LYMPHS ABS: 2.3 10*3/uL (ref 1.0–3.6)
Lymphocytes Relative: 48 %
MCH: 27.4 pg (ref 26.0–34.0)
MCHC: 33.6 g/dL (ref 32.0–36.0)
MCV: 81.6 fL (ref 80.0–100.0)
Monocytes Absolute: 0.5 10*3/uL (ref 0.2–1.0)
Monocytes Relative: 12 %
NEUTROS ABS: 1.7 10*3/uL (ref 1.4–6.5)
NEUTROS PCT: 37 %
Platelets: 205 10*3/uL (ref 150–440)
RBC: 4.79 MIL/uL (ref 4.40–5.90)
RDW: 14.8 % — ABNORMAL HIGH (ref 11.5–14.5)
WBC: 4.7 10*3/uL (ref 3.8–10.6)

## 2017-09-12 MED ORDER — DOCUSATE SODIUM 100 MG PO CAPS: 100.0000 mg | ORAL_CAPSULE | Freq: Every day | ORAL | 1 refills | Status: AC

## 2017-09-12 MED ORDER — POLYETHYLENE GLYCOL 3350 17 G PO PACK: 17.0000 g | PACK | Freq: Every day | ORAL | 1 refills | Status: DC

## 2017-09-12 MED ORDER — DIVALPROEX SODIUM 500 MG PO DR TAB: 500.0000 mg | DELAYED_RELEASE_TABLET | Freq: Two times a day (BID) | ORAL | Status: DC

## 2017-09-12 MED ORDER — LORATADINE 10 MG PO TABS: 10.0000 mg | ORAL_TABLET | Freq: Every day | ORAL | 1 refills | Status: DC

## 2017-09-12 MED ORDER — CLOZAPINE 200 MG PO TABS: 200.0000 mg | ORAL_TABLET | Freq: Every day | ORAL | 1 refills | Status: DC

## 2017-09-12 MED ORDER — TRAZODONE HCL 100 MG PO TABS: 100.0000 mg | ORAL_TABLET | Freq: Every evening | ORAL | 1 refills | Status: DC | PRN

## 2017-09-12 MED ORDER — DIVALPROEX SODIUM 500 MG PO DR TAB: 500.0000 mg | DELAYED_RELEASE_TABLET | Freq: Two times a day (BID) | ORAL | 1 refills | Status: DC

## 2017-09-12 MED ORDER — TRAZODONE HCL 100 MG PO TABS: 100.0000 mg | ORAL_TABLET | Freq: Every day | ORAL | Status: DC

## 2017-09-12 NOTE — Discharge Summary (Signed)
Physician Discharge Summary Note  Patient:  Carl [name]. is an 47 y.o., male MRN:  [id number] DOB:  March 06, 1971 Patient phone:  [phone] (home)  Patient address:   [address] [place] [address],  Total Time spent with patient: 33  Date of Admission:  09/05/2017 Date of Discharge: 09/12/2017  Reason for Admission:  Psychotic break.  History of Present Illness:   Identifying data. Carl [name] is a 47 year old mle with a history of schizophrenia.  Chief complaint. The patient unable to state.  History of present illness. Information was obtained from the patient and the chart. The patient was brought to the ER by the police after found running around naked. The patient denies. He does admit to auditory hallucinations but is unable to tell me if they are commands.He denies any other symptoms of depression or anxiety. He is not suicidal or homicidal. He does not use drugs or alcohol. He reports good treatment compliance with medications prescribed by Dr. Loni Muse. At [facility]. He is unable to name his medications. Per his family, the patient has been treated successfully with Clozapine for schizophrenia and Depakote for seizures for many years. Indeed, review of available records from [facility] confirms treatment with Clozapine. Clozapine was recently discontinued and Risperdal started by his primary psychiatrist due to "his numbers dropping".   Past psychiatric history. He was diagnosed with schizophrenia at the age of 3. His first hospitalization was at [facility]. There are some records from [facility] in the system. Reportedly did well on Clozapine for 20 years without need for hospitalization. Apparently, there were no suicide attempts.  Family psychiatric history. The patient denies any.  Social history. He is disabled from mental illness. He lives with his parents and the brother. He will return home following discharge.  Principal Problem: Schizoaffective disorder, bipolar type without  good prognostic features [facility]) Discharge Diagnoses: Patient Active Problem List   Diagnosis Date Noted  . Schizoaffective disorder, bipolar type without good prognostic features ([place]) [F25.0] 09/05/2017    Priority: High  . Seizures ([place]) [R56.9] 09/05/2017  . Infected postoperative seroma [IMO0002] 02/27/2015    Past Medical History:  Past Medical History:  Diagnosis Date  . Abdominal pain   . Constipation   . Decreased visual acuity   . Nevus   . Polyuria   . Recurrent boils    scalp behind ear  . Schizophrenia ([place])   . Soft tissue mass    left scalp posterior    Past Surgical History:  Procedure Laterality Date  . MASS EXCISION Left 02/11/2015   Procedure: EXCISION MASS, left post neck;  Surgeon: Florene Glen, MD;  Location: [facility];  Service: General;  Laterality: Left;   Family History:  Family History  Problem Relation Age of Onset  . Hyperlipidemia Mother   . Hypertension Father   . Diabetes Father    Social History:  Social History   Substance and Sexual Activity  Alcohol Use No  . Alcohol/week: 0.0 oz     Social History   Substance and Sexual Activity  Drug Use No    Social History   Socioeconomic History  . Marital status: Single    Spouse name: None  . Number of children: None  . Years of education: None  . Highest education level: None  Social Needs  . Financial resource strain: None  . Food insecurity - worry: None  . Food insecurity - inability: None  . Transportation needs - medical: None  . Transportation needs -  non-medical: None  Occupational History  . None  Tobacco Use  . Smoking status: Never Smoker  . Smokeless tobacco: Never Used  Substance and Sexual Activity  . Alcohol use: No    Alcohol/week: 0.0 oz  . Drug use: No  . Sexual activity: None  Other Topics Concern  . None  Social History Narrative  . None    Hospital Course:    Carl [name] is a 47 year old male with a history of schizophrenia  admitted for psychotic break in the context of recent medication adjustment. The patient has been on Clozapine for schizophrenia and Depakote for seizures for many years according to his family. His symptoms worsened when Clozapine was substituted with Risperdal. We restarted Clozapine and titrated dose to 200 mg nightly with full symptom resolution.  #Psychosis, resolved -continueClozapine 200 mg nightly  #Seizures -continue Depakote 500 mg BID on Depakote 500 mg TID his VPA level was 99 on 3/8  #Allergies -Claritin 10 mg daily  #Metabolic syndrome monitoring -lipid panel, TSH and HgbA1C are unremarkable -EKG, QTc 428  #Admission status -IVC  #Disposition -discharge with family -follow up with [facility]  Physical Findings: AIMS: Facial and Oral Movements Muscles of Facial Expression: None, normal Lips and Perioral Area: None, normal Jaw: None, normal Tongue: None, normal,Extremity Movements Upper (arms, wrists, hands, fingers): None, normal Lower (legs, knees, ankles, toes): None, normal, Trunk Movements Neck, shoulders, hips: None, normal, Overall Severity Severity of abnormal movements (highest score from questions above): None, normal Incapacitation due to abnormal movements: None, normal Patient's awareness of abnormal movements (rate only patient's report): No Awareness, Dental Status Current problems with teeth and/or dentures?: No Does patient usually wear dentures?: No  CIWA:    COWS:  COWS Total Score: 2  Musculoskeletal: Strength & Muscle Tone: within normal limits Gait & Station: normal Patient leans: N/A  Psychiatric Specialty Exam: Physical Exam  Nursing note and vitals reviewed. Psychiatric: He has a normal mood and affect. His speech is normal and behavior is normal. Judgment and thought content normal. Cognition and memory are normal.    Review of Systems  Neurological: Positive for seizures.  Psychiatric/Behavioral: Negative.   All other  systems reviewed and are negative.   Blood pressure 116/88, pulse 91, temperature 98.2 F (36.8 C), temperature source Oral, resp. rate 18, height 5\' 7"  (1.702 m), weight 68.9 kg (152 lb), SpO2 100 %.Body mass index is 23.81 kg/m.  General Appearance: Casual  Eye Contact:  Good  Speech:  Clear and Coherent  Volume:  Normal  Mood:  Euthymic  Affect:  Blunt  Thought Process:  Goal Directed and Descriptions of Associations: Intact  Orientation:  Full (Time, Place, and Person)  Thought Content:  WDL  Suicidal Thoughts:  No  Homicidal Thoughts:  No  Memory:  Immediate;   Fair Recent;   Fair Remote;   Fair  Judgement:  Impaired  Insight:  Shallow  Psychomotor Activity:  Normal  Concentration:  Concentration: Fair and Attention Span: Fair  Recall:  [employer] of Knowledge:  Fair  Language:  Fair  Akathisia:  No  Handed:  Right  AIMS (if indicated):     Assets:  Communication Skills Desire for Improvement Financial Resources/Insurance Housing Physical Health Resilience Social Support  ADL's:  Intact  Cognition:  WNL  Sleep:  Number of Hours: 3.45     Have you used any form of tobacco in the last 30 days? (Cigarettes, Smokeless Tobacco, Cigars, and/or Pipes): No  Has this patient used  any form of tobacco in the last 30 days? (Cigarettes, Smokeless Tobacco, Cigars, and/or Pipes) Yes, No  Blood Alcohol level:  Lab Results  Component Value Date   ETH <10 17/40/8144    Metabolic Disorder Labs:  Lab Results  Component Value Date   HGBA1C 5.2 09/06/2017   MPG 102.54 09/06/2017   No results found for: PROLACTIN Lab Results  Component Value Date   CHOL 172 09/06/2017   TRIG 108 09/06/2017   HDL 48 09/06/2017   CHOLHDL 3.6 09/06/2017   VLDL 22 09/06/2017   LDLCALC 102 (H) 09/06/2017    See Psychiatric Specialty Exam and Suicide Risk Assessment completed by Attending Physician prior to discharge.  Discharge destination:  Home  Is patient on multiple antipsychotic  therapies at discharge:  No   Has Patient had three or more failed trials of antipsychotic monotherapy by history:  No  Recommended Plan for Multiple Antipsychotic Therapies: NA  Discharge Instructions    Diet - low sodium heart healthy   Complete by:  As directed    Increase activity slowly   Complete by:  As directed      Allergies as of 09/12/2017   No Known Allergies     Medication List    TAKE these medications     Indication  clozapine 200 MG tablet Commonly known as:  CLOZARIL Take 1 tablet (200 mg total) by mouth at bedtime.  Indication:  Schizophrenia   divalproex 500 MG DR tablet Commonly known as:  DEPAKOTE Take 1 tablet (500 mg total) by mouth every 12 (twelve) hours.  Indication:  Schizophrenia   docusate sodium 100 MG capsule Commonly known as:  COLACE Take 1 capsule (100 mg total) by mouth daily. Start taking on:  09/13/2017  Indication:  Constipation   loratadine 10 MG tablet Commonly known as:  CLARITIN Take 1 tablet (10 mg total) by mouth daily. Start taking on:  09/13/2017  Indication:  Hayfever   polyethylene glycol packet Commonly known as:  MIRALAX / GLYCOLAX Take 17 g by mouth daily. Start taking on:  09/13/2017  Indication:  Constipation   traZODone 100 MG tablet Commonly known as:  DESYREL Take 1 tablet (100 mg total) by mouth at bedtime as needed for sleep.  Indication:  Trouble Sleeping      Follow-up Information    Pc, [employer] [facility]. Go on 09/16/2017.   Why:  Please atttend your follow up appointment with your provider at 11am on Friday 09/12/2017. Thank you Contact information: [address] [place] [address] [phone]           Follow-up recommendations:  Activity:  as tolerated Diet:  low sodium heart healthy Other:  keep follow up appointments  Comments:    Signed: Orson Slick, MD 09/12/2017, 9:32 AM

## 2017-09-12 NOTE — BHH Suicide Risk Assessment (Signed)
[facility] Discharge Suicide Risk Assessment   Principal Problem: Schizoaffective disorder, bipolar type without good prognostic features [facility]) Discharge Diagnoses:  Patient Active Problem List   Diagnosis Date Noted  . Schizoaffective disorder, bipolar type without good prognostic features ([place]) [F25.0] 09/05/2017    Priority: High  . Seizures ([place]) [R56.9] 09/05/2017  . Infected postoperative seroma [IMO0002] 02/27/2015    Total Time spent with patient: 35  Musculoskeletal: Strength & Muscle Tone: within normal limits Gait & Station: normal Patient leans: N/A  Psychiatric Specialty Exam: Review of Systems  Neurological: Positive for seizures.  Psychiatric/Behavioral: Negative.   All other systems reviewed and are negative.   Blood pressure 116/88, pulse 91, temperature 98.2 F (36.8 C), temperature source Oral, resp. rate 18, height 5\' 7"  (1.702 m), weight 68.9 kg (152 lb), SpO2 100 %.Body mass index is 23.81 kg/m.  General Appearance: Casual  Eye Contact::  Good  Speech:  Clear and Coherent409  Volume:  Normal  Mood:  Euthymic  Affect:  Appropriate  Thought Process:  Goal Directed and Descriptions of Associations: Intact  Orientation:  Full (Time, Place, and Person)  Thought Content:  WDL  Suicidal Thoughts:  No  Homicidal Thoughts:  No  Memory:  Immediate;   Fair Recent;   Fair Remote;   Fair  Judgement:  Impaired  Insight:  Shallow  Psychomotor Activity:  Normal  Concentration:  Fair  Recall:  [place]  Language: Fair  Akathisia:  No  Handed:  Right  AIMS (if indicated):     Assets:  Communication Skills Desire for Improvement Financial Resources/Insurance Housing Physical Health Resilience Social Support  Sleep:  Number of Hours: 3.45  Cognition: WNL  ADL's:  Intact   Mental Status Per Nursing Assessment::   On Admission:  NA  Demographic Factors:  Male  Loss Factors: NA  Historical Factors: Impulsivity  Risk Reduction  Factors:   Sense of responsibility to family, Living with another person, especially a relative, Positive social support and Positive therapeutic relationship  Continued Clinical Symptoms:  Schizophrenia:   Paranoid or undifferentiated type  Cognitive Features That Contribute To Risk:  None    Suicide Risk:  Minimal: No identifiable suicidal ideation.  Patients presenting with no risk factors but with morbid ruminations; may be classified as minimal risk based on the severity of the depressive symptoms  Follow-up Information    [facility], [employer] [facility]. Go on 09/16/2017.   Why:  Please atttend your follow up appointment with your provider at 11am on Friday 09/12/2017. Thank you Contact information: [place] [address] [phone]           Plan Of Care/Follow-up recommendations:  Activity:  as tolerated Diet:  low sodium heart healthy Other:  keep follow up appointments  Orson Slick, MD 09/12/2017, 9:17 AM

## 2017-09-12 NOTE — Progress Notes (Signed)
Patient ID: Carl Amick., male   DOB: 07-24-1970, 47 y.o.   MRN: [id number]   Discharge Note:  Patient denies SI/HI/AVH at this time. Discharge instructions, AVS, transition record, and prescriptions gone over with patient. Patient belongings returned to patient. Patient agrees to comply with medication management, follow-up visit, and outpatient therapy. Patient questions and concerns addressed and answered. Patient ambulatory off unit. Patient discharged to home with parents.

## 2017-09-12 NOTE — Progress Notes (Signed)
Clozapine monitoring Consult   47 yo male ordered clozapine  03/11 ANC 1700  Information entered into Clozapine registry and pt eligible to receive clozapine Next labs due in a week - ordered for 03/18  Pharmacy will continue to follow.   Prudy Feeler, RPh 09/12/2017

## 2017-09-12 NOTE — Progress Notes (Signed)
  [facility] Adult Case Management Discharge Plan :  Will you be returning to the same living situation after discharge:  Yes,  Home with parents At discharge, do you have transportation home?: Yes,  Mother will come pick him up Do you have the ability to pay for your medications: Yes,  Insurance  Release of information consent forms completed and in the chart;  Patient's signature needed at discharge.  Patient to Follow up at: Follow-up Information    P[facility], [employer]. Go on 09/16/2017.   Why:  Please atttend your follow up appointment with your provider at 11am on Friday 09/12/2017. Thank you Contact information: [address] [place] [address] [phone]           Next level of care provider has access to [place] and Suicide Prevention discussed: Yes,  Completed with patient. Patient refused family contact.  Have you used any form of tobacco in the last 30 days? (Cigarettes, Smokeless Tobacco, Cigars, and/or Pipes): No  Has patient been referred to the Quitline?: N/A patient is not a smoker  Patient has been referred for addiction treatment: N/A  Darin Engels, LCSW 09/12/2017, 9:27 AM

## 2018-08-16 ENCOUNTER — Other Ambulatory Visit: Payer: Self-pay

## 2018-08-16 ENCOUNTER — Emergency Department: Payer: Medicare Other

## 2018-08-16 ENCOUNTER — Emergency Department
Admission: EM | Admit: 2018-08-16 | Discharge: 2018-08-16 | Disposition: A | Discharge status: Home / Self Care | Payer: Medicare Other | Attending: Emergency Medicine | Admitting: Emergency Medicine

## 2018-08-16 DIAGNOSIS — G4710 Hypersomnia, unspecified: Secondary | ICD-10-CM

## 2018-08-16 DIAGNOSIS — R464 Slowness and poor responsiveness: Secondary | ICD-10-CM | POA: Insufficient documentation

## 2018-08-16 DIAGNOSIS — M79604 Pain in right leg: Secondary | ICD-10-CM | POA: Insufficient documentation

## 2018-08-16 DIAGNOSIS — R400 Somnolence: Secondary | ICD-10-CM | POA: Insufficient documentation

## 2018-08-16 DIAGNOSIS — R4189 Other symptoms and signs involving cognitive functions and awareness: Secondary | ICD-10-CM

## 2018-08-16 DIAGNOSIS — F209 Schizophrenia, unspecified: Secondary | ICD-10-CM | POA: Diagnosis not present

## 2018-08-16 DIAGNOSIS — M79605 Pain in left leg: Secondary | ICD-10-CM | POA: Diagnosis not present

## 2018-08-16 LAB — URINALYSIS, COMPLETE (UACMP) WITH MICROSCOPIC
Bacteria, UA: NONE SEEN
Bilirubin Urine: NEGATIVE
Glucose, UA: NEGATIVE mg/dL
Hgb urine dipstick: NEGATIVE
Ketones, ur: NEGATIVE mg/dL
Leukocytes,Ua: NEGATIVE
Nitrite: NEGATIVE
PH: 6 (ref 5.0–8.0)
Protein, ur: NEGATIVE mg/dL
SQUAMOUS EPITHELIAL / LPF: NONE SEEN (ref 0–5)
Specific Gravity, Urine: 1.004 — ABNORMAL LOW (ref 1.005–1.030)

## 2018-08-16 LAB — CBC
HCT: 41.3 % (ref 39.0–52.0)
Hemoglobin: 13.9 g/dL (ref 13.0–17.0)
MCH: 27.4 pg (ref 26.0–34.0)
MCHC: 33.7 g/dL (ref 30.0–36.0)
MCV: 81.5 fL (ref 80.0–100.0)
NRBC: 0 % (ref 0.0–0.2)
PLATELETS: 230 10*3/uL (ref 150–400)
RBC: 5.07 MIL/uL (ref 4.22–5.81)
RDW: 13.9 % (ref 11.5–15.5)
WBC: 7.5 10*3/uL (ref 4.0–10.5)

## 2018-08-16 LAB — COMPREHENSIVE METABOLIC PANEL
ALK PHOS: 29 U/L — AB (ref 38–126)
ALT: 31 U/L (ref 0–44)
AST: 89 U/L — AB (ref 15–41)
Albumin: 4 g/dL (ref 3.5–5.0)
Anion gap: 6 (ref 5–15)
BILIRUBIN TOTAL: 0.5 mg/dL (ref 0.3–1.2)
BUN: 9 mg/dL (ref 6–20)
CO2: 27 mmol/L (ref 22–32)
CREATININE: 1.11 mg/dL (ref 0.61–1.24)
Calcium: 9.5 mg/dL (ref 8.9–10.3)
Chloride: 106 mmol/L (ref 98–111)
GFR calc Af Amer: >60 mL/min (ref >60)
Glucose, Bld: 107 mg/dL — ABNORMAL HIGH (ref 70–99)
Potassium: 3.4 mmol/L — ABNORMAL LOW (ref 3.5–5.1)
Sodium: 139 mmol/L (ref 135–145)
TOTAL PROTEIN: 6.7 g/dL (ref 6.5–8.1)

## 2018-08-16 LAB — URINE DRUG SCREEN, QUALITATIVE (ARMC ONLY)
Amphetamines, Ur Screen: NOT DETECTED
BENZODIAZEPINE, UR SCRN: NOT DETECTED
Barbiturates, Ur Screen: NOT DETECTED
CANNABINOID 50 NG, UR ~~LOC~~: NOT DETECTED
Cocaine Metabolite,Ur ~~LOC~~: NOT DETECTED
MDMA (ECSTASY) UR SCREEN: NOT DETECTED
Methadone Scn, Ur: NOT DETECTED
OPIATE, UR SCREEN: NOT DETECTED
PHENCYCLIDINE (PCP) UR S: NOT DETECTED
Tricyclic, Ur Screen: NOT DETECTED

## 2018-08-16 LAB — ETHANOL: Alcohol, Ethyl (B): <10 mg/dL (ref <10)

## 2018-08-16 LAB — AMMONIA: AMMONIA: 35 umol/L (ref 9–35)

## 2018-08-16 NOTE — ED Notes (Signed)
Pt currently awaits provider for evaluation.

## 2018-08-16 NOTE — ED Provider Notes (Signed)
[facility] Emergency Department Provider Note  ____________________________________________  Time seen: Approximately 11:47 PM  I have reviewed the triage vital signs and the nursing notes.   HISTORY  Chief Complaint Leg Pain and Altered Mental Status    HPI Carl [name]. is a 48 y.o. male with a history of schizophrenia who lives with his parents, seizure disorder on Depakote, presenting for decreased responsiveness.  The patient is a poor historian and is accompanied by his parents with whom he lives.  The patient his parents report that 3 months ago, he was started on an IM shot, they do not know which one, for his schizophrenia after being stable for years on oral medications.  For the past month, they felt that he has been sleeping too much.  They report he sleeps all night and then sleeps many of the hours of the day.  The patient denies any pain, headache, or other medical symptoms.  The reason they came today was that the patient was on the couch and his mother was unable to arouse him.  A family member started CPR, but the patient was still breathing; they checked this with a mirror under his nose.  CPR was stopped and the patient did respond.  He did not have any tongue laceration or urinary or fecal incontinence.  He is not known to have had a seizure for many years and nobody witnessed a seizure today.  The patient's only complaint is some minimal bilateral lower extremity pain, which has completely resolved; he has ambulated without difficulty.    Past Medical History:  Diagnosis Date  . Abdominal pain   . Constipation   . Decreased visual acuity   . Nevus   . Polyuria   . Recurrent boils    scalp behind ear  . Schizophrenia ([place])   . Soft tissue mass    left scalp posterior    Patient Active Problem List   Diagnosis Date Noted  . Schizoaffective disorder, bipolar type without good prognostic features ([place]) 09/05/2017  . Seizures ([place])  09/05/2017  . Infected postoperative seroma 02/27/2015    Past Surgical History:  Procedure Laterality Date  . MASS EXCISION Left 02/11/2015   Procedure: EXCISION MASS, left post neck;  Surgeon: Florene Glen, MD;  Location: [place];  Service: General;  Laterality: Left;    Current Outpatient Rx  . Order #: [id number] Class: Print  . Order #: [id number] Class: Print  . Order #: [id number] Class: Print  . Order #: [id number] Class: Print  . Order #: [id number] Class: Print  . Order #: [id number] Class: Print    Allergies Patient has no known allergies.  Family History  Problem Relation Age of Onset  . Hyperlipidemia Mother   . Hypertension Father   . Diabetes Father     Social History Social History   Tobacco Use  . Smoking status: Never Smoker  . Smokeless tobacco: Never Used  Substance Use Topics  . Alcohol use: No    Alcohol/week: 0.0 standard drinks  . Drug use: No    Review of Systems Constitutional: No fever/chills.  No lightheadedness or syncope.  Positive decreased responsiveness. Eyes: No visual changes.  No blurred or double vision. ENT: No sore throat. No congestion or rhinorrhea. Cardiovascular: Denies chest pain. Denies palpitations. Respiratory: Denies shortness of breath.  No cough. Gastrointestinal: No abdominal pain.  No nausea, no vomiting.  No diarrhea.  No constipation. Genitourinary: Negative for dysuria. Musculoskeletal: Negative for back pain. Skin: Negative for rash.  Neurological: Negative for headaches. No focal numbness, tingling or weakness.  No urinary or fecal incontinence.  No witnessed seizure.  No difficulty walking.  Return to normal mental status baseline.    ____________________________________________   PHYSICAL EXAM:  VITAL SIGNS: ED Triage Vitals [08/16/18 2116]  Enc Vitals Group     BP (!) 130/94     Pulse Rate (!) 101     Resp 19     Temp 97.9 F (36.6 C)     Temp Source Oral     SpO2 98 %     Weight 165 lb  (74.8 kg)     Height 5\' 8"  (1.727 m)     Head Circumference      Peak Flow      Pain Score 10     Pain Loc      Pain Edu?      Excl. in [place]?     Constitutional: The patient is alert and has clear speech.  He is able to answer most questions appropriately.  GCS is 15. Eyes: Conjunctivae are normal.  EOMI. No scleral icterus. Head: Atraumatic. Nose: No congestion/rhinnorhea. Mouth/Throat: Mucous membranes are moist.  Neck: No stridor.  Supple.   Cardiovascular: Normal rate, regular rhythm. No murmurs, rubs or gallops.  Respiratory: Normal respiratory effort.  No accessory muscle use or retractions. Lungs CTAB.  No wheezes, rales or ronchi. Gastrointestinal: Soft, nontender and nondistended.  No guarding or rebound.  No peritoneal signs. Musculoskeletal: No LE edema. No ttp in the calves or palpable cords.  Negative Homan's sign. Neurologic:  A&Ox3.  Speech is clear.  Face and smile are symmetric.  EOMI.  Moves all extremities well. Skin:  Skin is warm, dry and intact. No rash noted. Psychiatric: Mood and affect are normal.____________________________________________   LABS (all labs ordered are listed, but only abnormal results are displayed)  Labs Reviewed  COMPREHENSIVE METABOLIC PANEL - Abnormal; Notable for the following components:      Result Value   Potassium 3.4 (*)    Glucose, Bld 107 (*)    AST 89 (*)    Alkaline Phosphatase 29 (*)    All other components within normal limits  URINALYSIS, COMPLETE (UACMP) WITH MICROSCOPIC - Abnormal; Notable for the following components:   Color, Urine STRAW (*)    APPearance CLEAR (*)    Specific Gravity, Urine 1.004 (*)    All other components within normal limits  CBC  AMMONIA  URINE DRUG SCREEN, QUALITATIVE ([facility] ONLY)  ETHANOL   ____________________________________________  EKG  ED ECG REPORT I, Anne-Caroline Mariea Clonts, the attending physician, personally viewed and interpreted this ECG.   Date: 08/16/2018  EKG Time:  2113  Rate: 89  Rhythm: normal sinus rhythm  Axis: normal  Intervals:none  ST&T Change: No STEMI  ____________________________________________  RADIOLOGY  Dg Chest 2 View  Result Date: 08/16/2018 CLINICAL DATA:  Altered mental status EXAM: CHEST - 2 VIEW COMPARISON:  02/16/2016 FINDINGS: The heart size and mediastinal contours are within normal limits and stable in appearance. Mild pulmonary hyperinflation is noted of the lungs consistent with emphysematous disease. No alveolar consolidation or pulmonary edema. The visualized skeletal structures are unremarkable. IMPRESSION: No active cardiopulmonary disease. Electronically Signed   By: Ashley Royalty M.D.   On: 08/16/2018 22:40   Ct Head Wo Contrast  Result Date: 08/16/2018 CLINICAL DATA:  Altered mental status EXAM: CT HEAD WITHOUT CONTRAST TECHNIQUE: Contiguous axial images were obtained from the base of the skull through the vertex without  intravenous contrast. COMPARISON:  None. FINDINGS: Brain: No acute intracranial abnormality. Specifically, no hemorrhage, hydrocephalus, mass lesion, acute infarction, or significant intracranial injury. Vascular: No hyperdense vessel or unexpected calcification. Skull: No acute calvarial abnormality. Sinuses/Orbits: Visualized paranasal sinuses and mastoids clear. Orbital soft tissues unremarkable. Other: None IMPRESSION: No acute intracranial abnormality. Electronically Signed   By: Rolm Baptise M.D.   On: 08/16/2018 22:41    ____________________________________________   PROCEDURES  Procedure(s) performed: None  Procedures  Critical Care performed: No ____________________________________________   INITIAL IMPRESSION / ASSESSMENT AND PLAN / ED COURSE  Pertinent labs & imaging results that were available during my care of the patient were reviewed by me and considered in my medical decision making (see chart for details).  48 y.o. male with a history of schizophrenia presenting with an  episode of unresponsiveness earlier today, as well as 1 month of increased daytime sleeping.  Overall, the patient is hemodynamically stable.  At this time he is back to his normal mental status.  He has not had any infectious symptoms.  He has had some recent medication changes, but his symptoms occurred almost 2 months after the initiation of this IM medication for schizophrenia.  I wonder if he may have had a unwitnessed seizure and was potentially postictal when they found him, but at this time there are no focal neurologic deficits and he is back to normal mental status.  We will do a thorough evaluation including cardiac work-up, infectious work-up, electrolyte work-up, and CT of the head for full evaluation.  ----------------------------------------- 11:52 PM on 08/16/2018 -----------------------------------------  The patient's work-up today has been reassuring.  The CT of his head is does not show any acute intracranial process.  His electrolytes are reassuring.  His EKG does not show ischemia or arrhythmia and his troponin is negative.  His ammonia and drug screens are also normal.  He does not have a UTI.  The cause of the patient's abnormal sleeping, as well as his decreased responsiveness today is not completely elucidated.  I have asked the patient and his family to follow-up with his primary care physician as well as his psychiatrist for medication review, and for consideration of possible breakthrough seizure.  At this time, the patient is safe for discharge home.  Follow-up instructions as well as return precautions were discussed.  ____________________________________________  FINAL CLINICAL IMPRESSION(S) / ED DIAGNOSES  Final diagnoses:  Decreased responsiveness  Excessive sleepiness  Bilateral leg pain         NEW MEDICATIONS STARTED DURING THIS VISIT:  New Prescriptions   No medications on file      Eula Listen, MD 08/16/18 2353

## 2018-08-16 NOTE — ED Notes (Signed)
Pt verbalized understanding of d/c instructions and f/u care, no further questions at this time. Pt ambulatory to the exit with steady gait accompanied by parents.

## 2018-08-16 NOTE — ED Triage Notes (Signed)
Pt BIB EMS from home, EMS was called for concern of AMS, reported pt was hard to arouse form a nap earlier today and he is reportedly "slower" to answer questions. Pt is alert and oriented x 4 in triage. Pt with Hx of Paranoid schizophrenia. Fall yesterday denies hitting head or any LOC. Currently c/o of bilateral posterior thigh pain. VSS.

## 2018-08-16 NOTE — Discharge Instructions (Addendum)
Please make an appointment to see both your primary care physician and your psychiatrist for reevaluation.  It is possible that your medications are causing sleepiness, but there are other possible causes as well.  Return to the emergency department if you develop severe pain, lightheadedness or fainting, fever, seizures, or any other symptoms concerning to you.

## 2019-02-27 IMAGING — CR DG CHEST 2V
1 series · 2 of 2 positions shown · non-contrast
Comparison: 02/16/2016

CLINICAL DATA: Altered mental status

EXAM:
CHEST - 2 VIEW

[Series 1: dg chest 2 view · 0.14mm/px · 2 of 2 slices shown]
[im 1/2]
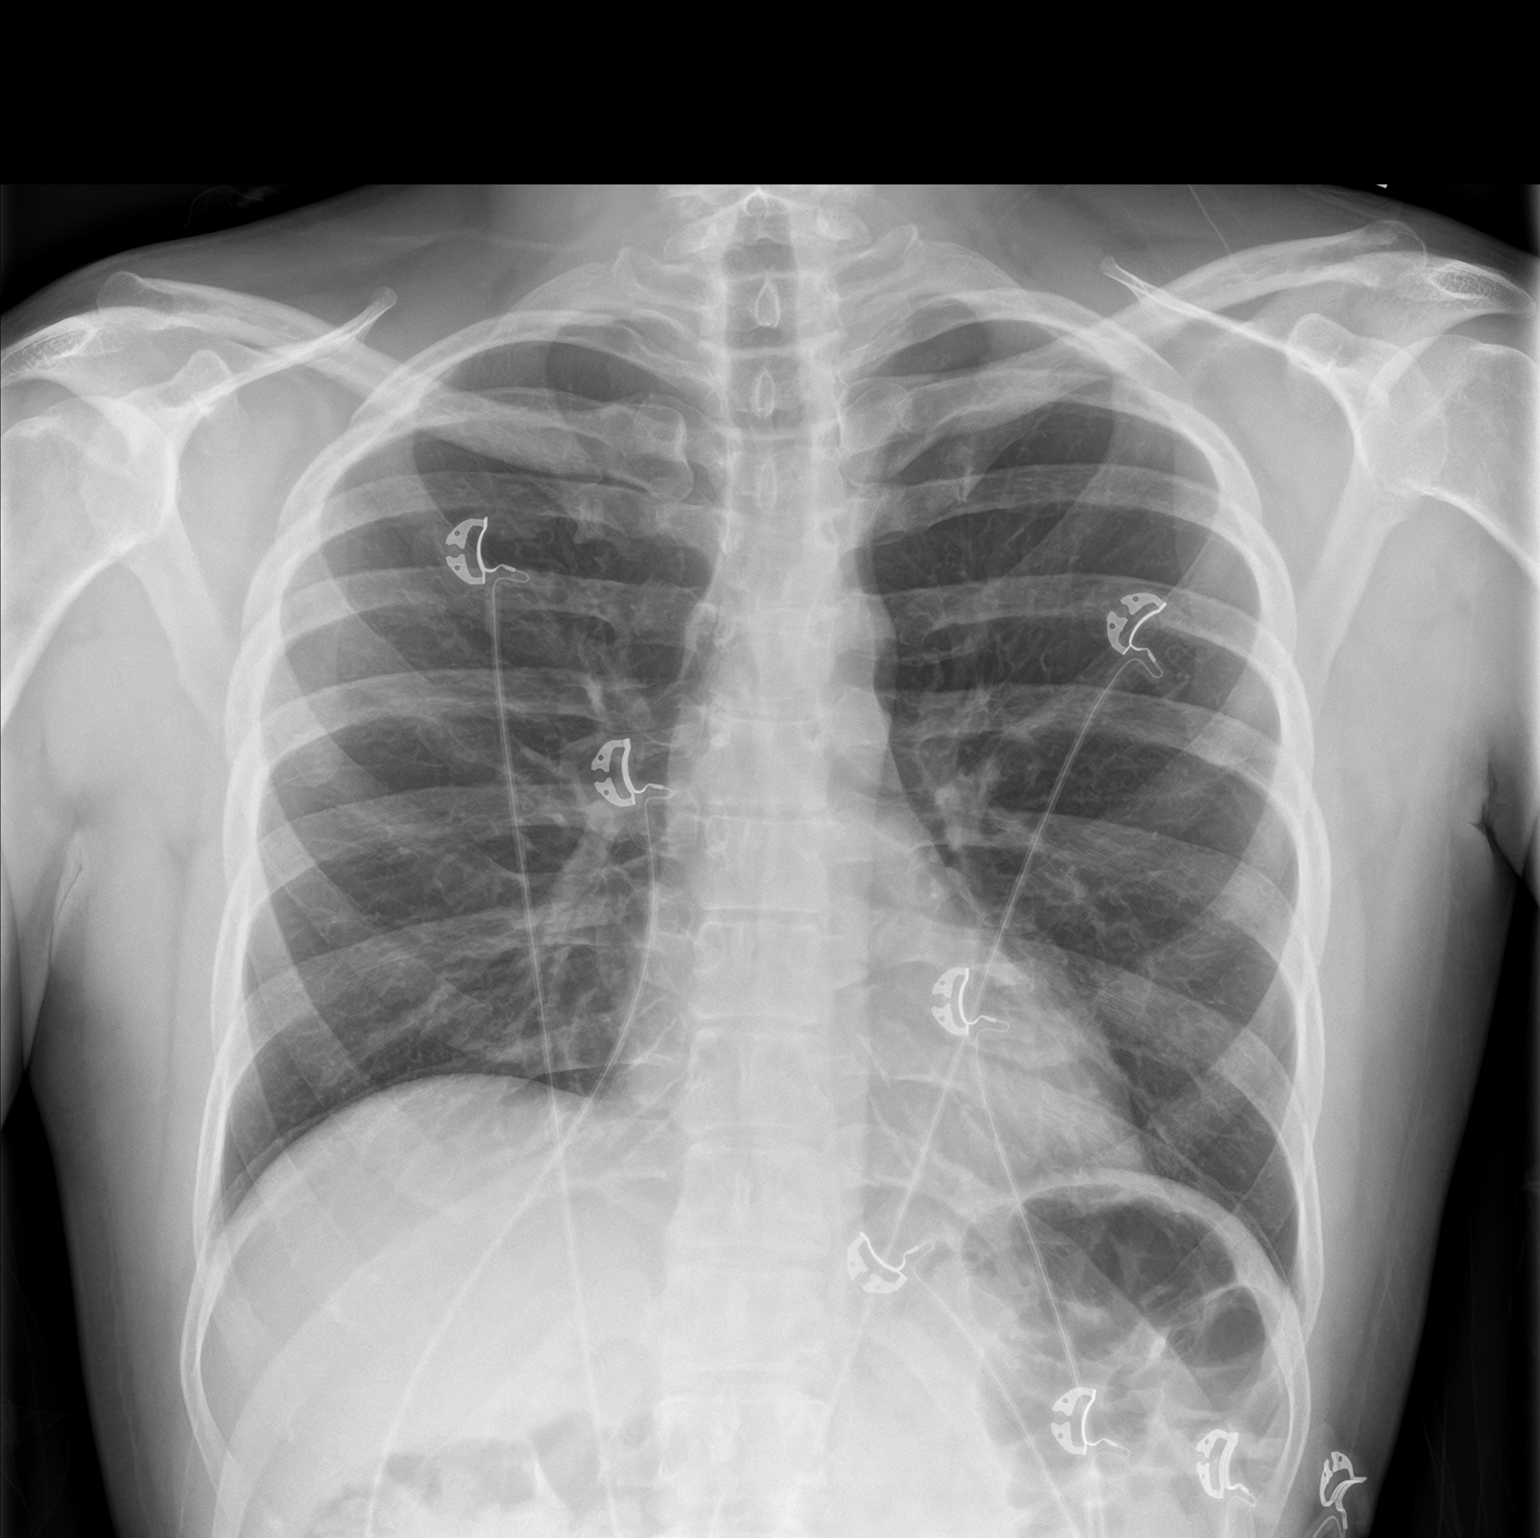
[im 2/2]
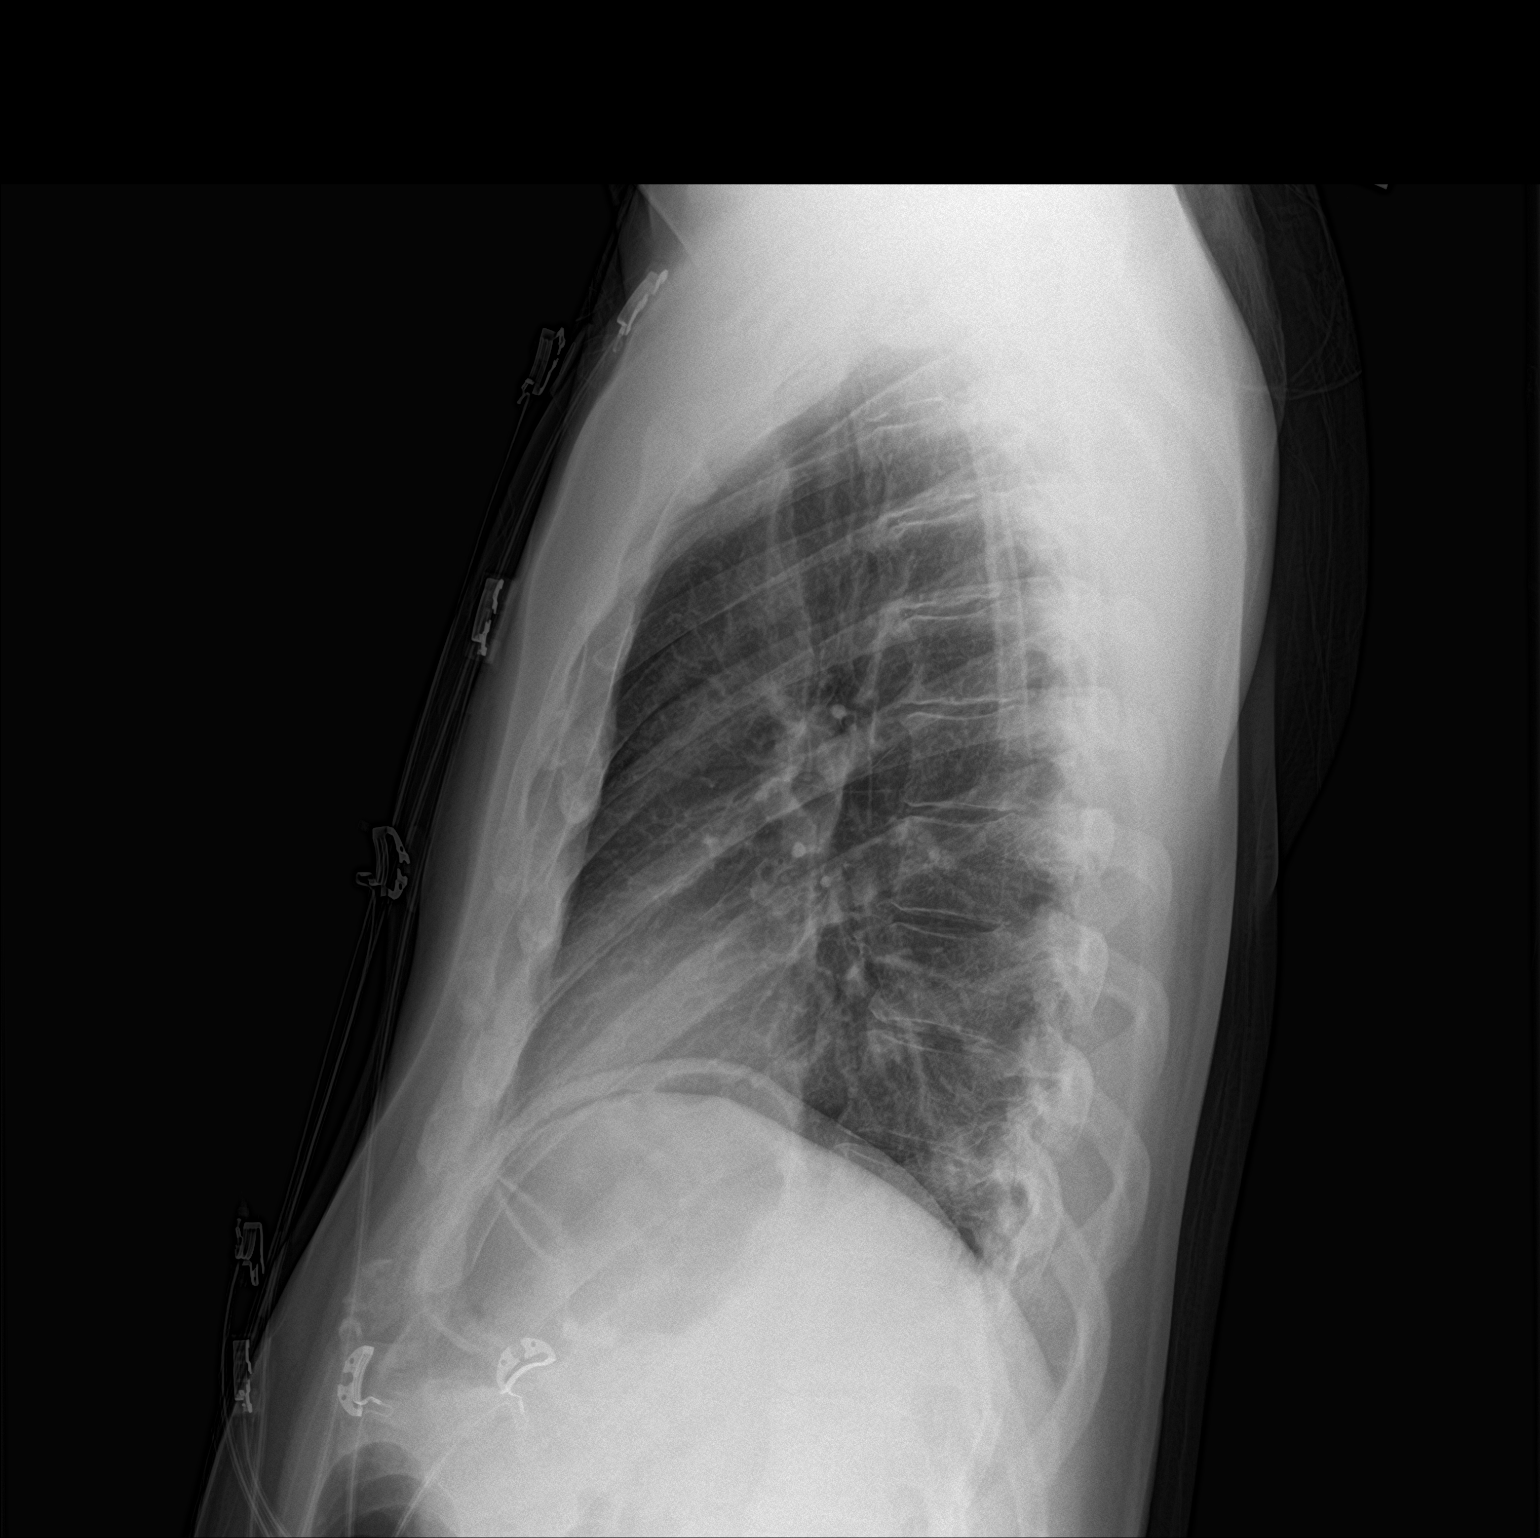

[2 of 2 positions shown; findings below may reference images not displayed]

FINDINGS: The heart size and mediastinal contours are within normal limits and
stable in appearance. Mild pulmonary hyperinflation is noted of the
lungs consistent with emphysematous disease. No alveolar
consolidation or pulmonary edema. The visualized skeletal structures
are unremarkable.
IMPRESSION: No active cardiopulmonary disease.

## 2019-04-28 ENCOUNTER — Emergency Department
Admission: EM | Admit: 2019-04-28 | Discharge: 2019-04-29 | Disposition: A | Discharge status: Other Acute Inpatient Hospital | Payer: Medicare Other | Attending: Emergency Medicine | Admitting: Emergency Medicine

## 2019-04-28 ENCOUNTER — Encounter: Payer: Self-pay | Admitting: Emergency Medicine

## 2019-04-28 ENCOUNTER — Other Ambulatory Visit: Payer: Self-pay

## 2019-04-28 DIAGNOSIS — Z046 Encounter for general psychiatric examination, requested by authority: Secondary | ICD-10-CM | POA: Diagnosis present

## 2019-04-28 DIAGNOSIS — F250 Schizoaffective disorder, bipolar type: Secondary | ICD-10-CM | POA: Diagnosis present

## 2019-04-28 DIAGNOSIS — Z79899 Other long term (current) drug therapy: Secondary | ICD-10-CM | POA: Diagnosis not present

## 2019-04-28 DIAGNOSIS — Z20828 Contact with and (suspected) exposure to other viral communicable diseases: Secondary | ICD-10-CM | POA: Diagnosis not present

## 2019-04-28 DIAGNOSIS — R440 Auditory hallucinations: Secondary | ICD-10-CM

## 2019-04-28 LAB — CBC WITH DIFFERENTIAL/PLATELET
Abs Immature Granulocytes: 0.01 10*3/uL (ref 0.00–0.07)
Basophils Absolute: 0 10*3/uL (ref 0.0–0.1)
Basophils Relative: 0 %
Eosinophils Absolute: 0 10*3/uL (ref 0.0–0.5)
Eosinophils Relative: 1 %
HCT: 44.2 % (ref 39.0–52.0)
Hemoglobin: 15 g/dL (ref 13.0–17.0)
Immature Granulocytes: 0 %
Lymphocytes Relative: 33 %
Lymphs Abs: 1.5 10*3/uL (ref 0.7–4.0)
MCH: 27.8 pg (ref 26.0–34.0)
MCHC: 33.9 g/dL (ref 30.0–36.0)
MCV: 81.9 fL (ref 80.0–100.0)
Monocytes Absolute: 0.5 10*3/uL (ref 0.1–1.0)
Monocytes Relative: 10 %
Neutro Abs: 2.5 10*3/uL (ref 1.7–7.7)
Neutrophils Relative %: 56 %
Platelets: 198 10*3/uL (ref 150–400)
RBC: 5.4 MIL/uL (ref 4.22–5.81)
RDW: 13.3 % (ref 11.5–15.5)
WBC: 4.6 10*3/uL (ref 4.0–10.5)
nRBC: 0 % (ref 0.0–0.2)

## 2019-04-28 LAB — COMPREHENSIVE METABOLIC PANEL
ALT: 24 U/L (ref 0–44)
AST: 19 U/L (ref 15–41)
Albumin: 4.5 g/dL (ref 3.5–5.0)
Alkaline Phosphatase: 36 U/L — ABNORMAL LOW (ref 38–126)
Anion gap: 12 (ref 5–15)
BUN: 15 mg/dL (ref 6–20)
CO2: 27 mmol/L (ref 22–32)
Calcium: 9.6 mg/dL (ref 8.9–10.3)
Chloride: 102 mmol/L (ref 98–111)
Creatinine, Ser: 1.17 mg/dL (ref 0.61–1.24)
GFR calc Af Amer: >60 mL/min (ref >60)
GFR calc non Af Amer: >60 mL/min (ref >60)
Glucose, Bld: 101 mg/dL — ABNORMAL HIGH (ref 70–99)
Potassium: 4.1 mmol/L (ref 3.5–5.1)
Sodium: 141 mmol/L (ref 135–145)
Total Bilirubin: 0.5 mg/dL (ref 0.3–1.2)
Total Protein: 8 g/dL (ref 6.5–8.1)

## 2019-04-28 LAB — ETHANOL: Alcohol, Ethyl (B): <10 mg/dL (ref <10)

## 2019-04-28 LAB — SARS CORONAVIRUS 2 BY RT PCR (HOSPITAL ORDER, PERFORMED IN ~~LOC~~ HOSPITAL LAB): SARS Coronavirus 2: NEGATIVE

## 2019-04-28 LAB — URINE DRUG SCREEN, QUALITATIVE (ARMC ONLY)
Amphetamines, Ur Screen: NOT DETECTED
Barbiturates, Ur Screen: NOT DETECTED
Benzodiazepine, Ur Scrn: NOT DETECTED
Cannabinoid 50 Ng, Ur ~~LOC~~: NOT DETECTED
Cocaine Metabolite,Ur ~~LOC~~: NOT DETECTED
MDMA (Ecstasy)Ur Screen: NOT DETECTED
Methadone Scn, Ur: NOT DETECTED
Opiate, Ur Screen: NOT DETECTED
Phencyclidine (PCP) Ur S: NOT DETECTED
Tricyclic, Ur Screen: NOT DETECTED

## 2019-04-28 LAB — VALPROIC ACID LEVEL: Valproic Acid Lvl: 97 ug/mL (ref 50.0–100.0)

## 2019-04-28 MED ORDER — DIVALPROEX SODIUM 500 MG PO DR TAB
500.0000 mg | DELAYED_RELEASE_TABLET | Freq: Two times a day (BID) | ORAL | Status: DC
  Administered 2019-04-28 – 2019-04-29 (×2): 500 mg via ORAL
  Filled 2019-04-28 (×2): qty 1

## 2019-04-28 MED ORDER — TRAZODONE HCL 100 MG PO TABS: 100.0000 mg | ORAL_TABLET | Freq: Every evening | ORAL | Status: DC | PRN

## 2019-04-28 MED ORDER — LORATADINE 10 MG PO TABS
10.0000 mg | ORAL_TABLET | Freq: Every day | ORAL | Status: DC
  Administered 2019-04-29: 10 mg via ORAL
  Filled 2019-04-28 (×2): qty 1

## 2019-04-28 MED ORDER — POLYETHYLENE GLYCOL 3350 17 G PO PACK
17.0000 g | PACK | Freq: Every day | ORAL | Status: DC
  Administered 2019-04-28 – 2019-04-29 (×2): 17 g via ORAL
  Filled 2019-04-28 (×2): qty 1

## 2019-04-28 MED ORDER — DOCUSATE SODIUM 100 MG PO CAPS
100.0000 mg | ORAL_CAPSULE | Freq: Every day | ORAL | Status: DC
  Administered 2019-04-29: 100 mg via ORAL
  Filled 2019-04-28 (×2): qty 1

## 2019-04-28 MED ORDER — OLANZAPINE 10 MG PO TABS
10.0000 mg | ORAL_TABLET | Freq: Two times a day (BID) | ORAL | Status: DC
  Administered 2019-04-28 – 2019-04-29 (×2): 10 mg via ORAL
  Filled 2019-04-28 (×2): qty 1

## 2019-04-28 MED ORDER — CLOZAPINE 100 MG PO TABS
200.0000 mg | ORAL_TABLET | Freq: Every day | ORAL | Status: DC
  Filled 2019-04-28: qty 2

## 2019-04-28 NOTE — ED Notes (Signed)
Pt meds brought by family and SENT TO PHARMACY.

## 2019-04-28 NOTE — Consult Note (Signed)
[facility] Psychiatry Consult   Reason for Consult:  Hallucinations  Referring Physician:  EDP Patient Identification: Carl [name]. MRN:  [id number] Principal Diagnosis: Schizoaffective disorder, bipolar type Diagnosis:  Active Problems:   Schizoaffective disorder, bipolar type without good prognostic features ([place])   Total Time spent with patient: 1 hour  Subjective:   Carl Swartzentruber. is a 48 y.o. male patient admitted with psychosis.  HPI: Patient seen and evaluated in person by this provider.  His Clozaril was stopped a year ago due to an increase in white blood count.  Medications were just changed recently and his auditory hallucinations increased.  He now has difficulty carrying on a conversation due to these voices.  He currently goes to [facility] and followed by Dr. Ali Lowe.  Patient lives with his parents who brought him to the emergency department today.  On assessment patient is clearly responding to internal stimuli, denies visual hallucinations, substance abuse, and suicidal/homicidal ideations.  Auditory hallucinations are difficult for him to understand what they are saying but his sleep and functioning is impaired related to this.  Allergic to Haldol.  Past Psychiatric History: schizoaffective disorder  Risk to Self:  yes Risk to Others:  none Prior Inpatient Therapy:  yes   Prior Outpatient Therapy:  yes  Past Medical History:  Past Medical History:  Diagnosis Date  . Abdominal pain   . Constipation   . Decreased visual acuity   . Nevus   . Polyuria   . Recurrent boils    scalp behind ear  . Schizophrenia ([place])   . Soft tissue mass    left scalp posterior    Past Surgical History:  Procedure Laterality Date  . MASS EXCISION Left 02/11/2015   Procedure: EXCISION MASS, left post neck;  Surgeon: Florene Glen, MD;  Location: [place];  Service: General;  Laterality: Left;   Family History:  Family History  Problem  Relation Age of Onset  . Hyperlipidemia Mother   . Hypertension Father   . Diabetes Father    Family Psychiatric  History: none Social History:  Social History   Substance and Sexual Activity  Alcohol Use No  . Alcohol/week: 0.0 standard drinks     Social History   Substance and Sexual Activity  Drug Use No    Social History   Socioeconomic History  . Marital status: Single    Spouse name: Not on file  . Number of children: Not on file  . Years of education: Not on file  . Highest education level: Not on file  Occupational History  . Not on file  Social Needs  . Financial resource strain: Not on file  . Food insecurity    Worry: Not on file    Inability: Not on file  . Transportation needs    Medical: Not on file    Non-medical: Not on file  Tobacco Use  . Smoking status: Never Smoker  . Smokeless tobacco: Never Used  Substance and Sexual Activity  . Alcohol use: No    Alcohol/week: 0.0 standard drinks  . Drug use: No  . Sexual activity: Not on file  Lifestyle  . Physical activity    Days per week: Not on file    Minutes per session: Not on file  . Stress: Not on file  Relationships  . Social Herbalist on phone: Not on file    Gets together: Not on file    Attends religious service:  Not on file    Active member of club or organization: Not on file    Attends meetings of clubs or organizations: Not on file    Relationship status: Not on file  Other Topics Concern  . Not on file  Social History Narrative  . Not on file   Additional Social History:    Allergies:  No Known Allergies  Labs:  Results for orders placed or performed during the hospital encounter of 04/28/19 (from the past 48 hour(s))  Comprehensive metabolic panel     Status: Abnormal   Collection Time: 04/28/19  1:36 PM  Result Value Ref Range   Sodium 141 135 - 145 mmol/L   Potassium 4.1 3.5 - 5.1 mmol/L   Chloride 102 98 - 111 mmol/L   CO2 27 22 - 32 mmol/L   Glucose,  Bld 101 (H) 70 - 99 mg/dL   BUN 15 6 - 20 mg/dL   Creatinine, Ser 1.17 0.61 - 1.24 mg/dL   Calcium 9.6 8.9 - 10.3 mg/dL   Total Protein 8.0 6.5 - 8.1 g/dL   Albumin 4.5 3.5 - 5.0 g/dL   AST 19 15 - 41 U/L   ALT 24 0 - 44 U/L   Alkaline Phosphatase 36 (L) 38 - 126 U/L   Total Bilirubin 0.5 0.3 - 1.2 mg/dL   GFR calc non Af Amer >60 >60 mL/min   GFR calc Af Amer >60 >60 mL/min   Anion gap 12 5 - 15    Comment: Performed at [facility], [address] [place], [place] [address]  Ethanol     Status: None   Collection Time: 04/28/19  1:36 PM  Result Value Ref Range   Alcohol, Ethyl (B) <10 <10 mg/dL    Comment: (NOTE) Lowest detectable limit for serum alcohol is 10 mg/dL. For medical purposes only. Performed at [facility], [address] [place], [place] [address]   Urine Drug Screen, Qualitative     Status: None   Collection Time: 04/28/19  1:36 PM  Result Value Ref Range   Tricyclic, Ur Screen NONE DETECTED NONE DETECTED   Amphetamines, Ur Screen NONE DETECTED NONE DETECTED   MDMA (Ecstasy)Ur Screen NONE DETECTED NONE DETECTED   Cocaine Metabolite,Ur [place] NONE DETECTED NONE DETECTED   Opiate, Ur Screen NONE DETECTED NONE DETECTED   Phencyclidine (PCP) Ur S NONE DETECTED NONE DETECTED   Cannabinoid 50 Ng, Ur [place] NONE DETECTED NONE DETECTED   Barbiturates, Ur Screen NONE DETECTED NONE DETECTED   Benzodiazepine, Ur Scrn NONE DETECTED NONE DETECTED   Methadone Scn, Ur NONE DETECTED NONE DETECTED    Comment: (NOTE) Tricyclics + metabolites, urine    Cutoff 1000 ng/mL Amphetamines + metabolites, urine  Cutoff 1000 ng/mL MDMA (Ecstasy), urine              Cutoff 500 ng/mL Cocaine Metabolite, urine          Cutoff 300 ng/mL Opiate + metabolites, urine        Cutoff 300 ng/mL Phencyclidine (PCP), urine         Cutoff 25 ng/mL Cannabinoid, urine                 Cutoff 50 ng/mL Barbiturates + metabolites, urine  Cutoff 200 ng/mL Benzodiazepine, urine               Cutoff 200 ng/mL Methadone, urine  Cutoff 300 ng/mL The urine drug screen provides only a preliminary, unconfirmed analytical test result and should not be used for non-medical purposes. Clinical consideration and professional judgment should be applied to any positive drug screen result due to possible interfering substances. A more specific alternate chemical method must be used in order to obtain a confirmed analytical result. Gas chromatography / mass spectrometry (GC/MS) is the preferred confirmat ory method. Performed at [facility], [address] [place], [place] [address]   CBC with Diff     Status: None   Collection Time: 04/28/19  1:36 PM  Result Value Ref Range   WBC 4.6 4.0 - 10.5 K/uL   RBC 5.40 4.22 - 5.81 MIL/uL   Hemoglobin 15.0 13.0 - 17.0 g/dL   HCT 44.2 39.0 - 52.0 %   MCV 81.9 80.0 - 100.0 fL   MCH 27.8 26.0 - 34.0 pg   MCHC 33.9 30.0 - 36.0 g/dL   RDW 13.3 11.5 - 15.5 %   Platelets 198 150 - 400 K/uL   nRBC 0.0 0.0 - 0.2 %   Neutrophils Relative % 56 %   Neutro Abs 2.5 1.7 - 7.7 K/uL   Lymphocytes Relative 33 %   Lymphs Abs 1.5 0.7 - 4.0 K/uL   Monocytes Relative 10 %   Monocytes Absolute 0.5 0.1 - 1.0 K/uL   Eosinophils Relative 1 %   Eosinophils Absolute 0.0 0.0 - 0.5 K/uL   Basophils Relative 0 %   Basophils Absolute 0.0 0.0 - 0.1 K/uL   Immature Granulocytes 0 %   Abs Immature Granulocytes 0.01 0.00 - 0.07 K/uL    Comment: Performed at [facility], [address] [place], [place] [address]    Current Facility-Administered Medications  Medication Dose Route Frequency Provider Last Rate Last Dose  . cloZAPine (CLOZARIL) tablet 200 mg  200 mg Oral QHS Patrecia Pour, NP      . divalproex (DEPAKOTE) DR tablet 500 mg  500 mg Oral Q12H Williemae Muriel Y, NP      . docusate sodium (COLACE) capsule 100 mg  100 mg Oral Daily Reece Mcbroom Y, NP      . loratadine (CLARITIN) tablet 10 mg  10 mg Oral Daily Jerika Wales,  [place] Madia Y, NP      . polyethylene glycol (MIRALAX / GLYCOLAX) packet 17 g  17 g Oral Daily Waylan Boga Y, NP      . traZODone (DESYREL) tablet 100 mg  100 mg Oral QHS PRN Patrecia Pour, NP       Current Outpatient Medications  Medication Sig Dispense Refill  . cloZAPine (CLOZARIL) 200 MG tablet Take 1 tablet (200 mg total) by mouth at bedtime. 30 tablet 1  . divalproex (DEPAKOTE) 500 MG DR tablet Take 1 tablet (500 mg total) by mouth every 12 (twelve) hours. 60 tablet 1  . docusate sodium (COLACE) 100 MG capsule Take 1 capsule (100 mg total) by mouth daily. 30 capsule 1  . loratadine (CLARITIN) 10 MG tablet Take 1 tablet (10 mg total) by mouth daily. 30 tablet 1  . polyethylene glycol (MIRALAX / GLYCOLAX) packet Take 17 g by mouth daily. 30 each 1  . traZODone (DESYREL) 100 MG tablet Take 1 tablet (100 mg total) by mouth at bedtime as needed for sleep. 30 tablet 1    Musculoskeletal: Strength & Muscle Tone: within normal limits Gait & Station: normal Patient leans: N/A  Psychiatric Specialty Exam: Physical Exam  Nursing note and vitals  reviewed. Constitutional: He is oriented to person, place, and time. He appears well-developed and well-nourished.  HENT:  Head: Normocephalic.  Neck: Normal range of motion.  Respiratory: Effort normal.  Musculoskeletal: Normal range of motion.  Neurological: He is alert and oriented to person, place, and time.  Psychiatric: His speech is normal. Judgment and thought content normal. His mood appears anxious. His affect is blunt. He is actively hallucinating. Cognition and memory are impaired.    Review of Systems  Psychiatric/Behavioral: Positive for hallucinations. The patient is nervous/anxious.   All other systems reviewed and are negative.   Blood pressure 116/86, pulse 100, temperature 98.5 F (36.9 C), temperature source Oral, resp. rate 20, height 5\' 8"  (1.727 m), weight 74.8 kg, SpO2 97 %.Body mass index is 25.09 kg/m.  General  Appearance: Casual  Eye Contact:  Fair  Speech:  Normal Rate  Volume:  Normal  Mood:  Anxious  Affect:  Blunt  Thought Process:  Coherent and Descriptions of Associations: Intact  Orientation:  Full (Time, Place, and Person)  Thought Content:  Hallucinations: Auditory  Suicidal Thoughts:  No  Homicidal Thoughts:  No  Memory:  Immediate;   Fair Recent;   Fair Remote;   Fair  Judgement:  Impaired  Insight:  Fair  Psychomotor Activity:  Decreased  Concentration:  Concentration: Fair and Attention Span: Fair  Recall:  Poor  Fund of Knowledge:  Fair  Language:  Fair  Akathisia:  No  Handed:  Right  AIMS (if indicated):     Assets:  Housing Leisure Time Physical Health Resilience Social Support  ADL's:  Intact  Cognition:  WNL  Sleep:       Treatment Plan Summary: Daily contact with patient to assess and evaluate symptoms and progress in treatment, Medication management and Plan schizoaffective disorder, bipolar type:  -Start Zyprexa 10 mg twice daily -Continue Depakote 500 mg BID Ordered Valproic acid and Clozaril levels -Seek inpatient psychiatric hospitalization for stabilization  Insomnia: -continue Trazodone 100 mg daily at bedtime   Disposition: Recommend psychiatric Inpatient admission when medically cleared.  Waylan Boga, NP 04/28/2019 2:51 PM

## 2019-04-28 NOTE — ED Provider Notes (Signed)
[facility] Emergency Department Provider Note   ____________________________________________    I have reviewed the triage vital signs and the nursing notes.   HISTORY  Chief Complaint Psychiatric Evaluation     HPI Carl [name]. is a 48 y.o. male who presents for psychiatric evaluation.  Patient has a history of schizoaffective disorder.  He reports he has had auditory hallucinations since he was 48 years old, he reports it is potentially worse recently but he is not sure.  Denies any SI or HI.  The voices are not telling him to do anything.  He denies harming himself.  Has no physical complaints at this time  Past Medical History:  Diagnosis Date  . Abdominal pain   . Constipation   . Decreased visual acuity   . Nevus   . Polyuria   . Recurrent boils    scalp behind ear  . Schizophrenia ([place])   . Soft tissue mass    left scalp posterior    Patient Active Problem List   Diagnosis Date Noted  . Schizoaffective disorder, bipolar type without good prognostic features ([place]) 09/05/2017  . Seizures ([place]) 09/05/2017  . Infected postoperative seroma 02/27/2015    Past Surgical History:  Procedure Laterality Date  . MASS EXCISION Left 02/11/2015   Procedure: EXCISION MASS, left post neck;  Surgeon: Florene Glen, MD;  Location: [place];  Service: General;  Laterality: Left;    Prior to Admission medications   Medication Sig Start Date End Date Taking? Authorizing Provider  cloZAPine (CLOZARIL) 200 MG tablet Take 1 tablet (200 mg total) by mouth at bedtime. 09/12/17   Pucilowska, Herma Ard B, MD  divalproex (DEPAKOTE) 500 MG DR tablet Take 1 tablet (500 mg total) by mouth every 12 (twelve) hours. 09/12/17   Pucilowska, Herma Ard B, MD  docusate sodium (COLACE) 100 MG capsule Take 1 capsule (100 mg total) by mouth daily. 09/13/17   Pucilowska, Herma Ard B, MD  loratadine (CLARITIN) 10 MG tablet Take 1 tablet (10 mg total) by mouth daily.  09/13/17   Pucilowska, Jolanta B, MD  polyethylene glycol (MIRALAX / GLYCOLAX) packet Take 17 g by mouth daily. 09/13/17   Pucilowska, Herma Ard B, MD  traZODone (DESYREL) 100 MG tablet Take 1 tablet (100 mg total) by mouth at bedtime as needed for sleep. 09/12/17   Pucilowska, Wardell Honour, MD     Allergies Patient has no known allergies.  Family History  Problem Relation Age of Onset  . Hyperlipidemia Mother   . Hypertension Father   . Diabetes Father     Social History Social History   Tobacco Use  . Smoking status: Never Smoker  . Smokeless tobacco: Never Used  Substance Use Topics  . Alcohol use: No    Alcohol/week: 0.0 standard drinks  . Drug use: No    Review of Systems  Constitutional: No fever/chills Eyes: No visual changes.  ENT: No sore throat. Cardiovascular: Denies chest pain. Respiratory: Denies shortness of breath. Gastrointestinal: No abdominal pain.   Genitourinary: Negative for dysuria. Musculoskeletal: Negative for back pain. Skin: Negative for rash. Neurological: Negative for headaches or weakness   ____________________________________________   PHYSICAL EXAM:  VITAL SIGNS: ED Triage Vitals  Enc Vitals Group     BP 04/28/19 1332 116/86     Pulse Rate 04/28/19 1332 100     Resp 04/28/19 1332 20     Temp 04/28/19 1332 98.5 F (36.9 C)     Temp Source 04/28/19 1332  Oral     SpO2 04/28/19 1332 97 %     Weight 04/28/19 1333 74.8 kg (165 lb)     Height 04/28/19 1333 1.727 m (5\' 8" )     Head Circumference --      Peak Flow --      Pain Score 04/28/19 1333 10     Pain Loc --      Pain Edu? --      Excl. in [place]? --     Constitutional: Alert and oriented.   Nose: No congestion/rhinnorhea. Mouth/Throat: Mucous membranes are moist.    Cardiovascular: Normal rate, regular rhythm. Grossly normal heart sounds.  Good peripheral circulation. Respiratory: Normal respiratory effort.  No retractions. Lungs CTAB. Gastrointestinal: Soft and nontender. No  distention.  No CVA tenderness.  Musculoskeletal:.  Warm and well perfused Neurologic:  Normal speech and language. No gross focal neurologic deficits are appreciated.  Skin:  Skin is warm, dry and intact. No rash noted. Psychiatric: Flat affect, mood normal, speech normal, behavior normal  ____________________________________________   LABS (all labs ordered are listed, but only abnormal results are displayed)  Labs Reviewed  COMPREHENSIVE METABOLIC PANEL - Abnormal; Notable for the following components:      Result Value   Glucose, Bld 101 (*)    Alkaline Phosphatase 36 (*)    All other components within normal limits  ETHANOL  URINE DRUG SCREEN, QUALITATIVE ([facility] ONLY)  CBC WITH DIFFERENTIAL/PLATELET  VALPROIC ACID LEVEL  CLOZAPINE (CLOZARIL)   ____________________________________________  EKG  None ____________________________________________  RADIOLOGY  None ____________________________________________   PROCEDURES  Procedure(s) performed: No  Procedures   Critical Care performed: No ____________________________________________   INITIAL IMPRESSION / ASSESSMENT AND PLAN / ED COURSE  Pertinent labs & imaging results that were available during my care of the patient were reviewed by me and considered in my medical decision making (see chart for details).  Patient overall well-appearing in no acute distress.  Lab work is reassuring, patient is medically cleared for psychiatric evaluation.  TTS and psychiatry been consulted.    ____________________________________________   FINAL CLINICAL IMPRESSION(S) / ED DIAGNOSES  Final diagnoses:  Auditory hallucination        Note:  This document was prepared using Dragon voice recognition software and may include unintentional dictation errors.   Lavonia Drafts, MD 04/28/19 1537

## 2019-04-28 NOTE — BH Assessment (Signed)
Assessment Note  Carl [name]. is an 48 y.o. male who presents to the ER due to increase of A/H. Per the patient, the voices are talking more and he is unable to understand what they are saying. Per the report of the patient's mother, approximately a year ago, his psychiatrist had to discontinue his Clozaril because of the negative side effects. Prior to that he was on the medication for approximately thirty years. He was stable and doing well. Since the change, the voices have return to the degree of him not being able to manage with them. His outpatient provider has made several changes and adjustments. When he starting a new medication, "it works for a little awhile and it's like it stop working." The effects of the most recent change with his medicine didn't last as long as the previous ones. The voice has increased to the point of him becoming afraid and his sleep pattern is irregular.  During the interview, the patient was calm, cooperative and pleasant. He was able to provide appropriate answers to the questions. He denies SI/HI and V/H. When interacting with the patient, it's apparent he's responding to internal stimuli. Several times he asked to repeat the question because the voices were distracting him. He's last inpatient treatment was 09/2017 and it was with [facility].  Diagnosis: Schizophrenia  Past Medical History:  Past Medical History:  Diagnosis Date  . Abdominal pain   . Constipation   . Decreased visual acuity   . Nevus   . Polyuria   . Recurrent boils    scalp behind ear  . Schizophrenia ([place])   . Soft tissue mass    left scalp posterior    Past Surgical History:  Procedure Laterality Date  . MASS EXCISION Left 02/11/2015   Procedure: EXCISION MASS, left post neck;  Surgeon: Florene Glen, MD;  Location: [place];  Service: General;  Laterality: Left;    Family History:  Family History  Problem Relation Age of Onset  . Hyperlipidemia Mother   .  Hypertension Father   . Diabetes Father     Social History:  reports that he has never smoked. He has never used smokeless tobacco. He reports that he does not drink alcohol or use drugs.  Additional Social History:  Alcohol / Drug Use Pain Medications: See PTA Prescriptions: See PTA Over the Counter: See PTA History of alcohol / drug use?: No history of alcohol / drug abuse Longest period of sobriety (when/how long): n/a  CIWA: CIWA-Ar BP: 116/86 Pulse Rate: 100 COWS:    Allergies: No Known Allergies  Home Medications: (Not in a hospital admission)   OB/GYN Status:  No LMP for male patient.  General Assessment Data Location of Assessment: [facility] ED TTS Assessment: In system Is this a Tele or Face-to-Face Assessment?: Face-to-Face Is this an Initial Assessment or a Re-assessment for this encounter?: Initial Assessment Patient Accompanied by:: N/A Language Other than English: No Living Arrangements: Other (Comment)(Private Home) What gender do you identify as?: Male Marital status: Single Pregnancy Status: No Can pt return to current living arrangement?: No Admission Status: Voluntary Is patient capable of signing voluntary admission?: Yes Referral Source: Self/Family/Friend Insurance type: Medicare A&B  Medical Screening Exam ([place]) Medical Exam completed: Yes  Crisis Care Plan Legal Guardian: Other:(Self) Name of Psychiatrist: Reports of none Name of Therapist: Reports of none  Education Status Is patient currently in school?: No Is the patient employed, unemployed or receiving disability?: Unemployed, Receiving  disability income  Risk to self with the past 6 months Suicidal Ideation: No Has patient been a risk to self within the past 6 months prior to admission? : No Suicidal Intent: No Has patient had any suicidal intent within the past 6 months prior to admission? : No Is patient at risk for suicide?: No Suicidal Plan?: No Has patient had any  suicidal plan within the past 6 months prior to admission? : No Access to Means: No What has been your use of drugs/alcohol within the last 12 months?: Reports of none Previous Attempts/Gestures: No How many times?: 0 Other Self Harm Risks: Reports of none Triggers for Past Attempts: None known Intentional Self Injurious Behavior: None Family Suicide History: No Recent stressful life event(s): Other (Comment)(Increase A/H) Persecutory voices/beliefs?: Yes Depression: No Depression Symptoms: Feeling worthless/self pity, Isolating Substance abuse history and/or treatment for substance abuse?: No Suicide prevention information given to non-admitted patients: Not applicable  Risk to Others within the past 6 months Homicidal Ideation: No Does patient have any lifetime risk of violence toward others beyond the six months prior to admission? : No Thoughts of Harm to Others: No Current Homicidal Intent: No Current Homicidal Plan: No Access to Homicidal Means: No Identified Victim: Reports of none History of harm to others?: No Assessment of Violence: None Noted Violent Behavior Description: Reports of none Does patient have access to weapons?: No Criminal Charges Pending?: No Does patient have a court date: No Is patient on probation?: No  Psychosis Hallucinations: Auditory Delusions: None noted  Mental Status Report Appearance/Hygiene: Unremarkable, In scrubs Eye Contact: Fair Motor Activity: Freedom of movement, Unremarkable Speech: Logical/coherent, Unremarkable Level of Consciousness: Alert Mood: Anxious, Empty, Sad Affect: Appropriate to circumstance, Sad, Anxious Anxiety Level: Minimal Thought Processes: Relevant Judgement: Unimpaired(Due to A/H) Orientation: Person, Place, Situation, Appropriate for developmental age Obsessive Compulsive Thoughts/Behaviors: Minimal  Cognitive Functioning Concentration: Decreased Memory: Recent Intact, Remote Intact Is patient IDD:  No Insight: Fair Impulse Control: Fair Appetite: Fair Have you had any weight changes? : No Change Sleep: No Change Total Hours of Sleep: 8 Vegetative Symptoms: None  ADLScreening [facility] Assessment Services) Patient's cognitive ability adequate to safely complete daily activities?: Yes Patient able to express need for assistance with ADLs?: Yes Independently performs ADLs?: Yes (appropriate for developmental age)  Prior Inpatient Therapy Prior Inpatient Therapy: Yes Prior Therapy Dates: 09/2017 Prior Therapy Facilty/Provider(s): [facility] Reason for Treatment: Psychosis  Prior Outpatient Therapy Prior Outpatient Therapy: Yes Prior Therapy Dates: Current Prior Therapy Facilty/Provider(s): [facility] Reason for Treatment: Schizophrenia Does patient have an ACCT team?: No Does patient have Intensive In-House Services?  : No Does patient have [facility] services? : No Does patient have P4CC services?: No  ADL Screening (condition at time of admission) Patient's cognitive ability adequate to safely complete daily activities?: Yes Is the patient deaf or have difficulty hearing?: No Does the patient have difficulty seeing, even when wearing glasses/contacts?: No Does the patient have difficulty concentrating, remembering, or making decisions?: No Patient able to express need for assistance with ADLs?: Yes Does the patient have difficulty dressing or bathing?: No Independently performs ADLs?: Yes (appropriate for developmental age) Does the patient have difficulty walking or climbing stairs?: No Weakness of Legs: None Weakness of Arms/Hands: None  Home Assistive Devices/Equipment Home Assistive Devices/Equipment: None  Therapy Consults (therapy consults require a physician order) PT Evaluation Needed: No OT Evalulation Needed: No SLP Evaluation Needed: No Abuse/Neglect Assessment (Assessment to be complete while patient is alone) Abuse/Neglect  Assessment Can Be  Completed: Yes Physical Abuse: Denies Verbal Abuse: Denies Sexual Abuse: Denies Exploitation of patient/patient's resources: Denies Self-Neglect: Denies Values / Beliefs Cultural Requests During Hospitalization: None Spiritual Requests During Hospitalization: None Consults Spiritual Care Consult Needed: No Social Work Consult Needed: No Regulatory affairs officer (For Healthcare) Does Patient Have a Medical Advance Directive?: No       Child/Adolescent Assessment Running Away Risk: Denies(Patient is an adult)  Disposition:  Disposition Initial Assessment Completed for this Encounter: Yes  On Site Evaluation by:   Reviewed with Physician:    Gunnar Fusi MS, LCAS, [facility], [place] Therapeutic Triage Specialist 04/28/2019 5:12 PM

## 2019-04-28 NOTE — ED Triage Notes (Signed)
Son asks mom to state his chief concern. Mom states son has complained of worse auditory hallucinations but cannot states how long. Son denies SI.

## 2019-04-28 NOTE — BH Assessment (Signed)
Referral information for Psychiatric Hospitalization faxed to;   [facility] [phone]),   . [facility] ([phone]--[phone])  . [facility] [phone] -or- [phone]) [phone]  . [facility] ([phone]---[phone]---[phone]),  . [facility] [phone], [phone], [phone] or [phone]),   . [place] [facility] [phone] or [phone])  . [facility] [phone]),   . [facility] [phone] -or- [phone]),   . [facility] [phone]).

## 2019-04-28 NOTE — ED Notes (Signed)
FIRST NURSE NOTE:  Pt arrived voluntarily with mother, mother states the patients medications aren't working. Mother arrived hyperventilating and crying.  Pt was initially in car, but agreed to come into ED. Mother states pt has hx of schizophrenia.  Mother taken with patient to triage to assist with assessment. Pt is not communicating with staff at this time with the exception of mumbling when asked his name.

## 2019-04-28 NOTE — ED Notes (Signed)
Patients Mother: Carl [name] Cell phone: [phone]

## 2019-04-28 NOTE — Consult Note (Signed)
Pharmacy consulted for Clozapine lab monitoring and REMs program reporting. 48 year old male/male in which clozapine was ordered for schizophrenia.   Assessment: Pt was on Clozaril 200 mg PO QHS.    Date  [place]  04/28/19 2500            Goal of Therapy:  CLOZAPINE MONITORING (reflects NEW REMS GUIDELINES EFFECTIVE 04/15/2014):   Check ANC at least weekly while inpatient.   For general population patients, i.e., those without benign ethnic neutropenia (BEN): --If [place] 1000-1499, increase ANC monitoring to 3x/wk --If ANC < 1000, HOLD CLOZAPINE and get psych consult     For patients with: --If [place], increase ANC monitoring to 3x/wk --If ANC < 500, HOLD CLOZAPINE and get psych consult   REMS-certified psychiatry provider can continue drug with [place] below cited thresholds if they document medical opinion that the neutropenia is not clozapine-induced (heme consult is recommended) or that risk of interrupting therapy is greater than the risk of developing severe neutropenia.   --SEE PRESCRIBING INFORMATION FOR ADDITIONAL DETAILS     Plan:  Pt is registered with clozaril REMS program and eligible to receive clozaril.   Pt should have [place] monitored weekly.   Next [place] check on 05/05/2019 @ 0800.    Kristeen Miss, PharmD Clinical Pharmacist

## 2019-04-28 NOTE — ED Notes (Signed)
Pt. Introduced to unit.  Pt. States, "I have been here before".  Pt. Told about bathroom policy.  Pt. Calm and cooperative at this time.

## 2019-04-28 NOTE — ED Notes (Signed)
Pt. Up using bathroom, pt. Returned to room with steady gait.  Pt. Requested and was given remote for tv.

## 2019-04-28 NOTE — ED Notes (Signed)
Pt. Requested and was given meal tray, drink and two additional blankets.

## 2019-04-29 DIAGNOSIS — F250 Schizoaffective disorder, bipolar type: Secondary | ICD-10-CM | POA: Diagnosis not present

## 2019-04-29 NOTE — ED Notes (Signed)
He has ambulated to the BR with a steady gait   - introduced myself to him  Pt nodded in acknowledement  NAD observed

## 2019-04-29 NOTE — ED Notes (Signed)

## 2019-04-29 NOTE — ED Notes (Signed)
emtala reviewed by this RN 

## 2019-04-29 NOTE — ED Notes (Signed)
Called transport by [place]  [phone]

## 2019-04-29 NOTE — ED Notes (Signed)
BEHAVIORAL HEALTH ROUNDING Patient sleeping: No. Patient alert and oriented: yes Behavior appropriate: Yes.  ; If no, describe:  Nutrition and fluids offered: yes Toileting and hygiene offered: Yes  Sitter present: q15 minute observations and security camera monitoring   

## 2019-04-29 NOTE — BH Assessment (Signed)
Writer call and spoke with patient's mother ([phone]) and updated her about patient transferring to [place] provided the mother with the contact information as well.

## 2019-04-29 NOTE — ED Notes (Signed)
Patient has been accepted to [facility].  Patient assigned to room TBD Accepting physician is Dr. Marnee Guarneri.  Call report to [phone].  Representative was Kia.   ER Staff is aware of it:  ER Secretary  Dr.  ER MD  [facility] Patient's Nurse

## 2019-04-29 NOTE — ED Notes (Signed)
ED  Is the patient under IVC or is there intent for IVC: Yes.   Is the patient medically cleared: Yes.   Is there vacancy in the ED BHU: Yes.   Is the population mix appropriate for patient: Yes.   Is the patient awaiting placement in inpatient or outpatient setting: Yes.  Pt to transfer to [facility] for inpt treatment   Has the patient had a psychiatric consult: Yes.   Survey of unit performed for contraband, proper placement and condition of furniture, tampering with fixtures in bathroom, shower, and each patient room: Yes.  ; Findings:  APPEARANCE/BEHAVIOR Calm and cooperative NEURO ASSESSMENT Orientation: oriented x3  Denies pain Hallucinations: auditory and visual hallucinations present  Speech: Normal Gait: normal RESPIRATORY ASSESSMENT Even  Unlabored respirations  CARDIOVASCULAR ASSESSMENT Pulses equal   regular rate  Skin warm and dry   GASTROINTESTINAL ASSESSMENT no GI complaint EXTREMITIES Full ROM  PLAN OF CARE Provide calm/safe environment. Vital signs assessed twice daily. ED BHU Assessment once each 12-hour shift. Collaborate with TTS daily or as condition indicates. Assure the ED provider has rounded once each shift. Provide and encourage hygiene. Provide redirection as needed. Assess for escalating behavior; address immediately and inform ED provider.  Assess family dynamic and appropriateness for visitation as needed: Yes.  ; If necessary, describe findings:  Educate the patient/family about BHU procedures/visitation: Yes.  ; If necessary, describe findings:

## 2019-04-29 NOTE — ED Notes (Signed)
He has ambulated back to the BR  No verbalized needs or concerns

## 2019-04-29 NOTE — ED Notes (Signed)
Pt. Came out of room and walked to bathroom, this nurse came out of nursing station to open bathroom door, pt. Turned around and went back to room, this nurse followed patient to his room and patient began urinating in corner of room.  This nurse told pt. To stop and go to bathroom.  Pt. Went back to bathroom and this nurse got a couple towels and new pair of bottom scrubs for patient.  Pt. Cleaned up urine on floor with towels.  Pt. Reminded bathroom door is locked when not being used.  Pt. Reminded to come to nursing first for bathroom use.

## 2019-04-29 NOTE — ED Notes (Signed)
Pt observed lying in bed - watching TV   Pt visualized with NAD  No verbalized needs or concerns at this time  Continue to monitor

## 2019-05-01 LAB — CLOZAPINE (CLOZARIL)
Clozapine Lvl: <20 ng/mL — ABNORMAL LOW (ref 350–650)
NorClozapine: <20 ng/mL
Total(Cloz+Norcloz): <40 ng/mL

## 2019-09-21 ENCOUNTER — Ambulatory Visit: Payer: Medicare Other | Attending: Internal Medicine

## 2019-09-21 DIAGNOSIS — Z23 Encounter for immunization: Secondary | ICD-10-CM

## 2019-09-21 NOTE — Progress Notes (Signed)
   [id number] Vaccination Clinic  Name:  Deldrick Youtz.    MRN: [id number] DOB: 07/16/1970  09/21/2019  Mr. Wendel was observed post Covid-19 immunization for 15 minutes without incident. He was provided with Vaccine Information Sheet and instruction to access the V-Safe system.   Mr. Rueda was instructed to call 911 with any severe reactions post vaccine: Marland Kitchen Difficulty breathing  . Swelling of face and throat  . A fast heartbeat  . A bad rash all over body  . Dizziness and weakness   Immunizations Administered    Name Date Dose VIS Date Route   Pfizer COVID-19 Vaccine 09/21/2019  1:19 AM 0.3 mL 06/15/2019 Intramuscular   Manufacturer: [place]   Lot: [id number]   [place]: [id number]

## 2019-10-12 ENCOUNTER — Ambulatory Visit: Payer: Medicare Other | Attending: Internal Medicine

## 2019-10-12 DIAGNOSIS — Z23 Encounter for immunization: Secondary | ICD-10-CM

## 2019-10-12 NOTE — Progress Notes (Signed)
   [id number] Vaccination Clinic  Name:  Todd Weiher.    MRN: [id number] DOB: [birth year]  10/12/2019  Mr. Steinborn was observed post Covid-19 immunization for 15 minutes without incident. He was provided with Vaccine Information Sheet and instruction to access the V-Safe system.   Mr. Thu was instructed to call 911 with any severe reactions post vaccine: Marland Kitchen Difficulty breathing  . Swelling of face and throat  . A fast heartbeat  . A bad rash all over body  . Dizziness and weakness   Immunizations Administered    Name Date Dose VIS Date Route   Pfizer COVID-19 Vaccine 10/12/2019 12:53 PM 0.3 mL 06/15/2019 Intramuscular   Manufacturer: [place]   Lot: [id number]   [place]: [id number]

## 2020-01-31 ENCOUNTER — Other Ambulatory Visit: Payer: Self-pay

## 2020-02-06 ENCOUNTER — Other Ambulatory Visit: Payer: Self-pay

## 2020-02-07 ENCOUNTER — Encounter: Payer: Self-pay | Admitting: Gastroenterology

## 2020-02-07 ENCOUNTER — Ambulatory Visit (INDEPENDENT_AMBULATORY_CARE_PROVIDER_SITE_OTHER): Payer: Medicare Other | Admitting: Gastroenterology

## 2020-02-07 ENCOUNTER — Other Ambulatory Visit: Payer: Self-pay

## 2020-02-07 VITALS — BP 113/86 | HR 97 | Temp 97.0°F | Wt 172.0 lb

## 2020-02-07 DIAGNOSIS — K561 Intussusception: Secondary | ICD-10-CM | POA: Diagnosis not present
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
## 2020-02-07 NOTE — Patient Instructions (Signed)
We or the surgeons office will call you with an appointment.

## 2020-02-08 ENCOUNTER — Other Ambulatory Visit: Payer: Self-pay

## 2020-02-08 ENCOUNTER — Telehealth: Payer: Self-pay

## 2020-02-08 DIAGNOSIS — K6389 Other specified diseases of intestine: Secondary | ICD-10-CM

## 2020-02-08 MED ORDER — NA SULFATE-K SULFATE-MG SULF 17.5-3.13-1.6 GM/177ML PO SOLN: ORAL | 0 refills | Status: DC

## 2020-02-08 NOTE — Telephone Encounter (Signed)
Dr. Bonna Gains and Dr. Hampton Abbot agreed on patient getting a colonoscopy done. Therefore, Dr. Bonna Gains gave me a v/o to schedule patient a colonoscopy. I then called patient's mother-Pamela, POA and explained what needed to be done and she agreed on her son getting a colonoscopy on 02/14/2020 at [facility]. Instructions were given to [name] and I also told her that I would be mailing the instructions as well. Mrs. [name] understood and had no further questions.

## 2020-02-12 ENCOUNTER — Other Ambulatory Visit
Admission: RE | Admit: 2020-02-12 | Discharge: 2020-02-12 | Disposition: A | Discharge status: Home / Self Care | Payer: Medicare Other | Source: Ambulatory Visit | Attending: Gastroenterology | Admitting: Gastroenterology

## 2020-02-12 ENCOUNTER — Other Ambulatory Visit: Payer: Self-pay

## 2020-02-12 DIAGNOSIS — Z20822 Contact with and (suspected) exposure to covid-19: Secondary | ICD-10-CM | POA: Diagnosis not present

## 2020-02-12 DIAGNOSIS — Z01812 Encounter for preprocedural laboratory examination: Secondary | ICD-10-CM | POA: Insufficient documentation

## 2020-02-12 LAB — SARS CORONAVIRUS 2 (TAT 6-24 HRS): SARS Coronavirus 2: NEGATIVE

## 2020-02-13 ENCOUNTER — Ambulatory Visit (INDEPENDENT_AMBULATORY_CARE_PROVIDER_SITE_OTHER): Payer: Medicare Other | Admitting: Surgery

## 2020-02-13 ENCOUNTER — Encounter: Payer: Self-pay | Admitting: Surgery

## 2020-02-13 ENCOUNTER — Other Ambulatory Visit: Payer: Self-pay

## 2020-02-13 VITALS — BP 121/79 | HR 88 | Temp 98.5°F | Resp 12 | Ht 68.0 in | Wt 174.0 lb

## 2020-02-13 DIAGNOSIS — K561 Intussusception: Secondary | ICD-10-CM
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
## 2020-02-13 NOTE — Patient Instructions (Addendum)
Keep your appointment for your Colonoscopy tomorrow.   See your appointment below. Call the office if you have any questions or concerns.

## 2020-02-14 ENCOUNTER — Encounter: Payer: Self-pay | Admitting: Gastroenterology

## 2020-02-14 ENCOUNTER — Ambulatory Visit: Payer: Medicare Other | Admitting: Anesthesiology

## 2020-02-14 ENCOUNTER — Ambulatory Visit
Admission: RE | Admit: 2020-02-14 | Discharge: 2020-02-14 | Disposition: A | Discharge status: Home / Self Care | Payer: Medicare Other | Attending: Gastroenterology | Admitting: Gastroenterology

## 2020-02-14 ENCOUNTER — Encounter
Admission: RE | Disposition: A | Discharge status: Home / Self Care | Payer: Self-pay | Source: Home / Self Care | Attending: Gastroenterology

## 2020-02-14 DIAGNOSIS — D122 Benign neoplasm of ascending colon: Secondary | ICD-10-CM | POA: Diagnosis not present

## 2020-02-14 DIAGNOSIS — R933 Abnormal findings on diagnostic imaging of other parts of digestive tract: Secondary | ICD-10-CM | POA: Diagnosis not present

## 2020-02-14 DIAGNOSIS — K635 Polyp of colon: Secondary | ICD-10-CM

## 2020-02-14 DIAGNOSIS — K5900 Constipation, unspecified: Secondary | ICD-10-CM | POA: Diagnosis not present

## 2020-02-14 DIAGNOSIS — F209 Schizophrenia, unspecified: Secondary | ICD-10-CM | POA: Insufficient documentation

## 2020-02-14 DIAGNOSIS — K6389 Other specified diseases of intestine: Secondary | ICD-10-CM | POA: Diagnosis present

## 2020-02-14 DIAGNOSIS — Z79899 Other long term (current) drug therapy: Secondary | ICD-10-CM | POA: Diagnosis not present

## 2020-02-14 DIAGNOSIS — D120 Benign neoplasm of cecum: Secondary | ICD-10-CM | POA: Insufficient documentation

## 2020-02-14 DIAGNOSIS — D374 Neoplasm of uncertain behavior of colon: Secondary | ICD-10-CM | POA: Insufficient documentation

## 2020-02-14 HISTORY — PX: COLONOSCOPY WITH PROPOFOL: SHX5780

## 2020-02-14 HISTORY — DX: Unspecified convulsions: R56.9

## 2020-02-14 SURGERY — COLONOSCOPY WITH PROPOFOL
Anesthesia: General

## 2020-02-14 MED ORDER — SODIUM CHLORIDE 0.9 % IV SOLN
INTRAVENOUS | Status: DC
  Administered 2020-02-14: 1000 mL via INTRAVENOUS

## 2020-02-14 MED ORDER — PROPOFOL 10 MG/ML IV BOLUS
INTRAVENOUS | Status: AC
  Filled 2020-02-14: qty 20

## 2020-02-14 MED ORDER — PROPOFOL 500 MG/50ML IV EMUL
INTRAVENOUS | Status: AC
  Filled 2020-02-14: qty 50

## 2020-02-14 MED ORDER — PROPOFOL 500 MG/50ML IV EMUL
INTRAVENOUS | Status: DC | PRN
  Administered 2020-02-14: 150 ug/kg/min via INTRAVENOUS

## 2020-02-14 NOTE — Anesthesia Preprocedure Evaluation (Signed)
Anesthesia Evaluation  Patient identified by MRN, date of birth, ID band Patient awake    Reviewed: Allergy & Precautions, NPO status , Patient's Chart, lab work & pertinent test results  History of Anesthesia Complications Negative for: history of anesthetic complications  Airway Mallampati: II       Dental   Pulmonary neg sleep apnea, neg COPD, Not current smoker,           Cardiovascular (-) hypertension(-) Past MI and (-) CHF (-) dysrhythmias (-) Valvular Problems/Murmurs     Neuro/Psych Seizures - (last several years ago), Well Controlled,  Bipolar Disorder Schizophrenia    GI/Hepatic Neg liver ROS, neg GERD  ,  Endo/Other  neg diabetes  Renal/GU negative Renal ROS     Musculoskeletal   Abdominal   Peds  Hematology   Anesthesia Other Findings   Reproductive/Obstetrics                             Anesthesia Physical Anesthesia Plan  ASA: III  Anesthesia Plan: General   Post-op Pain Management:    Induction: Intravenous  PONV Risk Score and Plan: 2 and Propofol infusion and TIVA  Airway Management Planned: Nasal Cannula  Additional Equipment:   Intra-op Plan:   Post-operative Plan:   Informed Consent: I have reviewed the patients History and Physical, chart, labs and discussed the procedure including the risks, benefits and alternatives for the proposed anesthesia with the patient or authorized representative who has indicated his/her understanding and acceptance.       Plan Discussed with:   Anesthesia Plan Comments:         Anesthesia Quick Evaluation

## 2020-02-14 NOTE — Transfer of Care (Signed)
Immediate Anesthesia Transfer of Care Note  Patient: Carl [name].  Procedure(s) Performed: COLONOSCOPY WITH PROPOFOL (N/A )  Patient Location: PACU  Anesthesia Type:General  Level of Consciousness: awake and sedated  Airway & Oxygen Therapy: Patient Spontanous Breathing and Patient connected to nasal cannula oxygen  Post-op Assessment: Report given to RN and Post -op Vital signs reviewed and stable  Post vital signs: Reviewed and stable  Last Vitals:  Vitals Value Taken Time  BP    Temp    Pulse    Resp    SpO2      Last Pain:  Vitals:   02/14/20 1020  TempSrc: Tympanic  PainSc: 0-No pain         Complications: No complications documented.

## 2020-02-14 NOTE — H&P (Signed)
Carl Antigua, MD [address], [place], [place], [place], [address] [address], [place], [place], [place], [address] Phone: [phone]  Fax: [phone]  Primary Care Physician:  Donnie Coffin, MD   Pre-Procedure History & Physical: HPI:  Carl [name]. is a 49 y.o. male is here for a colonoscopy.   Past Medical History:  Diagnosis Date  . Abdominal pain   . Constipation   . Decreased visual acuity   . Nevus   . Polyuria   . Recurrent boils    scalp behind ear  . Schizophrenia ([place])   . Soft tissue mass    left scalp posterior    Past Surgical History:  Procedure Laterality Date  . MASS EXCISION Left 02/11/2015   Procedure: EXCISION MASS, left post neck;  Surgeon: Florene Glen, MD;  Location: [facility];  Service: General;  Laterality: Left;    Prior to Admission medications   Medication Sig Start Date End Date Taking? Authorizing Provider  benztropine (COGENTIN) 1 MG tablet Take 1 mg by mouth at bedtime. 04/16/19   [provider]  docusate sodium (COLACE) 100 MG capsule Take 1 capsule (100 mg total) by mouth daily. 09/13/17   Pucilowska, Jolanta B, MD  fluticasone (FLONASE) 50 MCG/ACT nasal spray Place 2 sprays into the nose daily. 01/02/16   [provider]  loratadine (CLARITIN) 10 MG tablet Take 1 tablet (10 mg total) by mouth daily. 09/13/17   Pucilowska, Jolanta B, MD  Na Sulfate-K Sulfate-Mg Sulf 17.5-3.13-1.6 GM/177ML SOLN At 5 PM the day before procedure take 1 bottle and 5 hours before procedure take 1 bottle. 02/08/20   Virgel Manifold, MD  polyethylene glycol (MIRALAX / GLYCOLAX) packet Take 17 g by mouth daily. 09/13/17   Pucilowska, Jolanta B, MD  risperiDONE (RISPERDAL) 2 MG tablet Take 1 tablet by mouth daily. 01/30/20   [provider]  traZODone (DESYREL) 100 MG tablet Take 1 tablet (100 mg total) by mouth at bedtime as needed for sleep. 09/12/17   Clovis Fredrickson, MD    Allergies as of  02/08/2020  . (No Known Allergies)    Family History  Problem Relation Age of Onset  . Hyperlipidemia Mother   . Hypertension Father   . Diabetes Father     Social History   Socioeconomic History  . Marital status: Single    Spouse name: Not on file  . Number of children: Not on file  . Years of education: Not on file  . Highest education level: Not on file  Occupational History  . Not on file  Tobacco Use  . Smoking status: Never Smoker  . Smokeless tobacco: Never Used  Substance and Sexual Activity  . Alcohol use: No    Alcohol/week: 0.0 standard drinks  . Drug use: No  . Sexual activity: Not on file  Other Topics Concern  . Not on file  Social History Narrative  . Not on file   Social Determinants of Health   Financial Resource Strain:   . Difficulty of Paying Living Expenses:   Food Insecurity:   . Worried About Charity fundraiser in the Last Year:   . Arboriculturist in the Last Year:   Transportation Needs:   . Film/video editor (Medical):   Marland Kitchen Lack of Transportation (Non-Medical):   Physical Activity:   . Days of Exercise per Week:   . Minutes of Exercise per Session:   Stress:   . Feeling  of Stress :   Social Connections:   . Frequency of Communication with Friends and Family:   . Frequency of Social Gatherings with Friends and Family:   . Attends Religious Services:   . Active Member of Clubs or Organizations:   . Attends Archivist Meetings:   Marland Kitchen Marital Status:   Intimate Partner Violence:   . Fear of Current or Ex-Partner:   . Emotionally Abused:   Marland Kitchen Physically Abused:   . Sexually Abused:     Review of Systems: See HPI, otherwise negative ROS  Physical Exam: There were no vitals taken for this visit. General:   Alert,  pleasant and cooperative in NAD Head:  Normocephalic and atraumatic. Neck:  Supple; no masses or thyromegaly. Lungs:  Clear throughout to auscultation, normal respiratory effort.    Heart:  +S1, +S2,  Regular rate and rhythm, No edema. Abdomen:  Soft, nontender and nondistended. Normal bowel sounds, without guarding, and without rebound.   Neurologic:  Alert and  oriented x4;  grossly normal neurologically.  Impression/Plan: Carl [name]. is here for a colonoscopy to be performed for colon mass  Risks, benefits, limitations, and alternatives regarding  colonoscopy have been reviewed with the patient.  Questions have been answered.  All parties agreeable.   Virgel Manifold, MD  02/14/2020, 10:06 AM

## 2020-02-14 NOTE — Anesthesia Procedure Notes (Signed)
Performed by: Vaughan Sine Pre-anesthesia Checklist: Patient identified, Emergency Drugs available, Suction available and Patient being monitored Oxygen Delivery Method: Nasal cannula Preoxygenation: Pre-oxygenation with 100% oxygen Induction Type: IV induction Placement Confirmation: positive ETCO2 and CO2 detector

## 2020-02-14 NOTE — Op Note (Signed)
[facility] Gastroenterology Patient Name: Carl [name] Procedure Date: 02/14/2020 10:49 AM MRN: [id number] Account #: [id number] Date of Birth: [birth year] Admit Type: Outpatient Age: 49 Room: [facility] ENDO ROOM 4 Gender: Male Note Status: Finalized Procedure:             Colonoscopy Indications:           Exclusion of colon cancer, Abnormal CT of the GI tract Providers:             Lloyde Ludlam B. Bonna Gains MD, MD Referring MD:          Edmonia Lynch. Aycock MD (Referring MD) Medicines:             Monitored Anesthesia Care Complications:         No immediate complications. Procedure:             Pre-Anesthesia Assessment:                        - ASA Grade Assessment: II - A patient with mild                         systemic disease.                        - Prior to the procedure, a History and Physical was                         performed, and patient medications, allergies and                         sensitivities were reviewed. The patient's tolerance                         of previous anesthesia was reviewed.                        - The risks and benefits of the procedure and the                         sedation options and risks were discussed with the                         patient. All questions were answered and informed                         consent was obtained.                        - Patient identification and proposed procedure were                         verified prior to the procedure by the physician, the                         nurse, the anesthesiologist, the anesthetist and the                         technician. The procedure was verified in the  procedure room.                        After obtaining informed consent, the colonoscope was                         passed under direct vision. Throughout the procedure,                         the patient's blood pressure, pulse, and oxygen                         saturations were  monitored continuously. The                         Colonoscope was introduced through the anus and                         advanced to the the cecum, identified by appendiceal                         orifice and ileocecal valve. The colonoscopy was                         performed with ease. The patient tolerated the                         procedure well. The quality of the bowel preparation                         was good. Findings:      The perianal and digital rectal examinations were normal.      Three sessile polyps were found in the ascending colon and cecum. The       polyps were 5 to 7 mm in size. These polyps were removed with a cold       snare. Resection and retrieval were complete.      A 12 mm polyp was found in the ascending colon. The polyp was sessile.       The polyp was removed with a hot snare. Resection and retrieval were       complete.      A non-obstructing large mass was found at 60 cm proximal to the anus.       The mass was non-circumferential. No bleeding was present. This was       biopsied with a cold forceps for histology.      The exam was otherwise without abnormality.      The rectum, sigmoid colon, descending colon, transverse colon, ascending       colon and cecum appeared normal.      The retroflexed view of the distal rectum and anal verge was normal and       showed no anal or rectal abnormalities. Impression:            - Three 5 to 7 mm polyps in the ascending colon and in                         the cecum, removed with a cold snare. Resected and  retrieved.                        - One 12 mm polyp in the ascending colon, removed with                         a hot snare. Resected and retrieved.                        - Rule out malignancy, tumor at 60 cm proximal to the                         anus. Biopsied.                        - The examination was otherwise normal.                        - The rectum, sigmoid  colon, descending colon,                         transverse colon, ascending colon and cecum are normal.                        - The distal rectum and anal verge are normal on                         retroflexion view. Recommendation:        - Discharge patient to home (with escort).                        - Advance diet as tolerated.                        - Continue present medications.                        - Await pathology results.                        - The findings and recommendations were discussed with                         the patient.                        - The findings and recommendations were discussed with                         the patient's family.                        - Return to primary care physician as previously                         scheduled. Procedure Code(s):     --- Professional ---                        [phone], Colonoscopy, flexible; with removal of  tumor(s), polyp(s), or other lesion(s) by snare                         technique                        45380, 59, Colonoscopy, flexible; with biopsy, single                         or multiple Diagnosis Code(s):     --- Professional ---                        K63.5, Polyp of colon                        D49.0, Neoplasm of unspecified behavior of digestive                         system                        R93.3, Abnormal findings on diagnostic imaging of                         other parts of digestive tract CPT copyright 2019 American Medical Association. All rights reserved. The codes documented in this report are preliminary and upon coder review may  be revised to meet current compliance requirements.  [name], MD Margretta Sidle B. Bonna Gains MD, MD 02/14/2020 11:51:22 AM This report has been signed electronically. Number of Addenda: 0 Note Initiated On: 02/14/2020 10:49 AM Scope Withdrawal Time: 0 hours 30 minutes 14 seconds  Total Procedure Duration: 0 hours 38  minutes 27 seconds  Estimated Blood Loss:  Estimated blood loss: none.      [name]

## 2020-02-15 ENCOUNTER — Encounter: Payer: Self-pay | Admitting: Gastroenterology

## 2020-02-15 LAB — SURGICAL PATHOLOGY

## 2020-02-15 NOTE — Anesthesia Postprocedure Evaluation (Signed)
Anesthesia Post Note  Patient: Carl [name].  Procedure(s) Performed: COLONOSCOPY WITH PROPOFOL (N/A )  Patient location during evaluation: Endoscopy Anesthesia Type: General Level of consciousness: awake and alert Pain management: pain level controlled Vital Signs Assessment: post-procedure vital signs reviewed and stable Respiratory status: spontaneous breathing and respiratory function stable Cardiovascular status: stable Anesthetic complications: no   No complications documented.   Last Vitals:  Vitals:   02/14/20 1153 02/14/20 1203  BP: 103/74 111/78  Pulse: 71 (!) 59  Resp: 14 12  Temp:    SpO2: 96% 100%    Last Pain:  Vitals:   02/15/20 0741  TempSrc:   PainSc: 0-No pain                 Analeia Ismael K

## 2020-02-15 NOTE — Progress Notes (Signed)
No malignancy or high grade dysplasia noted on the biopsy of the colon mass. Results were discussed with the patient, and his mother, Jeannene Patella, at his request. She was available on speakerphone with him at the same time. However, the possibility of there being cancerous cells present elsewhere in the large mass were discussed as well. Patient has an appointment with surgery next week, and was encouraged to keep that appointment to discuss surgical plans for this mass. Maritza please set up phone follow up in 3 months.

## 2020-02-18 ENCOUNTER — Telehealth: Payer: Self-pay

## 2020-02-18 NOTE — Telephone Encounter (Signed)
-----   Message from Carl Manifold, Carl [name] sent at 02/15/2020  4:33 PM EDT ----- No malignancy or high grade dysplasia noted on the biopsy of the colon mass. Results were discussed with the patient, and his mother, Carl [name], at his request. She was available on speakerphone with him at the same time. However, the possibility of there being cancerous cells present elsewhere in the large mass were discussed as well. Patient has an appointment with surgery next week, and was encouraged to keep that appointment to discuss surgical plans for this mass. Raylinn Kosar please set up phone follow up in 3 months.

## 2020-02-18 NOTE — Telephone Encounter (Signed)
Called patient's mother to schedule a follow up appointment with Dr. Bonna Gains. Follow up appointment will be on 05/20/2020 at 2:15 PM.

## 2020-02-20 ENCOUNTER — Other Ambulatory Visit: Payer: Self-pay | Admitting: Radiology

## 2020-02-20 ENCOUNTER — Encounter: Payer: Self-pay | Admitting: Surgery

## 2020-02-20 ENCOUNTER — Telehealth (INDEPENDENT_AMBULATORY_CARE_PROVIDER_SITE_OTHER): Payer: Medicare Other | Admitting: Gastroenterology

## 2020-02-20 ENCOUNTER — Other Ambulatory Visit: Payer: Self-pay

## 2020-02-20 ENCOUNTER — Ambulatory Visit (INDEPENDENT_AMBULATORY_CARE_PROVIDER_SITE_OTHER): Payer: Medicare Other | Admitting: Surgery

## 2020-02-20 ENCOUNTER — Encounter: Payer: Self-pay | Admitting: Gastroenterology

## 2020-02-20 VITALS — BP 124/82 | HR 105 | Temp 97.9°F | Resp 12 | Wt 175.8 lb

## 2020-02-20 DIAGNOSIS — K561 Intussusception: Secondary | ICD-10-CM | POA: Diagnosis not present

## 2020-02-20 DIAGNOSIS — K635 Polyp of colon: Secondary | ICD-10-CM

## 2020-02-20 DIAGNOSIS — D126 Benign neoplasm of colon, unspecified: Secondary | ICD-10-CM | POA: Diagnosis not present

## 2020-02-20 MED ORDER — ERYTHROMYCIN BASE 500 MG PO TABS: ORAL_TABLET | ORAL | 0 refills | Status: DC

## 2020-02-20 MED ORDER — NEOMYCIN SULFATE 500 MG PO TABS
ORAL_TABLET | ORAL | 0 refills | Status: DC
Start: 2020-02-20 — End: 2020-03-11

## 2020-02-20 MED ORDER — METRONIDAZOLE 500 MG PO TABS
ORAL_TABLET | ORAL | 0 refills | Status: DC
Start: 2020-02-20 — End: 2020-03-11

## 2020-02-20 MED ORDER — NEOMYCIN SULFATE 500 MG PO TABS
ORAL_TABLET | ORAL | 0 refills | Status: DC
Start: 2020-02-20 — End: 2020-02-20

## 2020-02-20 MED ORDER — POLYETHYLENE GLYCOL 3350 17 GM/SCOOP PO POWD
ORAL | 0 refills | Status: DC
Start: 2020-02-20 — End: 2020-03-11

## 2020-02-20 MED ORDER — NA SULFATE-K SULFATE-MG SULF 17.5-3.13-1.6 GM/177ML PO SOLN: ORAL | 0 refills | Status: DC

## 2020-02-20 MED ORDER — BISACODYL 5 MG PO TBEC
DELAYED_RELEASE_TABLET | ORAL | 0 refills | Status: DC
Start: 2020-02-20 — End: 2020-03-11

## 2020-02-20 MED ORDER — POLYETHYLENE GLYCOL 3350 17 GM/SCOOP PO POWD
ORAL | 0 refills | Status: DC
Start: 2020-02-20 — End: 2020-02-20

## 2020-02-20 MED ORDER — BISACODYL 5 MG PO TBEC
DELAYED_RELEASE_TABLET | ORAL | 0 refills | Status: DC
Start: 2020-02-20 — End: 2020-02-20

## 2020-02-20 NOTE — Patient Instructions (Signed)
Pick up your medications at your local pharmacy.   Our surgery scheduler will contact you to schedule your surgery. Please have the [facility] Sheet available when she calls you.   You will need to complete a Miralax Colon Surgery Prep. Follow instructions on the white sheet that was provided to you.   Please call the office if you have any questions or concerns.  Laparoscopic Colectomy Laparoscopic colectomy is surgery to remove part or all of the large intestine (colon). This procedure may be used to treat several conditions, including:  Inflammation and infection of the colon (diverticulitis).  Tumors or masses in the colon.  Inflammatory bowel disease, such as Crohn disease or ulcerative colitis. Colectomy is an option when symptoms cannot be controlled with medicines.  Bleeding from the colon that cannot be controlled by another method.  Blockage or obstruction of the colon. Tell a health care provider about:  Any allergies you have.  All medicines you are taking, including vitamins, herbs, eye drops, creams, and over-the-counter medicines.  Any problems you or family members have had with anesthetic medicines.  Any blood disorders you have.  Any surgeries you have had.  Any medical conditions you have. What are the risks? Generally, this is a safe procedure. However, problems may occur, including:  Infection.  Bleeding.  Allergic reactions to medicines or dyes.  Damage to other structures or organs.  Leaking from where the colon was sewn together.  Future blockage of the small intestines from scar tissue. Another surgery may be needed to repair this.  Needing to convert to an open procedure. Complications such as damage to other organs or excessive bleeding may require the surgeon to convert from a laparoscopic procedure to an open procedure. This involves making a larger incision in the abdomen. What happens before the procedure? Staying hydrated Follow  instructions from your health care provider about hydration, which may include:  Up to 2 hours before the procedure - you may continue to drink clear liquids, such as water, clear fruit juice, black coffee, and plain tea. Eating and drinking restrictions Follow instructions from your health care provider about eating and drinking, which may include:  8 hours before the procedure - stop eating heavy meals, meals with high fiber, or foods such as meat, fried foods, or fatty foods.  6 hours before the procedure - stop eating light meals or foods, such as toast or cereal.  6 hours before the procedure - stop drinking milk or drinks that contain milk.  2 hours before the procedure - stop drinking clear liquids. Medicines  Ask your health care provider about: ? Changing or stopping your regular medicines. This is especially important if you are taking diabetes medicines or blood thinners. ? Taking medicines such as aspirin and ibuprofen. These medicines can thin your blood. Do not take these medicines before your procedure if your health care provider instructs you not to.  You may be given antibiotic medicine to clean out bacteria from your colon. Follow the directions carefully and take the medicine at the correct time. General instructions  You may be prescribed an oral bowel prep to clean out your colon in preparation for the surgery: ? Follow instructions from your health care provider about how to do this. ? Do not eat or drink anything else after you have started the bowel prep, unless your health care provider tells you it is safe to do so.  Do not use any products that contain nicotine or tobacco, such as  cigarettes and e-cigarettes. If you need help quitting, ask your health care provider. What happens during the procedure?  To reduce your risk of infection: ? Your health care team will wash or sanitize their hands. ? Your skin will be washed with soap.  An IV tube will be  inserted into one of your veins to deliver fluid and medication.  You will be given one of the following: ? A medicine to help you relax (sedative). ? A medicine to make you fall asleep (general anesthetic).  Small monitors will be connected to your body. They will be used to check your heart, blood pressure, and oxygen level.  A breathing tube may be placed into your lungs during the procedure.  A thin, flexible tube (catheter) will be placed into your bladder to drain urine.  A tube may be placed through your nose and into your stomach to drain stomach fluids (nasogastric tube, or NG tube).  Your abdomen will be filled with air so it expands. This gives the surgeon more room to operate and makes your organs easier to see.  Several small cuts (incisions) will be made in your abdomen.  A thin, lighted tube with a tiny camera on the end (laparoscope) will be put through one of the small incisions. The camera on the laparoscope will send a picture to a computer screen in the operating room. This will give the surgeon a good view inside your abdomen.  Hollow tubes will be put through the other small incisions in your abdomen. The tools that are needed for the procedure will be put through these tubes.  Clamps or staples will be put on both ends of the diseased part of the colon.  The part of the intestine between the clamps or staples will be removed.  If possible, the ends of the healthy colon that remain will be stitched (sutured) or stapled together to allow your body to pass waste (stool).  Sometimes, the remaining colon cannot be stitched back together. If this is the case, a colostomy will be needed. If you need a colostomy: ? An opening to the outside of your body (stoma) will be made through your abdomen. ? The end of your colon will be brought to the opening. It will be stitched to the skin. ? A bag will be attached to the opening. Stool will drain into this removable bag. ? The  colostomy may be temporary or permanent.  The incisions from the colectomy will be closed with sutures or staples. The procedure may vary among health care providers and hospitals. What happens after the procedure?  Your blood pressure, heart rate, breathing rate, and blood oxygen level will be monitored until the medicines you were given have worn off.  You will receive fluids through an IV tube until your bowels start to work properly.  Once your bowels are working again, you will be given clear liquids first and then solid food as tolerated.  You will be given medicines to control your pain and nausea, if needed.  Do not drive for 24 hours if you were given a sedative. This information is not intended to replace advice given to you by your health care provider. Make sure you discuss any questions you have with your health care provider. Document Revised: 06/03/2017 Document Reviewed: 03/22/2016 Elsevier Patient Education  2020 [employer].

## 2020-02-20 NOTE — Progress Notes (Signed)
02/20/2020  History of Present Illness: Carl [name]. is a 49 y.o. 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  He underwent colonoscopy on 8/12 with Dr. Bonna Gains.  He was found to have multiple right colon polyps in the cecum and ascending colon.  He was also found to have a mass at 60 cm from the anus which was biopsied.  Overall 2 polyps found in the cecum and ascending colon showed tubular adenomas but negative for high-grade dysplasia or malignancy; a colon polyp in the ascending colon showed a tubulovillous adenoma which was also negative for high-grade dysplasia or malignancy; and the mass at 6 cm was found to be a villous adenoma without definite high-grade dysplasia identified and given this was only a biopsy sample, could not determine invasion.  Since the colonoscopy, the patient denies any abdominal pain, nausea, vomiting and is having almost daily bowel movements.   Past Medical History: Past Medical History:  Diagnosis Date   Abdominal pain    Constipation    Decreased visual acuity    Nevus    Polyuria    Recurrent boils    scalp behind ear   Schizophrenia (HCC)    Seizures (HCC)    Soft tissue mass    left scalp posterior     Past Surgical History: Past Surgical History:  Procedure Laterality Date   COLONOSCOPY WITH PROPOFOL N/A 02/14/2020   Procedure: COLONOSCOPY WITH PROPOFOL;  Surgeon: Virgel Manifold, MD;  Location: [facility];  Service: Endoscopy;  Laterality: N/A;   MASS EXCISION Left 02/11/2015   Procedure: EXCISION MASS, left post neck;  Surgeon: Florene Glen, MD;  Location: [place];  Service: General;  Laterality: Left;    Home Medications: Prior to Admission medications   Medication Sig Start Date End Date Taking? Authorizing Provider  benztropine (COGENTIN) 1 MG tablet Take 1 mg by mouth at bedtime. 04/16/19  Yes [provider]  docusate  sodium (COLACE) 100 MG capsule Take 1 capsule (100 mg total) by mouth daily. 09/13/17  Yes Pucilowska, Jolanta B, MD  fluticasone (FLONASE) 50 MCG/ACT nasal spray Place 2 sprays into the nose daily. 01/02/16  Yes [provider]  loratadine (CLARITIN) 10 MG tablet Take 1 tablet (10 mg total) by mouth daily. 09/13/17  Yes Pucilowska, Jolanta B, MD  risperiDONE (RISPERDAL) 2 MG tablet Take 1 tablet by mouth daily. 01/30/20  Yes [provider]  traZODone (DESYREL) 100 MG tablet Take 1 tablet (100 mg total) by mouth at bedtime as needed for sleep. 09/12/17  Yes Pucilowska, Jolanta B, MD  bisacodyl (DULCOLAX) 5 MG EC tablet Take all 4 tablets at 8 am the morning prior to your surgery. 02/20/20   Olean Ree, MD  metroNIDAZOLE (FLAGYL) 500 MG tablet Take 2 tabs at 8 am, take 2 tabs at 2 pm, and take 2 tabs at 8pm the day prior to surgery. 02/20/20   Olean Ree, MD  neomycin (MYCIFRADIN) 500 MG tablet Take 2 tablet at 8am, take 2 tablets at 2pm, and take 2 tablets at 8pm the day prior to your surgery 02/20/20   Olean Ree, MD  polyethylene glycol powder (MIRALAX) 17 GM/SCOOP powder Mix full container in 64 ounces of Gatorade or other clear liquid. 02/20/20   Olean Ree, MD    Allergies: No Known Allergies  Review of Systems: Review of Systems  Constitutional: Negative for chills and fever.  Respiratory: Negative for shortness of breath.  Cardiovascular: Negative for chest pain.  Gastrointestinal: Negative for abdominal pain, constipation, diarrhea, nausea and vomiting.    Physical Exam BP 124/82    Pulse (!) 105    Temp 97.9 F (36.6 C)    Resp 12    Wt 175 lb 12.8 oz (79.7 kg)    SpO2 98%    BMI 26.73 kg/m  CONSTITUTIONAL: No acute distress HEENT:  Normocephalic, atraumatic, extraocular motion intact. RESPIRATORY:  Lungs are clear, and breath sounds are equal bilaterally. Normal respiratory effort without pathologic use of accessory muscles. CARDIOVASCULAR: Heart is  regular without murmurs, gallops, or rubs. GI: The abdomen is soft, nondistended, nontender to palpation. NEUROLOGIC:  Motor and sensation is grossly normal.  Cranial nerves are grossly intact. PSYCH:  Alert and oriented to person, place and time. Affect is normal.  Labs/Imaging: Pathology: DIAGNOSIS:  A. COLON POLYP X2, CECUM AND ASCENDING; COLD SNARE:  - TUBULAR ADENOMA (MULTIPLE FRAGMENTS).  - NEGATIVE FOR HIGH-GRADE DYSPLASIA AND MALIGNANCY.   B. COLON POLYP, ASCENDING; HOT SNARE:  - TUBULOVILLOUS ADENOMA (MULTIPLE FRAGMENTS).  - NEGATIVE FOR HIGH-GRADE DYSPLASIA AND MALIGNANCY.   C. COLON MASS, 60 CM; COLD BIOPSY:  - VILLOUS ADENOMA, SEE COMMENT.  - DEFINITE HIGH-GRADE DYSPLASIA IS NOT IDENTIFIED.  - INVASION IS NOT IDENTIFIED IN THIS SUPERFICIAL SAMPLE.  - DEEPER SECTIONS EXAMINED.    Assessment and Plan: This is a 49 y.o. male with history of colocolonic intussusception with multiple polyps and a large mass in his colon from colonoscopy.  -The colonoscopy showed that this mass was found at 60 cm.  With reviewing the patient's CT scan images, I did notice that his cecum was quite without in his transverse colon is also somewhat redundant.  With that in mind, I am uncertain if 60 cm would correspond to the hepatic flexure or not.  I discussed with Dr. Bonna Gains and she mentions that at the time of withdrawal, she has been able to straighten the scope and gotten rid of looping.  However in order to be safe and localized his vascular easily, she has graciously accepted to do repeat colonoscopy so she can tattoo the mass.  Discussed with her that my goal would be to do this procedure laparoscopically and with this, would be more limited to be able to feel the mass' true location.  Unfortunately, doing a colonoscopy intraoperatively prior to surgery, could potentially limit our field of view due to colonic distention with insufflation. -I have discussed with the patient's mother the  plans for repeat colonoscopy with Dr. Bonna Gains followed by surgery with me.  We will tentatively try to schedule his surgery for 03/11/20, but she knows that this may need to be postponed depending on the timing for the colonoscopy. --Otherwise discussed with them the plan for possibly a laparoscopic right colectomy, possibly open surgery.  Discussed with them the risks of bleeding, infection, injury to surrounding structures.  Discussed with them that we will also have our urology colleagues both in the case to do a cystoscopy and injection of ICG into the ureters so that we can more easily identified these structures during surgery.  Discussed with them hospital stay, postoperative recovery, and activity restrictions.  They are willing to proceed.  We will send the prescriptions to his pharmacy follow-up our bowel prep prior to surgery.  He is also aware that he will need to be tested for Covid again prior to surgery.  Face-to-face time spent with the patient and care providers was 25 minutes, with  more than 50% of the time spent counseling, educating, and coordinating care of the patient.     Melvyn Neth, [facility]

## 2020-02-21 ENCOUNTER — Telehealth: Payer: Self-pay | Admitting: Surgery

## 2020-02-21 NOTE — Progress Notes (Signed)
Carl Antigua, MD [address]  [place]  [place], [place] [address]  Main: [phone]  Fax: [phone]   Primary Care Physician: Donnie Coffin, MD  Virtual Visit via Telephone Note  I connected with patient on 02/21/20 at  3:45 PM EDT by telephone and verified that I am speaking with the correct person using two identifiers.   I discussed the limitations, risks, security and privacy concerns of performing an evaluation and management service by telephone and the availability of in person appointments. I also discussed with the patient that there may be a patient responsible charge related to this service. The patient expressed understanding and agreed to proceed.  Location of Patient: Home Location of Provider: Home Persons involved: Patient, his mother Carl [name], and provider only during the visit (nursing staff and front desk staff was involved in communicating with the patient prior to the appointment, reviewing medications and checking them in)   History of Present Illness: Chief Complaint  Patient presents with  . Colonic intussusception      HPI: Carl Mota. is a 49 y.o. male recently found to have a large colon polyp on recent colonoscopy.  Patient was referred to surgery for surgical resection.  However, Dr. Hampton Abbot discussed his plans with me, including the plan for laparoscopic resection, and the concern is that he may not be able to feel the mass during the surgery.  Due to concerns for poor localization without a tattoo, he is requesting tattoo placement.  Current Outpatient Medications  Medication Sig Dispense Refill  . benztropine (COGENTIN) 1 MG tablet Take 1 mg by mouth at bedtime.    . docusate sodium (COLACE) 100 MG capsule Take 1 capsule (100 mg total) by mouth daily. 30 capsule 1  . fluticasone (FLONASE) 50 MCG/ACT nasal spray Place 2 sprays into the nose daily.    Marland Kitchen loratadine (CLARITIN) 10 MG tablet Take 1 tablet (10 mg total) by mouth  daily. 30 tablet 1  . risperiDONE (RISPERDAL) 2 MG tablet Take 1 tablet by mouth daily.    . traZODone (DESYREL) 100 MG tablet Take 1 tablet (100 mg total) by mouth at bedtime as needed for sleep. 30 tablet 1  . bisacodyl (DULCOLAX) 5 MG EC tablet Take all 4 tablets at 8 am the morning prior to your surgery. 4 tablet 0  . metroNIDAZOLE (FLAGYL) 500 MG tablet Take 2 tabs at 8 am, take 2 tabs at 2 pm, and take 2 tabs at 8pm the day prior to surgery. 6 tablet 0  . Na Sulfate-K Sulfate-Mg Sulf 17.5-3.13-1.6 GM/177ML SOLN At 5 PM the day before procedure take 1 bottle and 5 hours before procedure take 1 bottle. 354 mL 0  . neomycin (MYCIFRADIN) 500 MG tablet Take 2 tablet at 8am, take 2 tablets at 2pm, and take 2 tablets at 8pm the day prior to your surgery 6 tablet 0  . polyethylene glycol powder (MIRALAX) 17 GM/SCOOP powder Mix full container in 64 ounces of Gatorade or other clear liquid. 238 g 0   No current facility-administered medications for this visit.    Allergies as of 02/20/2020  . (No Known Allergies)    Review of Systems:    All systems reviewed and negative except where noted in HPI.   Observations/Objective:  Labs: CMP     Component Value Date/Time   NA 141 04/28/2019 1336   K 4.1 04/28/2019 1336   CL 102 04/28/2019 1336   CO2 27 04/28/2019 1336  GLUCOSE 101 (H) 04/28/2019 1336   BUN 15 04/28/2019 1336   CREATININE 1.17 04/28/2019 1336   CALCIUM 9.6 04/28/2019 1336   PROT 8.0 04/28/2019 1336   ALBUMIN 4.5 04/28/2019 1336   AST 19 04/28/2019 1336   ALT 24 04/28/2019 1336   ALKPHOS 36 (L) 04/28/2019 1336   BILITOT 0.5 04/28/2019 1336   GFRNONAA >60 04/28/2019 1336   GFRAA >60 04/28/2019 1336   Lab Results  Component Value Date   WBC 4.6 04/28/2019   HGB 15.0 04/28/2019   HCT 44.2 04/28/2019   MCV 81.9 04/28/2019   PLT 198 04/28/2019    Imaging Studies: No results found.  Assessment and Plan:   Carl Veldhuizen. is a 49 y.o. y/o male here for follow-up  of large colon polyp  Assessment and Plan: Based on the above discussion with Dr. Hampton Abbot, tattoo placement at the base of the polyp was requested by surgery.  This would help localize the area of the large polyp better.  This was discussed with patient and family and they are agreeable to repeat colonoscopy for tattoo placement  We also discussed with patient and mother that although the polyp biopsies do not show malignancy, since this was just a sample biopsy and not the complete polyp, cancer cells could also be present final diagnosis will depend on surgical specimen  I have discussed alternative options, risks & benefits,  which include, but are not limited to, bleeding, infection, perforation,respiratory complication & drug reaction.  The patient agrees with this plan & written consent will be obtained.     Follow Up Instructions:    I discussed the assessment and treatment plan with the patient. The patient was provided an opportunity to ask questions and all were answered. The patient agreed with the plan and demonstrated an understanding of the instructions.   The patient was advised to call back or seek an in-person evaluation if the symptoms worsen or if the condition fails to improve as anticipated.  I provided 12 minutes of non-face-to-face time during this encounter. Additional time was spent in reviewing patient's chart, placing orders etc.   Virgel Manifold, MD  Speech recognition software was used to dictate this note.

## 2020-02-21 NOTE — Telephone Encounter (Signed)
Also did go over bowel prep instructions with patient that he needs to do prior to surgery.  Patient has his sheet with instructions and voices understanding.

## 2020-02-21 NOTE — Telephone Encounter (Signed)
Pt has been advised of Pre-Admission date/time, COVID Testing date and Surgery date.  Surgery Date: 03/11/20 Preadmission Testing Date: 02/29/20 (phone 1p-5p) Covid Testing Date: 03/07/20 - patient advised to go to the [place] ([place] between 8a-1p   Patient has been made aware to call [phone], between 1-3:00pm the day before surgery, to find out what time to arrive for surgery.

## 2020-02-26 ENCOUNTER — Telehealth: Payer: Self-pay | Admitting: Surgery

## 2020-02-26 ENCOUNTER — Other Ambulatory Visit
Admission: RE | Admit: 2020-02-26 | Discharge: 2020-02-26 | Disposition: A | Discharge status: Home / Self Care | Payer: Medicare Other | Source: Ambulatory Visit | Attending: Gastroenterology | Admitting: Gastroenterology

## 2020-02-26 ENCOUNTER — Other Ambulatory Visit: Payer: Self-pay

## 2020-02-26 DIAGNOSIS — Z20822 Contact with and (suspected) exposure to covid-19: Secondary | ICD-10-CM | POA: Diagnosis not present

## 2020-02-26 DIAGNOSIS — Z01812 Encounter for preprocedural laboratory examination: Secondary | ICD-10-CM | POA: Insufficient documentation

## 2020-02-26 NOTE — Telephone Encounter (Signed)
Called mother back. She had questions in regards to bowel prep for patients surgery. All questions answered. Mother voiced understanding and no further concerns at this time.

## 2020-02-26 NOTE — Telephone Encounter (Signed)
Patients mother called and is asking for one of the nurses to give them a call back. Please call patient and advise.

## 2020-02-27 ENCOUNTER — Encounter: Payer: Self-pay | Admitting: Gastroenterology

## 2020-02-27 LAB — SARS CORONAVIRUS 2 (TAT 6-24 HRS): SARS Coronavirus 2: NEGATIVE

## 2020-02-28 ENCOUNTER — Ambulatory Visit: Payer: Medicare Other | Admitting: Anesthesiology

## 2020-02-28 ENCOUNTER — Encounter
Admission: RE | Disposition: A | Discharge status: Home / Self Care | Payer: Self-pay | Source: Home / Self Care | Attending: Gastroenterology

## 2020-02-28 ENCOUNTER — Ambulatory Visit
Admission: RE | Admit: 2020-02-28 | Discharge: 2020-02-28 | Disposition: A | Discharge status: Home / Self Care | Payer: Medicare Other | Attending: Gastroenterology | Admitting: Gastroenterology

## 2020-02-28 ENCOUNTER — Other Ambulatory Visit: Payer: Self-pay

## 2020-02-28 ENCOUNTER — Encounter: Payer: Self-pay | Admitting: Gastroenterology

## 2020-02-28 DIAGNOSIS — D123 Benign neoplasm of transverse colon: Secondary | ICD-10-CM | POA: Insufficient documentation

## 2020-02-28 DIAGNOSIS — G40909 Epilepsy, unspecified, not intractable, without status epilepticus: Secondary | ICD-10-CM | POA: Diagnosis not present

## 2020-02-28 DIAGNOSIS — Z1211 Encounter for screening for malignant neoplasm of colon: Secondary | ICD-10-CM | POA: Diagnosis not present

## 2020-02-28 DIAGNOSIS — Z79899 Other long term (current) drug therapy: Secondary | ICD-10-CM | POA: Insufficient documentation

## 2020-02-28 DIAGNOSIS — F319 Bipolar disorder, unspecified: Secondary | ICD-10-CM | POA: Insufficient documentation

## 2020-02-28 DIAGNOSIS — D125 Benign neoplasm of sigmoid colon: Secondary | ICD-10-CM | POA: Diagnosis not present

## 2020-02-28 DIAGNOSIS — Z86010 Personal history of colonic polyps: Secondary | ICD-10-CM | POA: Insufficient documentation

## 2020-02-28 DIAGNOSIS — K635 Polyp of colon: Secondary | ICD-10-CM | POA: Diagnosis not present

## 2020-02-28 DIAGNOSIS — F209 Schizophrenia, unspecified: Secondary | ICD-10-CM | POA: Insufficient documentation

## 2020-02-28 DIAGNOSIS — K561 Intussusception: Secondary | ICD-10-CM

## 2020-02-28 HISTORY — PX: COLONOSCOPY WITH PROPOFOL: SHX5780

## 2020-02-28 SURGERY — COLONOSCOPY WITH PROPOFOL
Anesthesia: General

## 2020-02-28 MED ORDER — SODIUM CHLORIDE 0.9 % IV SOLN
INTRAVENOUS | Status: DC
  Administered 2020-02-28: 1000 mL via INTRAVENOUS

## 2020-02-28 MED ORDER — LIDOCAINE HCL (CARDIAC) PF 100 MG/5ML IV SOSY
PREFILLED_SYRINGE | INTRAVENOUS | Status: DC | PRN
  Administered 2020-02-28: 40 mg via INTRAVENOUS

## 2020-02-28 MED ORDER — MIDAZOLAM HCL 2 MG/2ML IJ SOLN
INTRAMUSCULAR | Status: DC | PRN
  Administered 2020-02-28: 2 mg via INTRAVENOUS

## 2020-02-28 MED ORDER — SPOT INK MARKER SYRINGE KIT
PACK | SUBMUCOSAL | Status: DC | PRN
  Administered 2020-02-28: 5 mL via SUBMUCOSAL

## 2020-02-28 MED ORDER — PROPOFOL 500 MG/50ML IV EMUL
INTRAVENOUS | Status: AC
  Filled 2020-02-28: qty 50

## 2020-02-28 MED ORDER — PROPOFOL 10 MG/ML IV BOLUS
INTRAVENOUS | Status: DC | PRN
  Administered 2020-02-28: 90 mg via INTRAVENOUS

## 2020-02-28 MED ORDER — MIDAZOLAM HCL 2 MG/2ML IJ SOLN
INTRAMUSCULAR | Status: AC
  Filled 2020-02-28: qty 2

## 2020-02-28 MED ORDER — PROPOFOL 500 MG/50ML IV EMUL
INTRAVENOUS | Status: DC | PRN
  Administered 2020-02-28: 150 ug/kg/min via INTRAVENOUS

## 2020-02-28 MED ORDER — SODIUM CHLORIDE 0.9 % IV SOLN: 2.0000 g | INTRAVENOUS | Status: DC

## 2020-02-28 MED ORDER — INDOCYANINE GREEN 25 MG IV SOLR: 25.0000 mg | INTRAVENOUS | Status: DC

## 2020-02-28 NOTE — Op Note (Signed)
[facility] Gastroenterology Patient Name: Fox Salminen Procedure Date: 02/28/2020 11:11 AM MRN: [id number] Account #: [id number] Date of Birth: 03-01-1971 Admit Type: Outpatient Age: 49 Room: [facility] ENDO ROOM 1 Gender: Male Note Status: Finalized Procedure:             Colonoscopy Indications:           High risk colon cancer surveillance: Personal history                         of colonic polyps Providers:             Mancil Pfenning B. Bonna Gains MD, MD Referring MD:          Forest Gleason Md, MD (Referring MD) Medicines:             Monitored Anesthesia Care Complications:         No immediate complications. Procedure:             Pre-Anesthesia Assessment:                        - ASA Grade Assessment: II - A patient with mild                         systemic disease.                        - Prior to the procedure, a History and Physical was                         performed, and patient medications, allergies and                         sensitivities were reviewed. The patient's tolerance                         of previous anesthesia was reviewed.                        - The risks and benefits of the procedure and the                         sedation options and risks were discussed with the                         patient. All questions were answered and informed                         consent was obtained.                        - Patient identification and proposed procedure were                         verified prior to the procedure by the physician, the                         nurse, the anesthesiologist, the anesthetist and the                         technician. The procedure  was verified in the                         procedure room.                        After obtaining informed consent, the colonoscope was                         passed under direct vision. Throughout the procedure,                         the patient's blood pressure, pulse, and oxygen                          saturations were monitored continuously. The                         Colonoscope was introduced through the anus and                         advanced to the the cecum, identified by appendiceal                         orifice and ileocecal valve. The colonoscopy was                         performed with ease. The patient tolerated the                         procedure well. The quality of the bowel preparation                         was good. Findings:      The perianal and digital rectal examinations were normal.      A large polyp was found in the hepatic flexure. The polyp was       multi-lobulated. Area was tattooed with an injection of 5 mL of Spot       (carbon black).      A 5 mm polyp was found in the sigmoid colon. The polyp was sessile. The       polyp was removed with a cold snare. Resection and retrieval were       complete.      The exam was otherwise without abnormality.      The rectum, sigmoid colon, descending colon, transverse colon, ascending       colon and cecum appeared normal.      The retroflexed view of the distal rectum and anal verge was normal and       showed no anal or rectal abnormalities. Impression:            - One large polyp at the hepatic flexure. Tattooed.                        - One 5 mm polyp in the sigmoid colon, removed with a                         cold snare. Resected and retrieved.                        -  The examination was otherwise normal.                        - The rectum, sigmoid colon, descending colon,                         transverse colon, ascending colon and cecum are normal.                        - The distal rectum and anal verge are normal on                         retroflexion view. Recommendation:        - Await pathology results.                        - Discharge patient to home (with escort).                        - Advance diet as tolerated.                        - Continue present  medications.                        - Repeat colonoscopy date to be determined after                         pending pathology results are reviewed.                        - The findings and recommendations were discussed with                         the patient.                        - The findings and recommendations were discussed with                         the patient's family.                        - Return to primary care physician as previously                         scheduled. Procedure Code(s):     --- Professional ---                        [phone], Colonoscopy, flexible; with removal of                         tumor(s), polyp(s), or other lesion(s) by snare                         technique                        [id number], Colonoscopy, flexible; with directed submucosal  injection(s), any substance Diagnosis Code(s):     --- Professional ---                        Z86.010, Personal history of colonic polyps                        K63.5, Polyp of colon CPT copyright 2019 [employer]. All rights reserved. The codes documented in this report are preliminary and upon coder review may  be revised to meet current compliance requirements.  [name], MD Margretta Sidle B. Bonna Gains MD, MD 02/28/2020 11:56:17 AM This report has been signed electronically. Number of Addenda: 0 Note Initiated On: 02/28/2020 11:11 AM Scope Withdrawal Time: 0 hours 11 minutes 15 seconds  Total Procedure Duration: 0 hours 24 minutes 17 seconds  Estimated Blood Loss:  Estimated blood loss: none.      [facility]

## 2020-02-28 NOTE — H&P (Signed)
[name], MD [address], [place], [place], [place], [address] [address], [place], [place], [place], [address] Phone: [phone]  Fax: [phone]  Primary Care Physician:  Donnie Coffin, MD   Pre-Procedure History & Physical: HPI:  Carl Gell. is a 49 y.o. male is here for a colonoscopy.   Past Medical History:  Diagnosis Date  . Abdominal pain   . Constipation   . Decreased visual acuity   . Nevus   . Polyuria   . Recurrent boils    scalp behind ear  . Schizophrenia ([place])   . Seizures ([place])   . Soft tissue mass    left scalp posterior    Past Surgical History:  Procedure Laterality Date  . COLONOSCOPY WITH PROPOFOL N/A 02/14/2020   Procedure: COLONOSCOPY WITH PROPOFOL;  Surgeon: Virgel Manifold, MD;  Location: [facility] ENDOSCOPY;  Service: Endoscopy;  Laterality: N/A;  . MASS EXCISION Left 02/11/2015   Procedure: EXCISION MASS, left post neck;  Surgeon: Florene Glen, MD;  Location: [place];  Service: General;  Laterality: Left;    Prior to Admission medications   Medication Sig Start Date End Date Taking? Authorizing Provider  benztropine (COGENTIN) 1 MG tablet Take 1 mg by mouth daily.  04/16/19  Yes [provider]  bisacodyl (DULCOLAX) 5 MG EC tablet Take all 4 tablets at 8 am the morning prior to your surgery. 02/20/20  Yes Piscoya, Jacqulyn Bath, MD  cetirizine (ZYRTEC) 10 MG tablet Take 10 mg by mouth daily as needed for allergies.   Yes [provider]  docusate sodium (COLACE) 100 MG capsule Take 1 capsule (100 mg total) by mouth daily. Patient taking differently: Take 100 mg by mouth at bedtime.  09/13/17  Yes Pucilowska, Jolanta B, MD  metroNIDAZOLE (FLAGYL) 500 MG tablet Take 2 tabs at 8 am, take 2 tabs at 2 pm, and take 2 tabs at 8pm the day prior to surgery. 02/20/20  Yes Piscoya, Jose, MD  Na Sulfate-K Sulfate-Mg Sulf 17.5-3.13-1.6 GM/177ML SOLN At 5 PM the day before procedure take 1 bottle and 5 hours before  procedure take 1 bottle. 02/20/20  Yes Virgel Manifold, MD  neomycin (MYCIFRADIN) 500 MG tablet Take 2 tablet at 8am, take 2 tablets at 2pm, and take 2 tablets at 8pm the day prior to your surgery 02/20/20  Yes Piscoya, Jose, MD  risperiDONE (RISPERDAL) 2 MG tablet Take 2 mg by mouth in the morning, at noon, and at bedtime.  01/30/20  Yes [provider]  traZODone (DESYREL) 100 MG tablet Take 1 tablet (100 mg total) by mouth at bedtime as needed for sleep. Patient taking differently: Take 100 mg by mouth at bedtime.  09/12/17  Yes Pucilowska, Jolanta B, MD  fluticasone (FLONASE) 50 MCG/ACT nasal spray Place 2 sprays into the nose daily as needed for allergies.  Patient not taking: Reported on 02/28/2020 01/02/16   [provider]  polyethylene glycol powder (MIRALAX) 17 GM/SCOOP powder Mix full container in 64 ounces of Gatorade or other clear liquid. Patient not taking: Reported on 02/28/2020 02/20/20   Olean Ree, MD    Allergies as of 02/21/2020  . (No Known Allergies)    Family History  Problem Relation Age of Onset  . Hyperlipidemia Mother   . Hypertension Father   . Diabetes Father     Social History   Socioeconomic History  . Marital status: Single    Spouse name: Not on file  . Number of  children: Not on file  . Years of education: Not on file  . Highest education level: Not on file  Occupational History  . Not on file  Tobacco Use  . Smoking status: Never Smoker  . Smokeless tobacco: Never Used  Vaping Use  . Vaping Use: Never used  Substance and Sexual Activity  . Alcohol use: No    Alcohol/week: 0.0 standard drinks  . Drug use: No  . Sexual activity: Not on file  Other Topics Concern  . Not on file  Social History Narrative  . Not on file   Social Determinants of Health   Financial Resource Strain:   . Difficulty of Paying Living Expenses: Not on file  Food Insecurity:   . Worried About Charity fundraiser in the Last Year: Not on file   . Ran Out of Food in the Last Year: Not on file  Transportation Needs:   . Lack of Transportation (Medical): Not on file  . Lack of Transportation (Non-Medical): Not on file  Physical Activity:   . Days of Exercise per Week: Not on file  . Minutes of Exercise per Session: Not on file  Stress:   . Feeling of Stress : Not on file  Social Connections:   . Frequency of Communication with Friends and Family: Not on file  . Frequency of Social Gatherings with Friends and Family: Not on file  . Attends Religious Services: Not on file  . Active Member of Clubs or Organizations: Not on file  . Attends Archivist Meetings: Not on file  . Marital Status: Not on file  Intimate Partner Violence:   . Fear of Current or Ex-Partner: Not on file  . Emotionally Abused: Not on file  . Physically Abused: Not on file  . Sexually Abused: Not on file    Review of Systems: See HPI, otherwise negative ROS  Physical Exam: BP 117/90   Pulse 83   Temp (!) 97.3 F (36.3 C) (Temporal)   Resp 18   Ht 5\' 8"  (1.727 m)   Wt 79.5 kg   SpO2 100%   BMI 26.66 kg/m  General:   Alert,  pleasant and cooperative in NAD Head:  Normocephalic and atraumatic. Neck:  Supple; no masses or thyromegaly. Lungs:  Clear throughout to auscultation, normal respiratory effort.    Heart:  +S1, +S2, Regular rate and rhythm, No edema. Abdomen:  Soft, nontender and nondistended. Normal bowel sounds, without guarding, and without rebound.   Neurologic:  Alert and  oriented x4;  grossly normal neurologically.  Impression/Plan: Carl Matte. is here for a colonoscopy to be performed for history of polyps.  Risks, benefits, limitations, and alternatives regarding  colonoscopy have been reviewed with the patient.  Questions have been answered.  All parties agreeable.   Virgel Manifold, MD  02/28/2020, 11:10 AM

## 2020-02-28 NOTE — Anesthesia Preprocedure Evaluation (Signed)
Anesthesia Evaluation  Patient identified by MRN, date of birth, ID band Patient awake    Reviewed: Allergy & Precautions, NPO status , Patient's Chart, lab work & pertinent test results  History of Anesthesia Complications Negative for: history of anesthetic complications  Airway Mallampati: II       Dental   Pulmonary neg sleep apnea, neg COPD, Not current smoker,           Cardiovascular (-) hypertension(-) Past MI and (-) CHF (-) dysrhythmias (-) Valvular Problems/Murmurs     Neuro/Psych Seizures - (last several years ago), Well Controlled,  Bipolar Disorder Schizophrenia    GI/Hepatic Neg liver ROS, neg GERD  ,  Endo/Other  neg diabetes  Renal/GU negative Renal ROS     Musculoskeletal   Abdominal   Peds  Hematology   Anesthesia Other Findings   Reproductive/Obstetrics                             Anesthesia Physical  Anesthesia Plan  ASA: III  Anesthesia Plan: General   Post-op Pain Management:    Induction: Intravenous  PONV Risk Score and Plan: 2 and Propofol infusion and TIVA  Airway Management Planned: Nasal Cannula  Additional Equipment:   Intra-op Plan:   Post-operative Plan:   Informed Consent: I have reviewed the patients History and Physical, chart, labs and discussed the procedure including the risks, benefits and alternatives for the proposed anesthesia with the patient or authorized representative who has indicated his/her understanding and acceptance.       Plan Discussed with:   Anesthesia Plan Comments:         Anesthesia Quick Evaluation

## 2020-02-28 NOTE — Anesthesia Procedure Notes (Signed)
Date/Time: 02/28/2020 11:20 AM Performed by: Doreen Salvage, CRNA Pre-anesthesia Checklist: Patient identified, Emergency Drugs available, Suction available and Patient being monitored Patient Re-evaluated:Patient Re-evaluated prior to induction Oxygen Delivery Method: Nasal cannula Induction Type: IV induction Dental Injury: Teeth and Oropharynx as per pre-operative assessment  Comments: Nasal cannula with etCO2 monitoring

## 2020-02-28 NOTE — Transfer of Care (Signed)
Immediate Anesthesia Transfer of Care Note  Patient: Fontaine Hehl.  Procedure(s) Performed: Procedure(s): COLONOSCOPY WITH PROPOFOL (N/A)  Patient Location: PACU and Endoscopy Unit  Anesthesia Type:General  Level of Consciousness: sedated  Airway & Oxygen Therapy: Patient Spontanous Breathing and Patient connected to nasal cannula oxygen  Post-op Assessment: Report given to RN and Post -op Vital signs reviewed and stable  Post vital signs: Reviewed and stable  Last Vitals:  Vitals:   02/28/20 1016 02/28/20 1147  BP: 117/90 103/82  Pulse: 83 85  Resp: 18 13  Temp: (!) 36.3 C (!) 36.2 C  SpO2: 606% 00%    Complications: No apparent anesthesia complications

## 2020-02-28 NOTE — Anesthesia Postprocedure Evaluation (Signed)
Anesthesia Post Note  Patient: Carl [name].  Procedure(s) Performed: COLONOSCOPY WITH PROPOFOL (N/A )  Patient location during evaluation: Endoscopy Anesthesia Type: General Level of consciousness: awake and alert and oriented Pain management: pain level controlled Vital Signs Assessment: post-procedure vital signs reviewed and stable Respiratory status: spontaneous breathing Cardiovascular status: blood pressure returned to baseline Anesthetic complications: no   No complications documented.   Last Vitals:  Vitals:   02/28/20 1157 02/28/20 1207  BP: 104/87 113/77  Pulse: 76 73  Resp: (!) 22 17  Temp:    SpO2: 96% 100%    Last Pain:  Vitals:   02/28/20 1207  TempSrc:   PainSc: 0-No pain                 Elisavet Buehrer

## 2020-02-29 ENCOUNTER — Other Ambulatory Visit: Payer: Self-pay

## 2020-02-29 ENCOUNTER — Encounter
Admission: RE | Admit: 2020-02-29 | Discharge: 2020-02-29 | Disposition: A | Discharge status: Home / Self Care | Payer: Medicare Other | Source: Ambulatory Visit | Attending: Surgery | Admitting: Surgery

## 2020-02-29 ENCOUNTER — Encounter: Payer: Self-pay | Admitting: Gastroenterology

## 2020-02-29 LAB — SURGICAL PATHOLOGY

## 2020-02-29 NOTE — Patient Instructions (Signed)
Your procedure is scheduled on: 03/11/20 Report to [place]. To find out your arrival time please call [phone] between 1PM - 3PM on 03/07/20.  Remember: Instructions that are not followed completely may result in serious medical risk, up to and including death, or upon the discretion of your surgeon and anesthesiologist your surgery may need to be rescheduled.     _X__ 1. DIET ACCORDING TO DR PISCOYA'S INSTRUCTIONS.  __X__2.  On the morning of surgery brush your teeth with toothpaste and water, you                 may rinse your mouth with mouthwash if you wish.  Do not swallow any              toothpaste of mouthwash.     _X__ 3.  No Alcohol for 24 hours before or after surgery.   _X__ 4.  Do Not Smoke or use e-cigarettes For 24 Hours Prior to Your Surgery.                 Do not use any chewable tobacco products for at least 6 hours prior to                 surgery.  ____  5.  Bring all medications with you on the day of surgery if instructed.   __X__  6.  Notify your doctor if there is any change in your medical condition      (cold, fever, infections).     Do not wear jewelry, make-up, hairpins, clips or nail polish. Do not wear lotions, powders, or perfumes.  Do not shave 48 hours prior to surgery. Men may shave face and neck. Do not bring valuables to the hospital.    [facility] is not responsible for any belongings or valuables.  Contacts, dentures/partials or body piercings may not be worn into surgery. Bring a case for your contacts, glasses or hearing aids, a denture cup will be supplied. Leave your suitcase in the car. After surgery it may be brought to your room. For patients admitted to the hospital, discharge time is determined by your treatment team.   Patients discharged the day of surgery will not be allowed to drive home.   Please read over the following fact sheets that you were given:   MRSA  Information  __X__ Take these medicines the morning of surgery with A SIP OF WATER:    1. none  2.   3.   4.  5.  6.  ____ Fleet Enema (as directed)   __X__ Use CHG Soap/SAGE wipes as directed  ____ Use inhalers on the day of surgery  ____ Stop metformin/Janumet/Farxiga 2 days prior to surgery    ____ Take 1/2 of usual insulin dose the night before surgery. No insulin the morning          of surgery.   ____ Stop Blood Thinners Coumadin/Plavix/Xarelto/Pleta/Pradaxa/Eliquis/Effient/Aspirin  on   Or contact your Surgeon, Cardiologist or Medical Doctor regarding  ability to stop your blood thinners  __X__ Stop Anti-inflammatories 7 days before surgery such as Advil, Ibuprofen, Motrin,  BC or Goodies Powder, Naprosyn, Naproxen, Aleve, Aspirin    __X__ Stop all herbal supplements, fish oil or vitamin E until after surgery.    ____ Bring C-Pap to the hospital.     How to Use Chlorhexidine for Bathing Chlorhexidine gluconate (CHG) is a germ-killing (antiseptic) solution that is  used to clean the skin. It can get rid of the bacteria that normally live on the skin and can keep them away for about 24 hours. To clean your skin with CHG, you may be given:  A CHG solution to use in the shower or as part of a sponge bath.  A prepackaged cloth that contains CHG. Cleaning your skin with CHG may help lower the risk for infection:  While you are staying in the intensive care unit of the hospital.  If you have a vascular access, such as a central line, to provide short-term or long-term access to your veins.  If you have a catheter to drain urine from your bladder.  If you are on a ventilator. A ventilator is a machine that helps you breathe by moving air in and out of your lungs.  After surgery. What are the risks? Risks of using CHG include:  A skin reaction.  Hearing loss, if CHG gets in your ears.  Eye injury, if CHG gets in your eyes and is not rinsed out.  The CHG product  catching fire. Make sure that you avoid smoking and flames after applying CHG to your skin. Do not use CHG:  If you have a chlorhexidine allergy or have previously reacted to chlorhexidine.  On babies younger than 22 months of age. How to use CHG solution  Use CHG only as told by your health care provider, and follow the instructions on the label.  Use the full amount of CHG as directed. Usually, this is one bottle. During a shower Follow these steps when using CHG solution during a shower (unless your health care provider gives you different instructions): 1. Start the shower. 2. Use your normal soap and shampoo to wash your face and hair. 3. Turn off the shower or move out of the shower stream. 4. Pour the CHG onto a clean washcloth. Do not use any type of brush or rough-edged sponge. 5. Starting at your neck, lather your body down to your toes. Make sure you follow these instructions: ? If you will be having surgery, pay special attention to the part of your body where you will be having surgery. Scrub this area for at least 1 minute. ? Do not use CHG on your head or face. If the solution gets into your ears or eyes, rinse them well with water. ? Avoid your genital area. ? Avoid any areas of skin that have broken skin, cuts, or scrapes. ? Scrub your back and under your arms. Make sure to wash skin folds. 6. Let the lather sit on your skin for 1-2 minutes or as long as told by your health care provider. 7. Thoroughly rinse your entire body in the shower. Make sure that all body creases and crevices are rinsed well. 8. Dry off with a clean towel. Do not put any substances on your body afterward--such as powder, lotion, or perfume--unless you are told to do so by your health care provider. Only use lotions that are recommended by the manufacturer. 9. Put on clean clothes or pajamas. 10. If it is the night before your surgery, sleep in clean sheets. 11. REPEAT THE MORNING OF SURGERY

## 2020-03-03 ENCOUNTER — Encounter: Payer: Self-pay | Admitting: Gastroenterology

## 2020-03-07 ENCOUNTER — Other Ambulatory Visit: Payer: Self-pay

## 2020-03-07 ENCOUNTER — Other Ambulatory Visit
Admission: RE | Admit: 2020-03-07 | Discharge: 2020-03-07 | Disposition: A | Discharge status: Home / Self Care | Payer: Medicare Other | Source: Ambulatory Visit | Attending: Surgery | Admitting: Surgery

## 2020-03-07 DIAGNOSIS — Z20822 Contact with and (suspected) exposure to covid-19: Secondary | ICD-10-CM | POA: Diagnosis not present

## 2020-03-07 DIAGNOSIS — Z01812 Encounter for preprocedural laboratory examination: Secondary | ICD-10-CM | POA: Insufficient documentation

## 2020-03-07 LAB — CBC WITH DIFFERENTIAL/PLATELET
Abs Immature Granulocytes: 0 10*3/uL (ref 0.00–0.07)
Basophils Absolute: 0 10*3/uL (ref 0.0–0.1)
Basophils Relative: 1 %
Eosinophils Absolute: 0.1 10*3/uL (ref 0.0–0.5)
Eosinophils Relative: 1 %
HCT: 43.7 % (ref 39.0–52.0)
Hemoglobin: 14.9 g/dL (ref 13.0–17.0)
Immature Granulocytes: 0 %
Lymphocytes Relative: 43 %
Lymphs Abs: 2.1 10*3/uL (ref 0.7–4.0)
MCH: 27.1 pg (ref 26.0–34.0)
MCHC: 34.1 g/dL (ref 30.0–36.0)
MCV: 79.6 fL — ABNORMAL LOW (ref 80.0–100.0)
Monocytes Absolute: 0.5 10*3/uL (ref 0.1–1.0)
Monocytes Relative: 10 %
Neutro Abs: 2.2 10*3/uL (ref 1.7–7.7)
Neutrophils Relative %: 45 %
Platelets: 289 10*3/uL (ref 150–400)
RBC: 5.49 MIL/uL (ref 4.22–5.81)
RDW: 12.8 % (ref 11.5–15.5)
WBC: 4.9 10*3/uL (ref 4.0–10.5)
nRBC: 0 % (ref 0.0–0.2)

## 2020-03-07 LAB — COMPREHENSIVE METABOLIC PANEL
ALT: 29 U/L (ref 0–44)
AST: 21 U/L (ref 15–41)
Albumin: 4.2 g/dL (ref 3.5–5.0)
Alkaline Phosphatase: 41 U/L (ref 38–126)
Anion gap: 6 (ref 5–15)
BUN: 16 mg/dL (ref 6–20)
CO2: 31 mmol/L (ref 22–32)
Calcium: 9.7 mg/dL (ref 8.9–10.3)
Chloride: 101 mmol/L (ref 98–111)
Creatinine, Ser: 1.28 mg/dL — ABNORMAL HIGH (ref 0.61–1.24)
GFR calc Af Amer: >60 mL/min (ref >60)
GFR calc non Af Amer: >60 mL/min (ref >60)
Glucose, Bld: 93 mg/dL (ref 70–99)
Potassium: 4.1 mmol/L (ref 3.5–5.1)
Sodium: 138 mmol/L (ref 135–145)
Total Bilirubin: 0.8 mg/dL (ref 0.3–1.2)
Total Protein: 7.4 g/dL (ref 6.5–8.1)

## 2020-03-07 LAB — SARS CORONAVIRUS 2 (TAT 6-24 HRS): SARS Coronavirus 2: NEGATIVE

## 2020-03-08 LAB — CEA: CEA: 2.1 ng/mL (ref 0.0–4.7)

## 2020-03-10 MED ORDER — SODIUM CHLORIDE 0.9 % IV SOLN
2.0000 g | INTRAVENOUS | Status: AC
  Administered 2020-03-11: 2 g via INTRAVENOUS

## 2020-03-10 MED ORDER — INDOCYANINE GREEN 25 MG IV SOLR
25.0000 mg | INTRAVENOUS | Status: DC
  Filled 2020-03-10: qty 10

## 2020-03-11 ENCOUNTER — Inpatient Hospital Stay: Payer: Medicare Other | Admitting: Certified Registered Nurse Anesthetist

## 2020-03-11 ENCOUNTER — Encounter: Payer: Self-pay | Admitting: Surgery

## 2020-03-11 ENCOUNTER — Encounter
Admission: RE | Disposition: A | Discharge status: Home / Self Care | Payer: Self-pay | Source: Home / Self Care | Attending: Surgery

## 2020-03-11 ENCOUNTER — Other Ambulatory Visit: Payer: Self-pay

## 2020-03-11 ENCOUNTER — Inpatient Hospital Stay
Admission: RE | Admit: 2020-03-11 | Discharge: 2020-03-14 | DRG: 330 | Disposition: A | Discharge status: Home / Self Care | Payer: Medicare Other | Attending: Surgery | Admitting: Surgery

## 2020-03-11 DIAGNOSIS — Z83438 Family history of other disorder of lipoprotein metabolism and other lipidemia: Secondary | ICD-10-CM | POA: Diagnosis not present

## 2020-03-11 DIAGNOSIS — K561 Intussusception: Secondary | ICD-10-CM

## 2020-03-11 DIAGNOSIS — Z408 Encounter for other prophylactic surgery: Secondary | ICD-10-CM

## 2020-03-11 DIAGNOSIS — K6389 Other specified diseases of intestine: Secondary | ICD-10-CM

## 2020-03-11 DIAGNOSIS — K429 Umbilical hernia without obstruction or gangrene: Secondary | ICD-10-CM

## 2020-03-11 DIAGNOSIS — K5903 Drug induced constipation: Secondary | ICD-10-CM | POA: Diagnosis present

## 2020-03-11 DIAGNOSIS — F209 Schizophrenia, unspecified: Secondary | ICD-10-CM | POA: Diagnosis present

## 2020-03-11 DIAGNOSIS — C19 Malignant neoplasm of rectosigmoid junction: Principal | ICD-10-CM | POA: Diagnosis present

## 2020-03-11 DIAGNOSIS — Z833 Family history of diabetes mellitus: Secondary | ICD-10-CM

## 2020-03-11 DIAGNOSIS — K567 Ileus, unspecified: Secondary | ICD-10-CM

## 2020-03-11 DIAGNOSIS — C182 Malignant neoplasm of ascending colon: Secondary | ICD-10-CM | POA: Diagnosis not present

## 2020-03-11 DIAGNOSIS — Z8249 Family history of ischemic heart disease and other diseases of the circulatory system: Secondary | ICD-10-CM | POA: Diagnosis not present

## 2020-03-11 DIAGNOSIS — Z79899 Other long term (current) drug therapy: Secondary | ICD-10-CM | POA: Diagnosis not present

## 2020-03-11 HISTORY — PX: LAPAROSCOPIC RIGHT COLECTOMY: SHX5925

## 2020-03-11 SURGERY — COLECTOMY, RIGHT, LAPAROSCOPIC
Anesthesia: General | Laterality: Right

## 2020-03-11 MED ORDER — CHLORHEXIDINE GLUCONATE 0.12 % MT SOLN
OROMUCOSAL | Status: AC
  Administered 2020-03-11: 15 mL via OROMUCOSAL
  Filled 2020-03-11: qty 15

## 2020-03-11 MED ORDER — BUPIVACAINE LIPOSOME 1.3 % IJ SUSP
INTRAMUSCULAR | Status: DC | PRN
  Administered 2020-03-11: 20 mL

## 2020-03-11 MED ORDER — SODIUM CHLORIDE 0.9 % IV SOLN
INTRAVENOUS | Status: DC | PRN
  Administered 2020-03-11: 50 ug/min via INTRAVENOUS

## 2020-03-11 MED ORDER — ACETAMINOPHEN 500 MG PO TABS
1000.0000 mg | ORAL_TABLET | Freq: Four times a day (QID) | ORAL | Status: DC
  Administered 2020-03-11 – 2020-03-14 (×9): 1000 mg via ORAL
  Filled 2020-03-11 (×9): qty 2

## 2020-03-11 MED ORDER — PROMETHAZINE HCL 25 MG/ML IJ SOLN: 6.2500 mg | INTRAMUSCULAR | Status: DC | PRN

## 2020-03-11 MED ORDER — SODIUM CHLORIDE FLUSH 0.9 % IV SOLN
INTRAVENOUS | Status: AC
  Filled 2020-03-11: qty 10

## 2020-03-11 MED ORDER — ONDANSETRON HCL 4 MG/2ML IJ SOLN: 4.0000 mg | Freq: Four times a day (QID) | INTRAMUSCULAR | Status: DC | PRN

## 2020-03-11 MED ORDER — BUPIVACAINE-EPINEPHRINE (PF) 0.5% -1:200000 IJ SOLN
INTRAMUSCULAR | Status: DC | PRN
  Administered 2020-03-11: 30 mL

## 2020-03-11 MED ORDER — BUPIVACAINE LIPOSOME 1.3 % IJ SUSP: 20.0000 mL | Freq: Once | INTRAMUSCULAR | Status: DC

## 2020-03-11 MED ORDER — CHLORHEXIDINE GLUCONATE CLOTH 2 % EX PADS: 6.0000 | MEDICATED_PAD | Freq: Once | CUTANEOUS | Status: DC

## 2020-03-11 MED ORDER — FENTANYL CITRATE (PF) 100 MCG/2ML IJ SOLN: 25.0000 ug | INTRAMUSCULAR | Status: DC | PRN

## 2020-03-11 MED ORDER — ACETAMINOPHEN 500 MG PO TABS: 1000.0000 mg | ORAL_TABLET | ORAL | Status: AC

## 2020-03-11 MED ORDER — CHLORHEXIDINE GLUCONATE CLOTH 2 % EX PADS
6.0000 | MEDICATED_PAD | Freq: Every day | CUTANEOUS | Status: DC
  Administered 2020-03-12: 6 via TOPICAL

## 2020-03-11 MED ORDER — OXYCODONE HCL 5 MG/5ML PO SOLN: 5.0000 mg | Freq: Once | ORAL | Status: DC | PRN

## 2020-03-11 MED ORDER — ALBUMIN HUMAN 5 % IV SOLN: INTRAVENOUS | Status: DC | PRN

## 2020-03-11 MED ORDER — GLYCOPYRROLATE 0.2 MG/ML IJ SOLN
INTRAMUSCULAR | Status: DC | PRN
  Administered 2020-03-11: .2 mg via INTRAVENOUS

## 2020-03-11 MED ORDER — CHLORHEXIDINE GLUCONATE 0.12 % MT SOLN: 15.0000 mL | Freq: Once | OROMUCOSAL | Status: AC

## 2020-03-11 MED ORDER — KETAMINE HCL 50 MG/ML IJ SOLN
INTRAMUSCULAR | Status: AC
  Filled 2020-03-11: qty 10

## 2020-03-11 MED ORDER — POLYETHYLENE GLYCOL 3350 17 G PO PACK: 17.0000 g | PACK | Freq: Every day | ORAL | Status: DC | PRN

## 2020-03-11 MED ORDER — HYDROMORPHONE HCL 1 MG/ML IJ SOLN
INTRAMUSCULAR | Status: DC | PRN
  Administered 2020-03-11: .4 mg via INTRAVENOUS
  Administered 2020-03-11: .2 mg via INTRAVENOUS
  Administered 2020-03-11: .4 mg via INTRAVENOUS

## 2020-03-11 MED ORDER — HYDROMORPHONE HCL 1 MG/ML IJ SOLN: 0.5000 mg | INTRAMUSCULAR | Status: DC | PRN

## 2020-03-11 MED ORDER — PHENYLEPHRINE HCL (PRESSORS) 10 MG/ML IV SOLN
INTRAVENOUS | Status: DC | PRN
  Administered 2020-03-11 (×5): 100 ug via INTRAVENOUS

## 2020-03-11 MED ORDER — QUETIAPINE FUMARATE 100 MG PO TABS
100.0000 mg | ORAL_TABLET | Freq: Every day | ORAL | Status: DC
  Administered 2020-03-11 – 2020-03-13 (×3): 100 mg via ORAL
  Filled 2020-03-11: qty 4
  Filled 2020-03-11 (×4): qty 1

## 2020-03-11 MED ORDER — HYDROMORPHONE HCL 1 MG/ML IJ SOLN
INTRAMUSCULAR | Status: AC
Start: 2020-03-11 — End: ?
  Filled 2020-03-11: qty 1

## 2020-03-11 MED ORDER — OXYCODONE HCL 5 MG PO TABS: 5.0000 mg | ORAL_TABLET | Freq: Once | ORAL | Status: DC | PRN

## 2020-03-11 MED ORDER — GABAPENTIN 300 MG PO CAPS
ORAL_CAPSULE | ORAL | Status: AC
  Administered 2020-03-11: 300 mg via ORAL
  Filled 2020-03-11: qty 1

## 2020-03-11 MED ORDER — KETOROLAC TROMETHAMINE 30 MG/ML IJ SOLN
30.0000 mg | Freq: Four times a day (QID) | INTRAMUSCULAR | Status: DC
  Administered 2020-03-11 – 2020-03-14 (×10): 30 mg via INTRAVENOUS
  Filled 2020-03-11 (×10): qty 1

## 2020-03-11 MED ORDER — SODIUM CHLORIDE (PF) 0.9 % IJ SOLN
INTRAMUSCULAR | Status: DC | PRN
  Administered 2020-03-11: 10 mL

## 2020-03-11 MED ORDER — ALVIMOPAN 12 MG PO CAPS
ORAL_CAPSULE | ORAL | Status: AC
  Administered 2020-03-11: 12 mg via ORAL
  Filled 2020-03-11: qty 1

## 2020-03-11 MED ORDER — PANTOPRAZOLE SODIUM 40 MG IV SOLR
40.0000 mg | Freq: Every day | INTRAVENOUS | Status: DC
  Administered 2020-03-11 – 2020-03-13 (×3): 40 mg via INTRAVENOUS
  Filled 2020-03-11 (×3): qty 40

## 2020-03-11 MED ORDER — MIDAZOLAM HCL 2 MG/2ML IJ SOLN
INTRAMUSCULAR | Status: AC
  Filled 2020-03-11: qty 2

## 2020-03-11 MED ORDER — BUPIVACAINE LIPOSOME 1.3 % IJ SUSP
INTRAMUSCULAR | Status: AC
  Filled 2020-03-11: qty 20

## 2020-03-11 MED ORDER — OXYCODONE HCL 5 MG PO TABS
5.0000 mg | ORAL_TABLET | ORAL | Status: DC | PRN
  Administered 2020-03-12: 10 mg via ORAL
  Administered 2020-03-13: 5 mg via ORAL
  Filled 2020-03-11: qty 2
  Filled 2020-03-11: qty 1

## 2020-03-11 MED ORDER — GABAPENTIN 300 MG PO CAPS: 300.0000 mg | ORAL_CAPSULE | ORAL | Status: AC

## 2020-03-11 MED ORDER — MEPERIDINE HCL 50 MG/ML IJ SOLN: 6.2500 mg | INTRAMUSCULAR | Status: DC | PRN

## 2020-03-11 MED ORDER — MIDAZOLAM HCL 2 MG/2ML IJ SOLN
INTRAMUSCULAR | Status: DC | PRN
  Administered 2020-03-11: 2 mg via INTRAVENOUS

## 2020-03-11 MED ORDER — SODIUM CHLORIDE 0.9 % IV SOLN
2.0000 g | Freq: Three times a day (TID) | INTRAVENOUS | Status: AC
  Administered 2020-03-11 – 2020-03-12 (×3): 2 g via INTRAVENOUS
  Filled 2020-03-11 (×3): qty 2

## 2020-03-11 MED ORDER — LIDOCAINE HCL (CARDIAC) PF 100 MG/5ML IV SOSY
PREFILLED_SYRINGE | INTRAVENOUS | Status: DC | PRN
  Administered 2020-03-11: 80 mg via INTRAVENOUS

## 2020-03-11 MED ORDER — BUPIVACAINE-EPINEPHRINE (PF) 0.5% -1:200000 IJ SOLN
INTRAMUSCULAR | Status: AC
  Filled 2020-03-11: qty 30

## 2020-03-11 MED ORDER — ENOXAPARIN SODIUM 40 MG/0.4ML ~~LOC~~ SOLN
40.0000 mg | SUBCUTANEOUS | Status: DC
  Administered 2020-03-12 – 2020-03-14 (×3): 40 mg via SUBCUTANEOUS
  Filled 2020-03-11 (×3): qty 0.4

## 2020-03-11 MED ORDER — TRAZODONE HCL 100 MG PO TABS: 100.0000 mg | ORAL_TABLET | Freq: Every evening | ORAL | Status: DC | PRN

## 2020-03-11 MED ORDER — FENTANYL CITRATE (PF) 100 MCG/2ML IJ SOLN
INTRAMUSCULAR | Status: AC
  Filled 2020-03-11: qty 2

## 2020-03-11 MED ORDER — SUGAMMADEX SODIUM 500 MG/5ML IV SOLN
INTRAVENOUS | Status: DC | PRN
  Administered 2020-03-11: 316 mg via INTRAVENOUS

## 2020-03-11 MED ORDER — ONDANSETRON HCL 4 MG/2ML IJ SOLN
INTRAMUSCULAR | Status: DC | PRN
  Administered 2020-03-11: 4 mg via INTRAVENOUS

## 2020-03-11 MED ORDER — FAMOTIDINE 20 MG PO TABS: 20.0000 mg | ORAL_TABLET | Freq: Once | ORAL | Status: AC

## 2020-03-11 MED ORDER — RISPERIDONE 1 MG PO TABS
2.0000 mg | ORAL_TABLET | Freq: Three times a day (TID) | ORAL | Status: DC
  Administered 2020-03-11 – 2020-03-14 (×8): 2 mg via ORAL
  Filled 2020-03-11 (×10): qty 2

## 2020-03-11 MED ORDER — ONDANSETRON HCL 4 MG/2ML IJ SOLN: 4.0000 mg | Freq: Once | INTRAMUSCULAR | Status: DC | PRN

## 2020-03-11 MED ORDER — BENZTROPINE MESYLATE 1 MG PO TABS
1.0000 mg | ORAL_TABLET | Freq: Every day | ORAL | Status: DC
  Administered 2020-03-11 – 2020-03-13 (×3): 1 mg via ORAL
  Filled 2020-03-11 (×4): qty 1

## 2020-03-11 MED ORDER — DEXAMETHASONE SODIUM PHOSPHATE 10 MG/ML IJ SOLN
INTRAMUSCULAR | Status: DC | PRN
  Administered 2020-03-11: 10 mg via INTRAVENOUS

## 2020-03-11 MED ORDER — FAMOTIDINE 20 MG PO TABS
ORAL_TABLET | ORAL | Status: AC
  Administered 2020-03-11: 20 mg via ORAL
  Filled 2020-03-11: qty 1

## 2020-03-11 MED ORDER — ALVIMOPAN 12 MG PO CAPS
12.0000 mg | ORAL_CAPSULE | Freq: Two times a day (BID) | ORAL | Status: DC
  Administered 2020-03-12 – 2020-03-14 (×5): 12 mg via ORAL
  Filled 2020-03-11 (×6): qty 1

## 2020-03-11 MED ORDER — ALBUMIN HUMAN 5 % IV SOLN
INTRAVENOUS | Status: AC
  Filled 2020-03-11: qty 250

## 2020-03-11 MED ORDER — ONDANSETRON 4 MG PO TBDP: 4.0000 mg | ORAL_TABLET | Freq: Four times a day (QID) | ORAL | Status: DC | PRN

## 2020-03-11 MED ORDER — SODIUM CHLORIDE 0.9 % IV SOLN
INTRAVENOUS | Status: AC
  Filled 2020-03-11: qty 2

## 2020-03-11 MED ORDER — ACETAMINOPHEN 500 MG PO TABS
ORAL_TABLET | ORAL | Status: AC
  Administered 2020-03-11: 1000 mg via ORAL
  Filled 2020-03-11: qty 2

## 2020-03-11 MED ORDER — KETOROLAC TROMETHAMINE 30 MG/ML IJ SOLN
INTRAMUSCULAR | Status: DC | PRN
  Administered 2020-03-11: 30 mg via INTRAVENOUS

## 2020-03-11 MED ORDER — LACTATED RINGERS IV SOLN: INTRAVENOUS | Status: DC

## 2020-03-11 MED ORDER — PROPOFOL 10 MG/ML IV BOLUS
INTRAVENOUS | Status: AC
  Filled 2020-03-11: qty 20

## 2020-03-11 MED ORDER — ORAL CARE MOUTH RINSE: 15.0000 mL | Freq: Once | OROMUCOSAL | Status: AC

## 2020-03-11 MED ORDER — LORATADINE 10 MG PO TABS
10.0000 mg | ORAL_TABLET | Freq: Every day | ORAL | Status: DC
  Administered 2020-03-11 – 2020-03-14 (×4): 10 mg via ORAL
  Filled 2020-03-11 (×4): qty 1

## 2020-03-11 MED ORDER — ALVIMOPAN 12 MG PO CAPS: 12.0000 mg | ORAL_CAPSULE | ORAL | Status: AC

## 2020-03-11 MED ORDER — PROPOFOL 10 MG/ML IV BOLUS
INTRAVENOUS | Status: DC | PRN
  Administered 2020-03-11: 150 mg via INTRAVENOUS

## 2020-03-11 MED ORDER — FENTANYL CITRATE (PF) 100 MCG/2ML IJ SOLN
INTRAMUSCULAR | Status: DC | PRN
Start: 2020-03-11 — End: 2020-03-11
  Administered 2020-03-11: 100 ug via INTRAVENOUS

## 2020-03-11 MED ORDER — HYDROMORPHONE HCL 1 MG/ML IJ SOLN
INTRAMUSCULAR | Status: AC
  Filled 2020-03-11: qty 1

## 2020-03-11 MED ORDER — FLUTICASONE PROPIONATE 50 MCG/ACT NA SUSP
2.0000 | Freq: Every day | NASAL | Status: DC | PRN
  Filled 2020-03-11: qty 16

## 2020-03-11 MED ORDER — ROCURONIUM BROMIDE 100 MG/10ML IV SOLN
INTRAVENOUS | Status: DC | PRN
  Administered 2020-03-11 (×3): 10 mg via INTRAVENOUS
  Administered 2020-03-11: 50 mg via INTRAVENOUS
  Administered 2020-03-11: 10 mg via INTRAVENOUS

## 2020-03-11 MED ORDER — KETAMINE HCL 10 MG/ML IJ SOLN
INTRAMUSCULAR | Status: DC | PRN
  Administered 2020-03-11: 30 mg via INTRAVENOUS

## 2020-03-11 SURGICAL SUPPLY — 86 items
ADAPTER CATH URET CONN 4-6FR (ADAPTER) ×4 IMPLANT
ADAPTER GOLDBERG URETERAL (ADAPTER) IMPLANT
BAG BILE T-TUBES STRL (MISCELLANEOUS) IMPLANT
BAG DRAIN CYSTO-URO LG1000N (MISCELLANEOUS) ×4 IMPLANT
BAG URINE DRAIN 2000ML AR STRL (UROLOGICAL SUPPLIES) ×4 IMPLANT
BLADE SURG SZ10 CARB STEEL (BLADE) ×4 IMPLANT
BRUSH SCRUB EZ  4% CHG (MISCELLANEOUS) ×2
BRUSH SCRUB EZ 4% CHG (MISCELLANEOUS) ×2 IMPLANT
CANISTER SUCT 1200ML W/VALVE (MISCELLANEOUS) ×4 IMPLANT
CATH FOLEY 2WAY  5CC 16FR (CATHETERS)
CATH FOLEY SIL 2WAY 14FR5CC (CATHETERS) IMPLANT
CATH URETL 5X70 OPEN END (CATHETERS) ×4 IMPLANT
CATH URTH 16FR FL 2W BLN LF (CATHETERS) IMPLANT
CHLORAPREP W/TINT 26 (MISCELLANEOUS) ×4 IMPLANT
CONRAY 43 FOR UROLOGY 50M (MISCELLANEOUS) IMPLANT
COVER WAND RF STERILE (DRAPES) ×16 IMPLANT
DECANTER SPIKE VIAL GLASS SM (MISCELLANEOUS) ×4 IMPLANT
DRSG OPSITE POSTOP 3X4 (GAUZE/BANDAGES/DRESSINGS) IMPLANT
DRSG OPSITE POSTOP 4X8 (GAUZE/BANDAGES/DRESSINGS) IMPLANT
ELECT CAUTERY BLADE 6.4 (BLADE) ×4 IMPLANT
ELECT REM PT RETURN 9FT ADLT (ELECTROSURGICAL) ×4
ELECTRODE REM PT RTRN 9FT ADLT (ELECTROSURGICAL) ×2 IMPLANT
GLOVE BIOGEL PI IND STRL 7.5 (GLOVE) ×2 IMPLANT
GLOVE BIOGEL PI INDICATOR 7.5 (GLOVE) ×2
GLOVE SURG SYN 7.0 (GLOVE) ×24 IMPLANT
GLOVE SURG SYN 7.5  E (GLOVE) ×12
GLOVE SURG SYN 7.5 E (GLOVE) ×12 IMPLANT
GOWN STRL REUS W/ TWL LRG LVL3 (GOWN DISPOSABLE) ×8 IMPLANT
GOWN STRL REUS W/ TWL XL LVL3 (GOWN DISPOSABLE) ×2 IMPLANT
GOWN STRL REUS W/TWL LRG LVL3 (GOWN DISPOSABLE) ×8
GOWN STRL REUS W/TWL XL LVL3 (GOWN DISPOSABLE) ×2
GUIDEWIRE STR DUAL SENSOR (WIRE) ×4 IMPLANT
HANDLE YANKAUER SUCT BULB TIP (MISCELLANEOUS) ×4 IMPLANT
HOLDER FOLEY CATH W/STRAP (MISCELLANEOUS) IMPLANT
IRRIGATION STRYKERFLOW (MISCELLANEOUS) IMPLANT
IRRIGATOR STRYKERFLOW (MISCELLANEOUS)
IV NS 1000ML (IV SOLUTION) ×2
IV NS 1000ML BAXH (IV SOLUTION) ×2 IMPLANT
KIT IMAGING PINPOINTPAQ (MISCELLANEOUS) ×8 IMPLANT
KIT TURNOVER CYSTO (KITS) ×4 IMPLANT
KIT TURNOVER KIT A (KITS) ×4 IMPLANT
LABEL OR SOLS (LABEL) ×4 IMPLANT
LIGASURE LAP MARYLAND 5MM 37CM (ELECTROSURGICAL) ×4 IMPLANT
NEEDLE HYPO 22GX1.5 SAFETY (NEEDLE) ×4 IMPLANT
NS IRRIG 1000ML POUR BTL (IV SOLUTION) ×4 IMPLANT
PACK COLON CLEAN CLOSURE (MISCELLANEOUS) ×4 IMPLANT
PACK CYSTO AR (MISCELLANEOUS) ×4 IMPLANT
PACK LAP CHOLECYSTECTOMY (MISCELLANEOUS) ×4 IMPLANT
PENCIL ELECTRO HAND CTR (MISCELLANEOUS) ×4 IMPLANT
RELOAD PROXIMATE 75MM BLUE (ENDOMECHANICALS) ×16 IMPLANT
RELOAD STAPLER LINEAR PROX 30 (STAPLE) IMPLANT
RETRACTOR WOUND ALXS 18CM MED (MISCELLANEOUS) IMPLANT
RETRACTOR WOUND ALXS 18CM SML (MISCELLANEOUS) ×2 IMPLANT
RTRCTR WOUND ALEXIS O 18CM MED (MISCELLANEOUS)
RTRCTR WOUND ALEXIS O 18CM SML (MISCELLANEOUS) ×4
SET CYSTO W/LG BORE CLAMP LF (SET/KITS/TRAYS/PACK) ×4 IMPLANT
SIDE-ARM FITTING (MISCELLANEOUS) IMPLANT
SLEEVE ADV FIXATION 5X100MM (TROCAR) ×12 IMPLANT
SOL .9 NS 3000ML IRR  AL (IV SOLUTION) ×2
SOL .9 NS 3000ML IRR UROMATIC (IV SOLUTION) ×2 IMPLANT
SPONGE LAP 18X18 RF (DISPOSABLE) ×8 IMPLANT
STAPLER GUN LINEAR PROX 60 (STAPLE) IMPLANT
STAPLER PROXIMATE 75MM BLUE (STAPLE) ×4 IMPLANT
STAPLER RELOAD LINEAR PROX 30 (STAPLE)
STAPLER SKIN PROX 35W (STAPLE) IMPLANT
STENT URET 6FRX24 CONTOUR (STENTS) IMPLANT
STENT URET 6FRX26 CONTOUR (STENTS) IMPLANT
SURGILUBE 2OZ TUBE FLIPTOP (MISCELLANEOUS) ×4 IMPLANT
SUT MNCRL 3-0 UNDYED SH (SUTURE) ×2 IMPLANT
SUT MONOCRYL 3-0 UNDYED (SUTURE) ×2
SUT PDS AB 1 CT1 36 (SUTURE) IMPLANT
SUT PDS AB 1 TP1 96 (SUTURE) IMPLANT
SUT SILK 0 (SUTURE)
SUT SILK 0 30XBRD TIE 6 (SUTURE) IMPLANT
SUT SILK 2-0 (SUTURE) IMPLANT
SUT SILK 3-0 (SUTURE) IMPLANT
SUT VICRYL 0 AB UR-6 (SUTURE) ×4 IMPLANT
SYRINGE IRR TOOMEY STRL 70CC (SYRINGE) IMPLANT
SYS KII FIOS ACCESS ABD 5X100 (TROCAR)
SYSTEM KII FIOS ACES ABD 5X100 (TROCAR) IMPLANT
TRAY FOLEY MTR SLVR 16FR STAT (SET/KITS/TRAYS/PACK) IMPLANT
TROCAR BALLN GELPORT 12X130M (ENDOMECHANICALS) ×4 IMPLANT
TROCAR Z-THREAD FIOS 11X100 BL (TROCAR) IMPLANT
TUBING ART PRESS 48 MALE/FEM (TUBING) IMPLANT
TUBING EVAC SMOKE HEATED PNEUM (TUBING) ×4 IMPLANT
WATER STERILE IRR 1000ML POUR (IV SOLUTION) ×4 IMPLANT

## 2020-03-11 NOTE — Op Note (Signed)
Preoperative diagnosis:  1. Colon-colonic intussusception with hepatic flexure mass; umbilical hernia  Postoperative diagnosis:  1. Same  Procedure: 1. Cystoscopy with bilateral ureteral catheterization and injection of indocyanine green  Surgeon: Abbie Sons, MD  Anesthesia: General  Complications: None  Intraoperative findings: Cystoscopy: Urethra normal in caliber without stricture; prostate nonocclusive; ureteral orifices orthotopic with clear reflux; bladder mucosa without erythema, solid or papillary lesions  EBL: None  Specimens: None  Indication: Carl [name]. is a 49 y.o. male with a history of incidental intussusception noted on CT and a mass in the hepatic flexure on colonoscopy who presents for laparoscopic right colectomy by Dr. Hampton Abbot.  Injection of ICG into the ureters bilaterally has been requested to aid in intraoperative ureteral localization.  Informed consent has been obtained.  Description of procedure:  The patient was taken to the operating room and general anesthesia was induced.  The patient was placed in the dorsal lithotomy position, prepped and draped in the usual sterile fashion, and preoperative antibiotics were administered. A preoperative time-out was performed.   A 21 French cystoscope was lubricated and passed per urethra and advanced in the bladder with findings as described above.  The right ureter orifice would not accept a 5 [place] open-ended ureteral catheter and a 0.038 Sensor wire was placed through the catheter into the right ureteral orifice.  The catheter was advanced approximately 3 cm into the distal ureter.  10 cc of ICG was injected into the right ureter.  The ureteral catheter was removed and an identical procedure was performed on the contralateral side.  The cystoscope was removed and a 16 French Foley catheter was placed without difficulty.  He was then repositioned, prepped and draped for his laparoscopic right colectomy  which was dictated separately by Dr. Hampton Abbot.   Abbie Sons, M.D.

## 2020-03-11 NOTE — Anesthesia Postprocedure Evaluation (Signed)
Anesthesia Post Note  Patient: Carl [name].  Procedure(s) Performed: LAPAROSCOPIC RIGHT COLECTOMY, possible open (Right ) Cystoscopy w/INDOCYANINE GREEN FLUORESCENCE IMAGING (ICG) (Bilateral )  Patient location during evaluation: PACU Anesthesia Type: General Level of consciousness: awake and alert Pain management: pain level controlled Vital Signs Assessment: post-procedure vital signs reviewed and stable Respiratory status: spontaneous breathing and respiratory function stable Cardiovascular status: stable Anesthetic complications: no   No complications documented.   Last Vitals:  Vitals:   03/11/20 1458 03/11/20 1513  BP: 128/80 132/90  Pulse: 81 76  Resp: (!) 9 12  Temp:    SpO2: 100% 100%    Last Pain:  Vitals:   03/11/20 1513  TempSrc:   PainSc: Asleep                 Rayaan Lorah K

## 2020-03-11 NOTE — Op Note (Signed)
Procedure Date:  03/11/2020  Pre-operative Diagnosis:  Colo-colonic Intussusception with hepatic flexure mass; umbilical hernia  Post-operative Diagnosis:  Same  Procedure:  Laparoscopic Right Colectomy, open umbilical hernia repair.  Surgeon:  Melvyn Neth, MD  Assistant:  Nestor Lewandowsky, MD.  His assistance was critical for appropriate retraction, exposure, anastomosis.  Also assisting, Edison Simon, PA-C; and Erenest Blank, PA-S  Anesthesia:  General endotracheal  Estimated Blood Loss:  75 ml  Specimens:  Right colon with terminal ileum  Complications:  None  Indications for Procedure:  This is a 49 y.o. male who presents with history of incidental intussusception noted on CT scan.  He was found to have a mass in the hepatic flexure on colonoscopy, and now presents for resection..  The benefits, complications, treatment options, and expected outcomes were discussed with the patient. The risks of bleeding, infection, bowel injury, and need for further procedures were all discussed with the patient and he was willing to proceed.  Description of Procedure: The patient was correctly identified in the preoperative area and brought into the operating room.  The patient was placed supine with VTE prophylaxis in place.  Appropriate time-outs were performed.  Anesthesia was induced and the patient was intubated.  Appropriate antibiotics were infused.  Dr. Bernardo Heater with Urology proceeded to do a cystoscopy with injection of ICG into each ureter.  Please see his operative note for details.  Foley catheter was placed.    The abdomen was prepped and draped in a sterile fashion. A supraumbilical incision was made. A cutdown technique was used to enter the abdominal cavity without injury, and a Hasson trocar was inserted.  Pneumoperitoneum was obtained with appropriate opening pressures.  Three 5-mm ports were placed in the right lower quadrant, low mid abdomen, and subxiphoid positions under  direct visualization.    We started with inspection of the abdominal cavity and there was no evidence of any extracolonic disease.  There were no peritoneal implants, no enlarged masses, and no masses on the liver.  The tattooed portion of the hepatic flexure was visualized.  We started mobilizing the right colon along the white line of [place] including portion of the terminal ileum and the appendix.  We carried the dissection and mobilization of the colon to the hepatic flexure.  Then we started mobilizing the hepatic flexure off the attachments that it had to the liver and gallbladder.  A combination of LigaSure and blunt dissection was used for all this mobilization.  We were able to visualize the duodenum and prevent any injury.  Once we had appropriate mobilization past the right branch of the middle colic bundles, we deflated the pneumoperitoneum and removed the [facility] trocar.  We extended the incision made for that trocar superiorly to make an incision measuring about 6 cm in length.  We used cautery to dissect down and extend the fascial incision as well. This incorporated the patient's umbilical hernia defect so it could be closed later.  The umbilical stalk was separated to facilitae this.  We put in a small wound protector through this incision and were able to pull out the colon and terminal ileum.    We picked an area in the transverse colon beyond the area of supply of the right branch of the middle colic.  Using a hemostat we created a window in the mesentery of the transverse colon and used a 5 mm blue load stapler to staple the colon.  We then went to the terminal ileum to an  area proximal to the supply of the ileocolic branches and created a window in the mesentery using a hemostat and fired a stapler in same fashion.  Then using LigaSure we were able to take down the mesentery from the terminal ileum to the transverse colon.  LigaSure was used to cauterize the pedicles of the ileocolic, right  colic, and right branch of the middle colic bundles.  The specimen then came off en bloc including terminal ileum, appendix, right colon, and proximal transverse colon.  This was sent out to pathology.  Then the blind ends of our staple lines were put together and lined up at the antimesenteric borders and secured in place using two 3-0 silk sutures.  Two enterotomies were created and  a GIA 75 mm blue load stapler was used to create a common channel between terminal ileum and transverse colon.  The same stapler was then used to close the opening of the common channel.  The intestines were then placed back into the abdomen and the wound protector was clamped and pneumoperitoneum was started again.  Were able to inspect the abdominal cavity and make sure that there were no twists within the bowel and the mesentery.  We found some adhesions from small bowel to the distal transverse colon which were taken down using LigaSure.  We then inspected the abdomen again assuring hemostasis on the right side.  At that point all the ports were removed and the wound protector was removed and pneumoperitoneum was released.    We then proceeded with our clean closure.  60 mL total of Exparel solution was then infiltrated in the peritoneum and fascia of the biggest incision as well as subcutaneously over the 3 remaining port sites.  The midline incision was then closed using #1 PDS suture x2, incorporating the umbilical defect into the closure.  The umbilical stalk was then attached to the fascia using 2-0 Vicryl.  The midline incision was then closed in two layers using 3-0 Vicryl and 4-0 Monocryl.  The remaining port incisions were closed using 4-0 Monocryl.  The wounds were cleaned and sealed with DermaBond.   The patient was emerged from anesthesia and extubated and brought to the recovery room for further management.   The patient tolerated the procedure well and all counts were correct at the end of the case.    Melvyn Neth, MD

## 2020-03-11 NOTE — Progress Notes (Signed)
I have been asked by Dr. Hampton Abbot for cystoscopy with injection ICG into the ureters bilaterally for intraoperative ureteral localization.  The procedure was cussed in detail with the patient including potential risks of infection.  All questions were answered.

## 2020-03-11 NOTE — Transfer of Care (Signed)
Immediate Anesthesia Transfer of Care Note  Patient: Carl [name].  Procedure(s) Performed: LAPAROSCOPIC RIGHT COLECTOMY, possible open (Right ) Cystoscopy w/INDOCYANINE GREEN FLUORESCENCE IMAGING (ICG) (Bilateral )  Patient Location: PACU  Anesthesia Type:General  Level of Consciousness: awake, alert  and oriented  Airway & Oxygen Therapy: Patient Spontanous Breathing and Patient connected to face mask oxygen  Post-op Assessment: Report given to RN and Post -op Vital signs reviewed and stable  Post vital signs: Reviewed and stable  Last Vitals:  Vitals Value Taken Time  BP 136/92 03/11/20 1428  Temp    Pulse 82 03/11/20 1431  Resp 10 03/11/20 1431  SpO2 100 % 03/11/20 1431  Vitals shown include unvalidated device data.  Last Pain:  Vitals:   03/11/20 0753  TempSrc: Oral  PainSc: 0-No pain         Complications: No complications documented.

## 2020-03-11 NOTE — Interval H&P Note (Signed)
History and Physical Interval Note:  03/11/2020 8:21 AM  Carl [name].  has presented today for surgery, with the diagnosis of Colo-colonic intussusception.  The various methods of treatment have been discussed with the patient and family. After consideration of risks, benefits and other options for treatment, the patient has consented to  Procedure(s): LAPAROSCOPIC RIGHT COLECTOMY, possible open (Right) Cystoscopy w/INDOCYANINE GREEN FLUORESCENCE IMAGING (ICG) (Bilateral) as a surgical intervention.  The patient's history has been reviewed, patient examined, no change in status, stable for surgery.  I have reviewed the patient's chart and labs.  Questions were answered to the patient's satisfaction.     Carl [name]

## 2020-03-11 NOTE — Anesthesia Preprocedure Evaluation (Signed)
Anesthesia Evaluation  Patient identified by MRN, date of birth, ID band Patient awake    Reviewed: Allergy & Precautions, NPO status , Patient's Chart, lab work & pertinent test results  History of Anesthesia Complications Negative for: history of anesthetic complications  Airway Mallampati: III  TM Distance: >3 FB Neck ROM: Full    Dental  (+)    Pulmonary neg pulmonary ROS, neg sleep apnea, neg COPD,    breath sounds clear to auscultation- rhonchi (-) wheezing      Cardiovascular Exercise Tolerance: Good (-) hypertension(-) CAD, (-) Past MI, (-) Cardiac Stents and (-) CABG  Rhythm:Regular Rate:Normal - Systolic murmurs and - Diastolic murmurs    Neuro/Psych neg Seizures PSYCHIATRIC DISORDERS Bipolar Disorder Schizophrenia    GI/Hepatic negative GI ROS, Neg liver ROS,   Endo/Other  negative endocrine ROSneg diabetes  Renal/GU negative Renal ROS     Musculoskeletal negative musculoskeletal ROS (+)   Abdominal (+) - obese,   Peds  Hematology negative hematology ROS (+)   Anesthesia Other Findings Past Medical History: No date: Abdominal pain No date: Constipation No date: Decreased visual acuity No date: Nevus No date: Polyuria No date: Recurrent boils     Comment:  scalp behind ear No date: Schizophrenia (HCC) No date: Seizures (HCC) No date: Soft tissue mass     Comment:  left scalp posterior   Reproductive/Obstetrics                             Anesthesia Physical Anesthesia Plan  ASA: II  Anesthesia Plan: General   Post-op Pain Management:    Induction: Intravenous  PONV Risk Score and Plan: 1 and Ondansetron, Dexamethasone and Midazolam  Airway Management Planned: Oral ETT  Additional Equipment:   Intra-op Plan:   Post-operative Plan: Extubation in OR  Informed Consent: I have reviewed the patients History and Physical, chart, labs and discussed the procedure  including the risks, benefits and alternatives for the proposed anesthesia with the patient or authorized representative who has indicated his/her understanding and acceptance.     Dental advisory given  Plan Discussed with: CRNA and Anesthesiologist  Anesthesia Plan Comments:         Anesthesia Quick Evaluation

## 2020-03-11 NOTE — Anesthesia Procedure Notes (Signed)
Procedure Name: Intubation Date/Time: 03/11/2020 9:48 AM Performed by: Willette Alma, CRNA Pre-anesthesia Checklist: Patient identified, Patient being monitored, Timeout performed, Emergency Drugs available and Suction available Patient Re-evaluated:Patient Re-evaluated prior to induction Oxygen Delivery Method: Circle system utilized Preoxygenation: Pre-oxygenation with 100% oxygen Induction Type: IV induction Ventilation: Mask ventilation without difficulty Laryngoscope Size: McGraph and 4 Grade View: Grade I Tube type: Oral Tube size: 8.0 mm Number of attempts: 1 Airway Equipment and Method: Stylet Placement Confirmation: ETT inserted through vocal cords under direct vision,  positive ETCO2 and breath sounds checked- equal and bilateral Secured at: 23 cm Tube secured with: Tape Dental Injury: Teeth and Oropharynx as per pre-operative assessment

## 2020-03-12 ENCOUNTER — Encounter: Payer: Self-pay | Admitting: Surgery

## 2020-03-12 LAB — CBC WITH DIFFERENTIAL/PLATELET
Abs Immature Granulocytes: 0.06 10*3/uL (ref 0.00–0.07)
Basophils Absolute: 0 10*3/uL (ref 0.0–0.1)
Basophils Relative: 0 %
Eosinophils Absolute: 0 10*3/uL (ref 0.0–0.5)
Eosinophils Relative: 0 %
HCT: 34.9 % — ABNORMAL LOW (ref 39.0–52.0)
Hemoglobin: 12.1 g/dL — ABNORMAL LOW (ref 13.0–17.0)
Immature Granulocytes: 1 %
Lymphocytes Relative: 7 %
Lymphs Abs: 0.9 10*3/uL (ref 0.7–4.0)
MCH: 27.5 pg (ref 26.0–34.0)
MCHC: 34.7 g/dL (ref 30.0–36.0)
MCV: 79.3 fL — ABNORMAL LOW (ref 80.0–100.0)
Monocytes Absolute: 1.2 10*3/uL — ABNORMAL HIGH (ref 0.1–1.0)
Monocytes Relative: 10 %
Neutro Abs: 10.7 10*3/uL — ABNORMAL HIGH (ref 1.7–7.7)
Neutrophils Relative %: 82 %
Platelets: 264 10*3/uL (ref 150–400)
RBC: 4.4 MIL/uL (ref 4.22–5.81)
RDW: 12.9 % (ref 11.5–15.5)
WBC: 12.9 10*3/uL — ABNORMAL HIGH (ref 4.0–10.5)
nRBC: 0 % (ref 0.0–0.2)

## 2020-03-12 LAB — BASIC METABOLIC PANEL
Anion gap: 5 (ref 5–15)
BUN: 11 mg/dL (ref 6–20)
CO2: 26 mmol/L (ref 22–32)
Calcium: 8.8 mg/dL — ABNORMAL LOW (ref 8.9–10.3)
Chloride: 104 mmol/L (ref 98–111)
Creatinine, Ser: 1.18 mg/dL (ref 0.61–1.24)
GFR calc Af Amer: >60 mL/min (ref >60)
GFR calc non Af Amer: >60 mL/min (ref >60)
Glucose, Bld: 108 mg/dL — ABNORMAL HIGH (ref 70–99)
Potassium: 4.2 mmol/L (ref 3.5–5.1)
Sodium: 135 mmol/L (ref 135–145)

## 2020-03-12 LAB — MAGNESIUM: Magnesium: 1.9 mg/dL (ref 1.7–2.4)

## 2020-03-12 LAB — HIV ANTIBODY (ROUTINE TESTING W REFLEX): HIV Screen 4th Generation wRfx: NONREACTIVE

## 2020-03-12 NOTE — Progress Notes (Signed)
[facility] Hospital Day(s): 1.   Post op day(s): 1 Day Post-Op.   Interval History:  Patient seen and examined no acute events or new complaints overnight.  Patient is overall doing well, he reports expected degree of incisional soreness No chills, nausea, emesis Mild leukocytosis to 12.9K; likely reactive from surgery; no fevers BMP is reassuring; renal function normal with scr - 1.18, UO - 2.1L No significant electrolyte derangement  Has been NPO post-operatively Continue on Cefotetan x3 doses post-op Has had BM recorded, but suspect this remains related to prep, no flatus Has not Mobilized  Vital signs in last 24 hours: [min-max] current  Temp:  [96.1 F (35.6 C)-98.2 F (36.8 C)] 97.5 F (36.4 C) (09/08 0505) Pulse Rate:  [76-118] 84 (09/08 0505) Resp:  [8-16] 14 (09/08 0505) BP: (110-146)/(72-103) 110/84 (09/08 0505) SpO2:  [95 %-100 %] 100 % (09/08 0505) Weight:  [79 kg] 79 kg (09/07 0753)     Height: 5\' 8"  (172.7 cm) Weight: 79 kg BMI (Calculated): 26.49   Intake/Output last 2 shifts:  09/07 0701 - 09/08 0700 In: 2941.1 [I.V.:2491.2; IV Piggyback:449.9] Out: 2185 [Urine:2150; Blood:35]   Physical Exam:  Constitutional: alert, cooperative and no distress  Respiratory: breathing non-labored at rest  Cardiovascular: regular rate and sinus rhythm  Gastrointestinal: Soft, incisional soreness, non-distended, no rebound/guarding Genitourinary: Foley place in palce Integumentary: Laparoscopic and mini-laparotomy incisions are CDI with dermabond, no erythema or drainage   Labs:  CBC Latest Ref Rng & Units 03/12/2020 03/07/2020 04/28/2019  WBC 4.0 - 10.5 K/uL 12.9(H) 4.9 4.6  Hemoglobin 13.0 - 17.0 g/dL 12.1(L) 14.9 15.0  Hematocrit 39 - 52 % 34.9(L) 43.7 44.2  Platelets 150 - 400 K/uL 264 289 198   CMP Latest Ref Rng & Units 03/12/2020 03/07/2020 04/28/2019  Glucose 70 - 99 mg/dL 108(H) 93 101(H)  BUN 6 - 20 mg/dL 11 16 15    Creatinine 0.61 - 1.24 mg/dL 1.18 1.28(H) 1.17  Sodium 135 - 145 mmol/L 135 138 141  Potassium 3.5 - 5.1 mmol/L 4.2 4.1 4.1  Chloride 98 - 111 mmol/L 104 101 102  CO2 22 - 32 mmol/L 26 31 27   Calcium 8.9 - 10.3 mg/dL 8.8(L) 9.7 9.6  Total Protein 6.5 - 8.1 g/dL - 7.4 8.0  Total Bilirubin 0.3 - 1.2 mg/dL - 0.8 0.5  Alkaline Phos 38 - 126 U/L - 41 36(L)  AST 15 - 41 U/L - 21 19  ALT 0 - 44 U/L - 29 24     Imaging studies: No new pertinent imaging studies   Assessment/Plan: 49 y.o. 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   - Okay for CLD  - Continue IVF Resuscitation  - Complete post-operative doses of Cefotetan  - Discontinue foley  - Monitor abdominal examination; on-going bowel function  - Pain control prn; antiemetics prn  - Monitor labs; CBC/BMP  - Mobilization encouraged  - Medical management of comorbidities  - DVT prophylaxis   All of the above findings and recommendations were discussed with the patient, and the medical team, and all of patient's questions were answered to his expressed satisfaction.  -- Edison Simon, PA-C [facility] 03/12/2020, 7:39 AM [phone] M-F: 7am - 4pm

## 2020-03-13 ENCOUNTER — Other Ambulatory Visit: Payer: Self-pay | Admitting: Pathology

## 2020-03-13 LAB — CBC
HCT: 31.9 % — ABNORMAL LOW (ref 39.0–52.0)
Hemoglobin: 11.5 g/dL — ABNORMAL LOW (ref 13.0–17.0)
MCH: 27.5 pg (ref 26.0–34.0)
MCHC: 36.1 g/dL — ABNORMAL HIGH (ref 30.0–36.0)
MCV: 76.3 fL — ABNORMAL LOW (ref 80.0–100.0)
Platelets: 214 10*3/uL (ref 150–400)
RBC: 4.18 MIL/uL — ABNORMAL LOW (ref 4.22–5.81)
RDW: 13.2 % (ref 11.5–15.5)
WBC: 8.4 10*3/uL (ref 4.0–10.5)
nRBC: 0 % (ref 0.0–0.2)

## 2020-03-13 LAB — BASIC METABOLIC PANEL
Anion gap: 7 (ref 5–15)
BUN: 9 mg/dL (ref 6–20)
CO2: 26 mmol/L (ref 22–32)
Calcium: 8.9 mg/dL (ref 8.9–10.3)
Chloride: 106 mmol/L (ref 98–111)
Creatinine, Ser: 1.15 mg/dL (ref 0.61–1.24)
GFR calc Af Amer: >60 mL/min (ref >60)
GFR calc non Af Amer: >60 mL/min (ref >60)
Glucose, Bld: 87 mg/dL (ref 70–99)
Potassium: 3.9 mmol/L (ref 3.5–5.1)
Sodium: 139 mmol/L (ref 135–145)

## 2020-03-13 NOTE — Progress Notes (Signed)
[facility] Hospital Day(s): 2.   Post op day(s): 2 Days Post-Op.   Interval History:  Patient seen and examined no acute events or new complaints overnight.  Patient reports he is doing well but very strange affect He reports abdominal soreness at incisions No fever, chills, nausea, or emesis Likely reactive leukocytosis from OR has now resolved, WBC is 8.4k this morning Likely dilution anemia with Hgb 11.5 BMP is reassuring with sCr - 1.15, no electrolyte derangement Good UO - 1.1K Started on CLD yesterday; tolerated well He did report a bowel movement this morning Has not mobilized outside room   Vital signs in last 24 hours: [min-max] current  Temp:  [97.8 F (36.6 C)-98.7 F (37.1 C)] 98.7 F (37.1 C) (09/09 0534) Pulse Rate:  [78-95] 78 (09/09 0534) Resp:  [16] 16 (09/09 0534) BP: (113-132)/(75-92) 121/75 (09/09 0534) SpO2:  [99 %-100 %] 100 % (09/09 0534)     Height: 5\' 8"  (172.7 cm) Weight: 79 kg BMI (Calculated): 26.49   Intake/Output last 2 shifts:  09/08 0701 - 09/09 0700 In: 817.4 [I.V.:817.4] Out: 1150 [Urine:1150]   Physical Exam:  Constitutional: alert, cooperative and no distress  Respiratory: breathing non-labored at rest  Cardiovascular: regular rate and sinus rhythm  Gastrointestinal: Soft, incisional soreness, non-distended, no rebound/guarding Integumentary: Laparoscopic and mini-laparotomy incisions are CDI with dermabond, no erythema or drainage    Labs:  CBC Latest Ref Rng & Units 03/13/2020 03/12/2020 03/07/2020  WBC 4.0 - 10.5 K/uL 8.4 12.9(H) 4.9  Hemoglobin 13.0 - 17.0 g/dL 11.5(L) 12.1(L) 14.9  Hematocrit 39 - 52 % 31.9(L) 34.9(L) 43.7  Platelets 150 - 400 K/uL 214 264 289   CMP Latest Ref Rng & Units 03/13/2020 03/12/2020 03/07/2020  Glucose 70 - 99 mg/dL 87 108(H) 93  BUN 6 - 20 mg/dL 9 11 16   Creatinine 0.61 - 1.24 mg/dL 1.15 1.18 1.28(H)  Sodium 135 - 145 mmol/L 139 135 138  Potassium 3.5 - 5.1  mmol/L 3.9 4.2 4.1  Chloride 98 - 111 mmol/L 106 104 101  CO2 22 - 32 mmol/L 26 26 31   Calcium 8.9 - 10.3 mg/dL 8.9 8.8(L) 9.7  Total Protein 6.5 - 8.1 g/dL - - 7.4  Total Bilirubin 0.3 - 1.2 mg/dL - - 0.8  Alkaline Phos 38 - 126 U/L - - 41  AST 15 - 41 U/L - - 21  ALT 0 - 44 U/L - - 29    Imaging studies: No new pertinent imaging studies   Assessment/Plan:  49 y.o. male overall doing well 2 Days Post-Op s/p laparoscopic right colectomy and umbilical hernia repair for colo-colonic intussusception with hepatic flexure mass and umbilical hernia mass.    - Will advance to full liquids this morning             - Wean IVF Resuscitation  - Monitor abdominal examination; on-going bowel function             - Pain control prn; antiemetics prn   - Mobilization encouraged             - Medical management of comorbidities             - DVT prophylaxis   All of the above findings and recommendations were discussed with the patient, and the medical team, and all of patient's questions were answered to his expressed satisfaction.  -- Edison Simon, PA-C  [facility] 03/13/2020, 7:11 AM [phone] M-F: 7am - 4pm

## 2020-03-14 ENCOUNTER — Inpatient Hospital Stay: Payer: Medicare Other

## 2020-03-14 MED ORDER — OXYCODONE HCL 5 MG PO TABS
5.0000 mg | ORAL_TABLET | ORAL | 0 refills | Status: DC | PRN
Start: 2020-03-14 — End: 2020-03-26

## 2020-03-14 MED ORDER — IBUPROFEN 800 MG PO TABS: 800.0000 mg | ORAL_TABLET | Freq: Three times a day (TID) | ORAL | 0 refills | Status: DC | PRN

## 2020-03-14 NOTE — Discharge Instructions (Signed)
In addition to included general post-operative instructions for right hemicolectomy,  Diet: Resume home heart healthy diet.   Activity: No heavy lifting >20 pounds (children, pets, laundry, garbage) for 6 weeks, but light activity and walking are encouraged. Do not drive or drink alcohol if taking narcotic pain medications or having pain that might distract from driving.  Wound care: You may shower/get incision wet with soapy water and pat dry (do not rub incisions), but no baths or submerging incision underwater until follow-up.   Medications: Resume all home medications. For mild to moderate pain: acetaminophen (Tylenol) or ibuprofen/naproxen (if no kidney disease). Combining Tylenol with alcohol can substantially increase your risk of causing liver disease. Narcotic pain medications, if prescribed, can be used for severe pain, though may cause nausea, constipation, and drowsiness. Do not combine Tylenol and Percocet (or similar) within a 6 hour period as Percocet (and similar) contain(s) Tylenol. If you do not need the narcotic pain medication, you do not need to fill the prescription.  Call office [phone] / [phone]) at any time if any questions, worsening pain, fevers/chills, bleeding, drainage from incision site, or other concerns.

## 2020-03-14 NOTE — Discharge Summary (Signed)
[facility] SURGICAL DISCHARGE SUMMARY  Patient ID: Carl [name]. MRN: [id number] DOB/AGE: [birth year] 49 y.o.  Admit date: 03/11/2020 Discharge date: 03/14/2020  Discharge Diagnoses Patient Active Problem List   Diagnosis Date Noted  . Intussusception of colon ([place]) 03/11/2020  . Umbilical hernia without obstruction and without gangrene   . Colonic mass   . Abnormal CT scan, colon   . Polyp of colon     Consultants Urology   Procedures 03/11/2020:  Laparoscopic Right Colectomy, open umbilical hernia repair.  03/11/2020:  Cystoscopy with bilateral ureteral catheterization and injection of indocyanine green   HPI: This is a 49 y.o. male who presents with history of incidental intussusception noted on CT scan.  He was found to have a mass in the hepatic flexure on colonoscopy, and now presents for resection..  The benefits, complications, treatment options, and expected outcomes were discussed with the patient. The risks of bleeding, infection, bowel injury, and need for further procedures were all discussed with the patient and he was willing to proceed.  Hospital Course: Informed consent was obtained and documented, and patient underwent uneventful Laparoscopic Right Colectomy, open umbilical hernia repair. (Dr Hampton Abbot, 03/11/2020).  Post-operatively, patient's did well and advancement of patient's diet and ambulation were well-tolerated. The remainder of patient's hospital course was essentially unremarkable, and discharge planning was initiated accordingly with patient safely able to be discharged home with appropriate discharge instructions, pain control, and outpatient follow-up after all of his questions were answered to his expressed satisfaction.   Discharge Condition: Good   Physical Examination:  Constitutional: alert, cooperative and no distress  Respiratory: breathing non-labored at rest  Cardiovascular: regular rate and sinus rhythm   Gastrointestinal:Soft, incisional soreness, non-distended, no rebound/guarding Integumentary:Laparoscopic and mini-laparotomy incisions are CDI with dermabond, no erythema or drainage   Allergies as of 03/14/2020   No Known Allergies     Medication List    TAKE these medications   benztropine 1 MG tablet Commonly known as: COGENTIN Take 1 mg by mouth at bedtime.   cetirizine 10 MG tablet Commonly known as: ZYRTEC Take 10 mg by mouth daily as needed for allergies.   docusate sodium 100 MG capsule Commonly known as: COLACE Take 1 capsule (100 mg total) by mouth daily. What changed: when to take this   fluticasone 50 MCG/ACT nasal spray Commonly known as: FLONASE Place 2 sprays into the nose daily as needed for allergies.   ibuprofen 800 MG tablet Commonly known as: ADVIL Take 1 tablet (800 mg total) by mouth every 8 (eight) hours as needed.   oxyCODONE 5 MG immediate release tablet Commonly known as: Oxy IR/ROXICODONE Take 1 tablet (5 mg total) by mouth every 4 (four) hours as needed for severe pain or breakthrough pain.   QUEtiapine 100 MG tablet Commonly known as: SEROQUEL Take 100 mg by mouth at bedtime.   risperiDONE 2 MG tablet Commonly known as: RISPERDAL Take 2 mg by mouth in the morning, at noon, and at bedtime.   traZODone 100 MG tablet Commonly known as: DESYREL Take 1 tablet (100 mg total) by mouth at bedtime as needed for sleep. What changed: when to take this         Follow-up Information    Piscoya, Jacqulyn Bath, MD. Schedule an appointment as soon as possible for a visit in 1 week(s).   Specialty: General Surgery Why: s/p right hemicolectomy Contact information: [address] [place] [address] [address] [phone]  Time spent on discharge management including discussion of hospital course, clinical condition, outpatient instructions, prescriptions, and follow up with the patient and members of the medical team:  >30 minutes  -- Edison Simon , PA-C  [facility]  03/14/2020, 1:25 PM [phone] M-F: 7am - 4pm

## 2020-03-14 NOTE — Care Management Important Message (Signed)
Important Message  Patient Details  Name: Carl [name]. MRN: [id number] Date of Birth: May 20, 1971   Medicare Important Message Given:  Yes     Dannette Barbara 03/14/2020, 1:44 PM

## 2020-03-17 ENCOUNTER — Telehealth: Payer: Self-pay | Admitting: Emergency Medicine

## 2020-03-17 NOTE — Progress Notes (Signed)
03/17/20  Called the patient's mother today and discussed pathology results.  Patient has schizophrenia and his mother is the caregiver.  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  All 27 lymph nodes were negative for malignancy.  Staging is T1N0 (Stage 1).  MMR testing is pending for now.  Patient has follow up with me on 9/22 and will set up Oncology referral then.  Olean Ree, MD

## 2020-03-17 NOTE — Telephone Encounter (Signed)
Called patient and spoke to mom [name]). Appointment set up for 03/26/20 at 2:15pm. Advised mother that Dr Hampton Abbot will call with pathology results. Mother voiced understanding.

## 2020-03-20 ENCOUNTER — Other Ambulatory Visit: Payer: Medicare Other

## 2020-03-20 NOTE — Progress Notes (Signed)
Tumor Board Documentation  Rice Walsh. was presented by Dr Hampton Abbot at our Tumor Board on 03/20/2020, which included representatives from medical oncology, radiation oncology, surgical oncology, internal medicine, navigation, pathology, radiology, surgical, pharmacy, genetics, research, pulmonology.  Junius currently presents as an external consult, for [place], for new positive pathology with history of the following treatments: surgical intervention(s).  Additionally, we reviewed previous medical and familial history, history of present illness, and recent lab results along with all available histopathologic and imaging studies. The tumor board considered available treatment options and made the following recommendations:   Refer to Medical Oncology and Genetics Counselor when final path available  The following procedures/referrals were also placed: No orders of the defined types were placed in this encounter.   Clinical Trial Status: not discussed   Staging used: Pathologic Stage, To be determined  AJCC Staging: T: 1 N: 0   Group: Adenocarcinoma of Colon  National site-specific guidelines   were discussed with respect to the case.  Tumor board is a meeting of clinicians from various specialty areas who evaluate and discuss patients for whom a multidisciplinary approach is being considered. Final determinations in the plan of care are those of the provider(s). The responsibility for follow up of recommendations given during tumor board is that of the provider.   Today's extended care, comprehensive team conference, [place] was not present for the discussion and was not examined.   Multidisciplinary Tumor Board is a multidisciplinary case peer review process.  Decisions discussed in the Multidisciplinary Tumor Board reflect the opinions of the specialists present at the conference without having examined the patient.  Ultimately, treatment and diagnostic decisions rest with the primary  provider(s) and the patient.

## 2020-03-26 ENCOUNTER — Ambulatory Visit (INDEPENDENT_AMBULATORY_CARE_PROVIDER_SITE_OTHER): Payer: Medicare Other | Admitting: Surgery

## 2020-03-26 ENCOUNTER — Encounter: Payer: Self-pay | Admitting: Surgery

## 2020-03-26 ENCOUNTER — Other Ambulatory Visit: Payer: Self-pay

## 2020-03-26 VITALS — BP 125/88 | HR 92 | Temp 98.1°F | Ht 68.0 in | Wt 171.6 lb

## 2020-03-26 DIAGNOSIS — Z09 Encounter for follow-up examination after completed treatment for conditions other than malignant neoplasm: Secondary | ICD-10-CM

## 2020-03-26 DIAGNOSIS — C189 Malignant neoplasm of colon, unspecified: Secondary | ICD-10-CM | POA: Insufficient documentation

## 2020-03-26 DIAGNOSIS — C183 Malignant neoplasm of hepatic flexure: Secondary | ICD-10-CM

## 2020-03-26 NOTE — Progress Notes (Signed)
03/26/2020  HPI: Carl [name]. is a 49 y.o. male s/p laparoscopic right colectomy for hepatic flexure mass causing intussusception on 03/11/20.  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  Patient reports that he's doing well.  Denies any significant abdominal pain, nausea, or vomiting.  Reports eating well and having regular bowel function.    Vital signs: BP 125/88   Pulse 92   Temp 98.1 F (36.7 C)   Ht 5\' 8"  (1.727 m)   Wt 171 lb 9.6 oz (77.8 kg)   SpO2 98%   BMI 26.09 kg/m    Physical Exam: Constitutional:  No acute distress Abdomen:  Soft, non-distended, non-tender to palpation. Incisions are healing well, are clean, dry, intact.  No evidence of infection or hernia.  Assessment/Plan: This is a 49 y.o. male s/p laparoscopic right colectomy.  --Discussed pathology findings again with the patient and his mother.  Based on the findings, will send referral to Medical Oncology for further evaluation.  Based on discussions at Tumor Board, anticipate he will not need chemotherapy.  Discussed with him that going forwards, we'd be doing follow up visits to check on him, get a colonoscopy within a year.  --Follow up with me in 4 months with repeat CEA.   Melvyn Neth, [name]

## 2020-03-26 NOTE — Patient Instructions (Addendum)
We have sent a referral for you to see Medical Oncology, they will call to schedule this appointment.   We will see you back here in 4 months with lab work done prior. We will send a letter about this appointment.     GENERAL POST-OPERATIVE PATIENT INSTRUCTIONS   WOUND CARE INSTRUCTIONS:  Keep a dry clean dressing on the wound if there is drainage. The initial bandage may be removed after 24 hours.  Once the wound has quit draining you may leave it open to air.  If clothing rubs against the wound or causes irritation and the wound is not draining you may cover it with a dry dressing during the daytime.  Try to keep the wound dry and avoid ointments on the wound unless directed to do so.  If the wound becomes bright red and painful or starts to drain infected material that is not clear, please contact your physician immediately.  If the wound is mildly pink and has a thick firm ridge underneath it, this is normal, and is referred to as a healing ridge.  This will resolve over the next 4-6 weeks.  BATHING: You may take a bath now.  DIET:  You may eat any foods that you can tolerate.  It is a good idea to eat a high fiber diet and take in plenty of fluids to prevent constipation.  If you do become constipated you may want to take a mild laxative or take ducolax tablets on a daily basis until your bowel habits are regular.  Constipation can be very uncomfortable, along with straining, after recent surgery.  ACTIVITY:  You are encouraged to cough and deep breath or use your incentive spirometer if you were given one, every 15-30 minutes when awake.  This will help prevent respiratory complications and low grade fevers post-operatively if you had a general anesthetic.  You may want to hug a pillow when coughing and sneezing to add additional support to the surgical area, if you had abdominal or chest surgery, which will decrease pain during these times.  You are encouraged to walk and engage in light  activity for the next two weeks.  You should not lift more than 20 pounds for 6 weeks after surgery as it could put you at increased risk for complications.  Twenty pounds is roughly equivalent to a plastic bag of groceries. At that time- Listen to your body when lifting, if you have pain when lifting, stop and then try again in a few days. Soreness after doing exercises or activities of daily living is normal as you get back in to your normal routine.  MEDICATIONS:  Try to take narcotic medications and anti-inflammatory medications, such as tylenol, ibuprofen, naprosyn, etc., with food.  This will minimize stomach upset from the medication.  Should you develop nausea and vomiting from the pain medication, or develop a rash, please discontinue the medication and contact your physician.  You should not drive, make important decisions, or operate machinery when taking narcotic pain medication.  SUNBLOCK Use sun block to incision area over the next year if this area will be exposed to sun. This helps decrease scarring and will allow you avoid a permanent darkened area over your incision.  QUESTIONS:  Please feel free to call our office if you have any questions, and we will be glad to assist you.

## 2020-03-31 ENCOUNTER — Encounter: Payer: Self-pay | Admitting: Oncology

## 2020-03-31 ENCOUNTER — Other Ambulatory Visit: Payer: Self-pay

## 2020-03-31 ENCOUNTER — Inpatient Hospital Stay: Payer: Medicare Other

## 2020-03-31 ENCOUNTER — Inpatient Hospital Stay: Payer: Medicare Other | Attending: Oncology | Admitting: Oncology

## 2020-03-31 VITALS — BP 118/76 | HR 85 | Temp 97.4°F | Resp 18 | Ht 68.0 in | Wt 174.1 lb

## 2020-03-31 DIAGNOSIS — C189 Malignant neoplasm of colon, unspecified: Secondary | ICD-10-CM

## 2020-03-31 DIAGNOSIS — D500 Iron deficiency anemia secondary to blood loss (chronic): Secondary | ICD-10-CM

## 2020-03-31 DIAGNOSIS — D649 Anemia, unspecified: Secondary | ICD-10-CM | POA: Diagnosis not present

## 2020-03-31 DIAGNOSIS — R569 Unspecified convulsions: Secondary | ICD-10-CM

## 2020-03-31 DIAGNOSIS — F309 Manic episode, unspecified: Secondary | ICD-10-CM | POA: Diagnosis not present

## 2020-03-31 DIAGNOSIS — Z85038 Personal history of other malignant neoplasm of large intestine: Secondary | ICD-10-CM | POA: Insufficient documentation

## 2020-03-31 DIAGNOSIS — F209 Schizophrenia, unspecified: Secondary | ICD-10-CM | POA: Diagnosis not present

## 2020-03-31 DIAGNOSIS — Z809 Family history of malignant neoplasm, unspecified: Secondary | ICD-10-CM

## 2020-03-31 DIAGNOSIS — Z8042 Family history of malignant neoplasm of prostate: Secondary | ICD-10-CM | POA: Diagnosis not present

## 2020-03-31 DIAGNOSIS — Z803 Family history of malignant neoplasm of breast: Secondary | ICD-10-CM | POA: Insufficient documentation

## 2020-03-31 LAB — CBC WITH DIFFERENTIAL/PLATELET
Abs Immature Granulocytes: 0.05 10*3/uL (ref 0.00–0.07)
Basophils Absolute: 0 10*3/uL (ref 0.0–0.1)
Basophils Relative: 1 %
Eosinophils Absolute: 0.1 10*3/uL (ref 0.0–0.5)
Eosinophils Relative: 2 %
HCT: 34.3 % — ABNORMAL LOW (ref 39.0–52.0)
Hemoglobin: 12.2 g/dL — ABNORMAL LOW (ref 13.0–17.0)
Immature Granulocytes: 1 %
Lymphocytes Relative: 32 %
Lymphs Abs: 1.8 10*3/uL (ref 0.7–4.0)
MCH: 27.4 pg (ref 26.0–34.0)
MCHC: 35.6 g/dL (ref 30.0–36.0)
MCV: 77.1 fL — ABNORMAL LOW (ref 80.0–100.0)
Monocytes Absolute: 0.6 10*3/uL (ref 0.1–1.0)
Monocytes Relative: 11 %
Neutro Abs: 3.1 10*3/uL (ref 1.7–7.7)
Neutrophils Relative %: 53 %
Platelets: 362 10*3/uL (ref 150–400)
RBC: 4.45 MIL/uL (ref 4.22–5.81)
RDW: 14.2 % (ref 11.5–15.5)
WBC: 5.7 10*3/uL (ref 4.0–10.5)
nRBC: 0 % (ref 0.0–0.2)

## 2020-03-31 LAB — RETIC PANEL
Immature Retic Fract: 14.3 % (ref 2.3–15.9)
RBC.: 4.51 MIL/uL (ref 4.22–5.81)
Retic Count, Absolute: 114.6 10*3/uL (ref 19.0–186.0)
Retic Ct Pct: 2.5 % (ref 0.4–3.1)
Reticulocyte Hemoglobin: 30.9 pg (ref >27.9)

## 2020-03-31 LAB — IRON AND TIBC
Iron: 64 ug/dL (ref 45–182)
Saturation Ratios: 20 % (ref 17.9–39.5)
TIBC: 325 ug/dL (ref 250–450)
UIBC: 261 ug/dL

## 2020-03-31 LAB — HEPATIC FUNCTION PANEL
ALT: 17 U/L (ref 0–44)
AST: 22 U/L (ref 15–41)
Albumin: 3.8 g/dL (ref 3.5–5.0)
Alkaline Phosphatase: 46 U/L (ref 38–126)
Bilirubin, Direct: <0.1 mg/dL (ref 0.0–0.2)
Total Bilirubin: 0.4 mg/dL (ref 0.3–1.2)
Total Protein: 7.1 g/dL (ref 6.5–8.1)

## 2020-03-31 LAB — FERRITIN: Ferritin: 50 ng/mL (ref 24–336)

## 2020-03-31 NOTE — Progress Notes (Signed)
Pt here to establish care.

## 2020-03-31 NOTE — Progress Notes (Signed)
Hematology/Oncology Consult note [facility] [phone] [phone]   Patient Care Team: Donnie Coffin, MD as PCP - General (Family Medicine) Clent Jacks, RN as Oncology Nurse Navigator  REFERRING PROVIDER: Olean Ree, MD  CHIEF COMPLAINTS/REASON FOR VISIT:  Evaluation of colon cancer  HISTORY OF PRESENTING ILLNESS:   Carl Satz. is a  49 y.o.  male with PMH listed below was seen in consultation at the request of  Olean Ree, MD  for evaluation of colon cancer  01/26/2020, CT abdomen pelvis at [facility] showed intussusception of the large bowel at the hepatic flexure with irregular bowel wall thickening. Patient established care with gastroenterology and had colonoscopy on 02/14/2020 which showed 5 to 7 mm polyps in ascending colon and in the cecum.  Resected and retrieved.  5 mm polyp in the ascending colon, resected and retrieved. There is a tumor found at 60 cm proximal to the anus.  Biopsied.  Pathology showed tubular adenoma x2, tubulovillous adenoma x1, 60 cm to anus biopsy showed villous adenoma, definitive high-grade dysplasia is not identified. 02/28/2020, patient had another colonoscopy to place the tatoo and bed placement.  Colonoscopy showed large polyp found in the hepatic flexure.  Polyp was multilobulated.   5 mm polyp in the sigmoid colon, resected and retrieved.  Pathology showed serrated polyp negative for dysplasia and malignancy.  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  All margins were negative for invasive carcinoma.  No lymphovascular invasion or perineural invasion was identified. pT1 pN0.  Patient referred to oncology to discuss about diagnosis and management plan. Patient is accompanied by his  mother. Denies any family history of colon cancer.  Family history is positive for maternal uncle who was diagnosed with prostate cancer.  Maternal great aunt was diagnosed with breast cancer. Patient has no new complaints.  He has history of schizophrenia and seizure.   Review of Systems  Constitutional: Negative for appetite change, chills, fatigue, fever and unexpected weight change.  HENT:   Negative for hearing loss and voice change.   Eyes: Negative for eye problems and icterus.  Respiratory: Negative for chest tightness, cough and shortness of breath.   Cardiovascular: Negative for chest pain and leg swelling.  Gastrointestinal: Negative for abdominal distention and abdominal pain.  Endocrine: Negative for hot flashes.  Genitourinary: Negative for difficulty urinating, dysuria and frequency.   Musculoskeletal: Negative for arthralgias.  Skin: Negative for itching and rash.  Neurological: Negative for light-headedness and numbness.  Hematological: Negative for adenopathy. Does not bruise/bleed easily.  Psychiatric/Behavioral: Negative for confusion.    MEDICAL HISTORY:  Past Medical History:  Diagnosis Date  . Abdominal pain   . Constipation   . Decreased visual acuity   . Nevus   . Polyuria   . Recurrent boils    scalp behind ear  . Schizophrenia ([place])   . Seizures ([place])   . Soft tissue mass    left scalp posterior    SURGICAL HISTORY: Past Surgical History:  Procedure Laterality Date  . COLONOSCOPY WITH PROPOFOL N/A 02/14/2020   Procedure: COLONOSCOPY WITH PROPOFOL;  Surgeon: Virgel Manifold, MD;  Location: [facility];  Service: Endoscopy;  Laterality: N/A;  . COLONOSCOPY WITH PROPOFOL N/A 02/28/2020   Procedure: COLONOSCOPY WITH PROPOFOL;  Surgeon: Virgel Manifold, MD;  Location: [facility];  Service: Endoscopy;  Laterality:  N/A;  . LAPAROSCOPIC RIGHT COLECTOMY Right 03/11/2020   Procedure: LAPAROSCOPIC RIGHT COLECTOMY, possible open;  Surgeon:  Olean Ree, MD;  Location: [facility] ORS;  Service: General;  Laterality: Right;  . MASS EXCISION Left 02/11/2015   Procedure: EXCISION MASS, left post neck;  Surgeon: Florene Glen, MD;  Location: [place];  Service: General;  Laterality: Left;    SOCIAL HISTORY: Social History   Socioeconomic History  . Marital status: Single    Spouse name: Not on file  . Number of children: Not on file  . Years of education: Not on file  . Highest education level: Not on file  Occupational History  . Not on file  Tobacco Use  . Smoking status: Never Smoker  . Smokeless tobacco: Never Used  Vaping Use  . Vaping Use: Never used  Substance and Sexual Activity  . Alcohol use: No    Alcohol/week: 0.0 standard drinks  . Drug use: No  . Sexual activity: Not on file  Other Topics Concern  . Not on file  Social History Narrative  . Not on file   Social Determinants of Health   Financial Resource Strain:   . Difficulty of Paying Living Expenses: Not on file  Food Insecurity:   . Worried About Charity fundraiser in the Last Year: Not on file  . Ran Out of Food in the Last Year: Not on file  Transportation Needs:   . Lack of Transportation (Medical): Not on file  . Lack of Transportation (Non-Medical): Not on file  Physical Activity:   . Days of Exercise per Week: Not on file  . Minutes of Exercise per Session: Not on file  Stress:   . Feeling of Stress : Not on file  Social Connections:   . Frequency of Communication with Friends and Family: Not on file  . Frequency of Social Gatherings with Friends and Family: Not on file  . Attends Religious Services: Not on file  . Active Member of Clubs or Organizations: Not on file  . Attends Archivist Meetings: Not on file  . Marital Status: Not on file  Intimate Partner Violence:   . Fear of Current or Ex-Partner: Not on file  . Emotionally Abused: Not on file  . Physically Abused: Not on file  . Sexually Abused: Not on  file    FAMILY HISTORY: Family History  Problem Relation Age of Onset  . Hyperlipidemia Mother   . Hypertension Mother   . Fibromyalgia Mother   . Hypertension Father   . Diabetes Father   . Hyperlipidemia Father     ALLERGIES:  has No Known Allergies.  MEDICATIONS:  Current Outpatient Medications  Medication Sig Dispense Refill  . benztropine (COGENTIN) 1 MG tablet Take 1 mg by mouth at bedtime.     . cetirizine (ZYRTEC) 10 MG tablet Take 10 mg by mouth daily as needed for allergies.    Marland Kitchen docusate sodium (COLACE) 100 MG capsule Take 1 capsule (100 mg total) by mouth daily. (Patient taking differently: Take 100 mg by mouth at bedtime. ) 30 capsule 1  . fluticasone (FLONASE) 50 MCG/ACT nasal spray Place 2 sprays into the nose daily as needed for allergies.     Marland Kitchen ibuprofen (ADVIL) 800 MG tablet Take 1 tablet (800 mg total) by mouth every 8 (eight) hours as needed. 30 tablet 0  . QUEtiapine (SEROQUEL) 100 MG tablet Take 100 mg by mouth at bedtime.    . risperiDONE (RISPERDAL)  2 MG tablet Take 2 mg by mouth in the morning, at noon, and at bedtime.     . traZODone (DESYREL) 100 MG tablet Take 1 tablet (100 mg total) by mouth at bedtime as needed for sleep. (Patient taking differently: Take 100 mg by mouth at bedtime. ) 30 tablet 1   No current facility-administered medications for this visit.     PHYSICAL EXAMINATION: ECOG PERFORMANCE STATUS: 0 - Asymptomatic Vitals:   03/31/20 1509  BP: 118/76  Pulse: 85  Resp: 18  Temp: (!) 97.4 F (36.3 C)   Filed Weights   03/31/20 1509  Weight: 174 lb 1.6 oz (79 kg)    Physical Exam Constitutional:      General: He is not in acute distress. HENT:     Head: Normocephalic and atraumatic.  Eyes:     General: No scleral icterus. Cardiovascular:     Rate and Rhythm: Normal rate and regular rhythm.     Heart sounds: Normal heart sounds.  Pulmonary:     Effort: Pulmonary effort is normal. No respiratory distress.     Breath sounds:  No wheezing.  Abdominal:     General: Bowel sounds are normal. There is no distension.     Palpations: Abdomen is soft.  Musculoskeletal:        General: No deformity. Normal range of motion.     Cervical back: Normal range of motion and neck supple.  Skin:    General: Skin is warm and dry.     Findings: No erythema or rash.  Neurological:     Mental Status: He is alert and oriented to person, place, and time. Mental status is at baseline.     Cranial Nerves: No cranial nerve deficit.     Coordination: Coordination normal.  Psychiatric:        Mood and Affect: Mood normal.     LABORATORY DATA:  I have reviewed the data as listed Lab Results  Component Value Date   WBC 5.7 03/31/2020   HGB 12.2 (L) 03/31/2020   HCT 34.3 (L) 03/31/2020   MCV 77.1 (L) 03/31/2020   PLT 362 03/31/2020   Recent Labs    04/28/19 1336 04/28/19 1336 03/07/20 1217 03/12/20 0402 03/13/20 0425 03/31/20 1541  NA 141   < > 138 135 139  --   K 4.1   < > 4.1 4.2 3.9  --   CL 102   < > 101 104 106  --   CO2 27   < > 31 26 26   --   GLUCOSE 101*   < > 93 108* 87  --   BUN 15   < > 16 11 9   --   CREATININE 1.17   < > 1.28* 1.18 1.15  --   CALCIUM 9.6   < > 9.7 8.8* 8.9  --   GFRNONAA >60   < > >60 >60 >60  --   GFRAA >60   < > >60 >60 >60  --   PROT 8.0  --  7.4  --   --  7.1  ALBUMIN 4.5  --  4.2  --   --  3.8  AST 19  --  21  --   --  22  ALT 24  --  29  --   --  17  ALKPHOS 36*  --  41  --   --  46  BILITOT 0.5  --  0.8  --   --  0.4  BILIDIR  --   --   --   --   --  <0.1  IBILI  --   --   --   --   --  NOT CALCULATED   < > = values in this interval not displayed.   Iron/TIBC/Ferritin/ %Sat    Component Value Date/Time   IRON 64 03/31/2020 1541   TIBC 325 03/31/2020 1541   FERRITIN 50 03/31/2020 1541   IRONPCTSAT 20 03/31/2020 1541      RADIOGRAPHIC STUDIES: I have personally reviewed the radiological images as listed and agreed with the findings in the report. DG Abd 1  View  Result Date: 03/14/2020 CLINICAL DATA:  Ileus EXAM: ABDOMEN - 1 VIEW COMPARISON:  None. FINDINGS: There is mild stool in the colon. There is no bowel dilatation or air-fluid level to suggest bowel obstruction. No free air. There are probable phleboliths in the pelvis. IMPRESSION: No bowel dilatation to suggest ileus or bowel obstruction. No free air appreciable on supine examination. Electronically Signed   By: Lowella Grip III M.D.   On: 03/14/2020 08:51      ASSESSMENT & PLAN:  1. Malignant neoplasm of colon, unspecified part of colon ([place])   2. Family history of cancer   3. Iron deficiency anemia due to chronic blood loss    #Stage I colon cancer status post right hemicolectomy. Pathology was in been reviewed by me and discussed with patient and mother. No need for adjuvant chemotherapy.  Recommend patient to repeat colonoscopy at 1 year mark. Follow-up with labs and history physical in 6 months.  #Family history of cancer, personal history of colon cancer-age of onset less than 50 Refer to genetic counselor.  #Anemia, likely secondary to chronic blood loss. Check CBC, LFT, iron, TIBC ferritin. Labs are reviewed.  Hemoglobin has improved since surgery.  Continue monitor.  Iron labs showed adequate iron.  Orders Placed This Encounter  Procedures  . CBC with Differential/Platelet    Standing Status:   Future    Number of Occurrences:   1    Standing Expiration Date:   03/31/2021  . Hepatic function panel    Standing Status:   Future    Number of Occurrences:   1    Standing Expiration Date:   03/31/2021  . Ferritin    Standing Status:   Future    Number of Occurrences:   1    Standing Expiration Date:   03/31/2021  . Iron and TIBC    Standing Status:   Future    Number of Occurrences:   1    Standing Expiration Date:   03/31/2021  . Retic Panel    Standing Status:   Future    Number of Occurrences:   1    Standing Expiration Date:   03/31/2021  . Ambulatory  referral to Genetics    Referral Priority:   Routine    Referral Type:   Consultation    Referral Reason:   Specialty Services Required    Referred to Provider:   Faith Rogue T    Number of Visits Requested:   1    All questions were answered. The patient knows to call the clinic with any problems questions or concerns.   cc Olean Ree, MD    Return of visit: 6 months   Carl Server, MD, PhD Hematology Oncology [facility] Pager- [phone] 03/31/2020

## 2020-04-01 ENCOUNTER — Encounter: Payer: Self-pay | Admitting: Oncology

## 2020-04-01 LAB — SURGICAL PATHOLOGY

## 2020-04-08 ENCOUNTER — Encounter: Payer: Self-pay | Admitting: Oncology

## 2020-04-08 DIAGNOSIS — Z7189 Other specified counseling: Secondary | ICD-10-CM | POA: Insufficient documentation

## 2020-05-20 ENCOUNTER — Telehealth (INDEPENDENT_AMBULATORY_CARE_PROVIDER_SITE_OTHER): Payer: Medicare Other | Admitting: Gastroenterology

## 2020-05-20 ENCOUNTER — Encounter: Payer: Self-pay | Admitting: Gastroenterology

## 2020-05-20 DIAGNOSIS — C189 Malignant neoplasm of colon, unspecified: Secondary | ICD-10-CM | POA: Diagnosis not present

## 2020-05-20 NOTE — Progress Notes (Signed)
Carl Antigua, MD [address]  [place]  [place], [place] [address]  Main: [phone]  Fax: [phone]   Primary Care Physician: Carl Coffin, MD  Virtual Visit via Telephone Note  I connected with patient on 05/20/20 at  2:15 PM EST by telephone and verified that I am speaking with the correct person using two identifiers.   I discussed the limitations, risks, security and privacy concerns of performing an evaluation and management service by telephone and the availability of in person appointments. I also discussed with the patient that there may be a patient responsible charge related to this service. The patient expressed understanding and agreed to proceed.  Location of Patient: Home Location of Provider: Home Persons involved: Patient, his father and mother, and provider only during the visit (nursing staff and front desk staff was involved in communicating with the patient prior to the appointment, reviewing medications and checking them in)   History of Present Illness: Chief Complaint  Patient presents with  . Colon mass    Patient stated that he had been doing well with no complaints.     HPI: Carl [name]. is a 49 y.o. male who was found to have large colon polyp on colonoscopy done last year in August 2021.  Pathology showed villous adenoma.  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  27 lymph nodes negative for malignancy.  Patient has been seen by Dr. Tasia [name] of oncology and no chemotherapy is recommended.  Repeat colonoscopy has been recommended in 1 year.  The patient denies abdominal or flank pain, anorexia, nausea or vomiting, dysphagia, change in bowel habits or black or bloody stools or weight loss.   Current Outpatient Medications  Medication Sig Dispense Refill  . benztropine (COGENTIN) 1 MG tablet Take 1  mg by mouth at bedtime.     . cetirizine (ZYRTEC) 10 MG tablet Take 10 mg by mouth daily as needed for allergies.    Marland Kitchen docusate sodium (COLACE) 100 MG capsule Take 1 capsule (100 mg total) by mouth daily. (Patient taking differently: Take 100 mg by mouth at bedtime. ) 30 capsule 1  . fluPHENAZine (PROLIXIN) 5 MG tablet Take 5 mg by mouth at bedtime.    . fluticasone (FLONASE) 50 MCG/ACT nasal spray Place 2 sprays into the nose daily as needed for allergies.     Marland Kitchen QUEtiapine (SEROQUEL) 100 MG tablet Take 100 mg by mouth at bedtime.    . risperidone (RISPERDAL) 4 MG tablet Take 1 tablet by mouth daily.    . traZODone (DESYREL) 100 MG tablet Take 1 tablet (100 mg total) by mouth at bedtime as needed for sleep. (Patient taking differently: Take 100 mg by mouth at bedtime. ) 30 tablet 1   No current facility-administered medications for this visit.    Allergies as of 05/20/2020  . (No Known Allergies)    Review of Systems:    All systems reviewed and negative except where noted in HPI.   Observations/Objective:  Labs: CMP     Component Value Date/Time   NA 139 03/13/2020 0425   K 3.9 03/13/2020 0425   CL 106 03/13/2020 0425   CO2 26 03/13/2020 0425   GLUCOSE 87 03/13/2020 0425   BUN 9 03/13/2020 0425   CREATININE 1.15 03/13/2020 0425   CALCIUM 8.9 03/13/2020 0425   PROT 7.1 03/31/2020 1541   ALBUMIN 3.8 03/31/2020 1541  AST 22 03/31/2020 1541   ALT 17 03/31/2020 1541   ALKPHOS 46 03/31/2020 1541   BILITOT 0.4 03/31/2020 1541   GFRNONAA >60 03/13/2020 0425   GFRAA >60 03/13/2020 0425   Lab Results  Component Value Date   WBC 5.7 03/31/2020   HGB 12.2 (L) 03/31/2020   HCT 34.3 (L) 03/31/2020   MCV 77.1 (L) 03/31/2020   PLT 362 03/31/2020    Imaging Studies: No results found.  Assessment and Plan:   Carl [name]. is a 49 y.o. y/o male with colon cancer diagnosed in August 2021, now status post right hemicolectomy here for follow-up  Assessment and Plan: Patient  doing well post surgery  Patient will need repeat colonoscopy in August 2022 and this was discussed with him.  We will set a recall in his chart and I have asked patient and his family to call us back in August 2022 as well to schedule his repeat exam and they verbalized understanding  Follow Up Instructions:    I discussed the assessment and treatment plan with the patient. The patient was provided an opportunity to ask questions and all were answered. The patient agreed with the plan and demonstrated an understanding of the instructions.   The patient was advised to call back or seek an in-person evaluation if the symptoms worsen or if the condition fails to improve as anticipated.  I provided 12 minutes of non-face-to-face time during this encounter. Additional time was spent in reviewing patient's chart, placing orders etc.   Carl Manifold, MD  Speech recognition software was used to dictate this note.

## 2020-06-18 ENCOUNTER — Other Ambulatory Visit: Payer: Self-pay

## 2020-06-18 DIAGNOSIS — C183 Malignant neoplasm of hepatic flexure: Secondary | ICD-10-CM

## 2020-07-23 ENCOUNTER — Ambulatory Visit: Payer: Medicare Other | Admitting: Surgery

## 2020-07-25 ENCOUNTER — Telehealth: Payer: Self-pay

## 2020-07-25 NOTE — Telephone Encounter (Signed)
Patient needs to have CEA lab work done at [facility] prior to visit. Unable to leave a message for him about this.

## 2020-07-28 ENCOUNTER — Other Ambulatory Visit
Admission: RE | Admit: 2020-07-28 | Discharge: 2020-07-28 | Disposition: A | Discharge status: Home / Self Care | Payer: Medicare Other | Source: Ambulatory Visit | Attending: Surgery | Admitting: Surgery

## 2020-07-28 ENCOUNTER — Ambulatory Visit (INDEPENDENT_AMBULATORY_CARE_PROVIDER_SITE_OTHER): Payer: Medicare Other | Admitting: Surgery

## 2020-07-28 ENCOUNTER — Encounter: Payer: Self-pay | Admitting: Surgery

## 2020-07-28 ENCOUNTER — Other Ambulatory Visit: Payer: Self-pay

## 2020-07-28 VITALS — BP 133/85 | HR 98 | Temp 98.4°F | Ht 68.75 in | Wt 180.8 lb

## 2020-07-28 DIAGNOSIS — C183 Malignant neoplasm of hepatic flexure: Secondary | ICD-10-CM | POA: Diagnosis not present

## 2020-07-28 NOTE — Progress Notes (Signed)
07/28/2020  History of Present Illness: Carl [name]. is a 50 y.o. male status post laparoscopic right colectomy on 03/31/2020 for cancer of the hepatic flexure.  He presents today for his first 17-month follow-up.  Today, he reports that he has been doing well.  Denies any abdominal pain, nausea, or vomiting.  He reports normal bowel function.  Reports a couple of episodes of noting some blood in the stool he strains really hard but otherwise denies any other issues.  He last saw Dr. Tasia Catchings on 03/31/2020.  There is no need for adjuvant chemotherapy he will follow-up with Dr. Tasia Catchings on 10/01/20.  Past Medical History: Past Medical History:  Diagnosis Date  . Abdominal pain   . Constipation   . Decreased visual acuity   . Nevus   . Polyuria   . Recurrent boils    scalp behind ear  . Schizophrenia ([place])   . Seizures ([place])   . Soft tissue mass    left scalp posterior     Past Surgical History: Past Surgical History:  Procedure Laterality Date  . COLONOSCOPY WITH PROPOFOL N/A 02/14/2020   Procedure: COLONOSCOPY WITH PROPOFOL;  Surgeon: Virgel Manifold, MD;  Location: [facility];  Service: Endoscopy;  Laterality: N/A;  . COLONOSCOPY WITH PROPOFOL N/A 02/28/2020   Procedure: COLONOSCOPY WITH PROPOFOL;  Surgeon: Virgel Manifold, MD;  Location: [facility];  Service: Endoscopy;  Laterality: N/A;  . LAPAROSCOPIC RIGHT COLECTOMY Right 03/11/2020   Procedure: LAPAROSCOPIC RIGHT COLECTOMY, possible open;  Surgeon: Olean Ree, MD;  Location: [facility]S;  Service: General;  Laterality: Right;  . MASS EXCISION Left 02/11/2015   Procedure: EXCISION MASS, left post neck;  Surgeon: Florene Glen, MD;  Location: [place];  Service: General;  Laterality: Left;    Home Medications: Prior to Admission medications   Medication Sig Start Date End Date Taking? Authorizing Provider  benztropine (COGENTIN) 1 MG tablet Take 1 mg by mouth at bedtime.  04/16/19  Yes [provider]   cetirizine (ZYRTEC) 10 MG tablet Take 10 mg by mouth daily as needed for allergies.   Yes [provider]  docusate sodium (COLACE) 100 MG capsule Take 1 capsule (100 mg total) by mouth daily. Patient taking differently: Take 100 mg by mouth at bedtime. 09/13/17  Yes Pucilowska, Jolanta B, MD  fluPHENAZine (PROLIXIN) 5 MG tablet Take 5 mg by mouth at bedtime. 04/16/20  Yes [provider]  fluticasone (FLONASE) 50 MCG/ACT nasal spray Place 2 sprays into the nose daily as needed for allergies.  01/02/16  Yes [provider]  QUEtiapine (SEROQUEL) 100 MG tablet Take 100 mg by mouth at bedtime.   Yes [provider]  risperidone (RISPERDAL) 4 MG tablet Take 1 tablet by mouth daily. 04/16/20  Yes [provider]  traZODone (DESYREL) 100 MG tablet Take 1 tablet (100 mg total) by mouth at bedtime as needed for sleep. Patient taking differently: Take 100 mg by mouth at bedtime. 09/12/17  Yes Pucilowska, Jolanta B, MD    Allergies: No Known Allergies  Review of Systems: Review of Systems  Constitutional: Negative for chills and fever.  Respiratory: Negative for shortness of breath.   Cardiovascular: Negative for chest pain.  Gastrointestinal: Positive for blood in stool. Negative for abdominal pain, nausea and vomiting.    Physical Exam BP 133/85   Pulse 98   Temp 98.4 F (36.9 C) (Oral)   Ht 5' 8.75" (1.746 m)   Wt 180 lb 12.8 oz (  82 kg)   SpO2 95%   BMI 26.89 kg/m  CONSTITUTIONAL: No acute distress HEENT:  Normocephalic, atraumatic, extraocular motion intact. RESPIRATORY: Normal respiratory effort without pathologic use of accessory muscles. CARDIOVASCULAR: Regular rhythm and rate. GI: The abdomen is soft, nondistended, nontender to palpation.  Incisions are well-healed without any evidence of hernia.  No palpable masses. NEUROLOGIC:  Motor and sensation is grossly normal.  Cranial nerves are grossly intact. PSYCH:  Alert and oriented to  person, place and time. Affect is normal.  Assessment and Plan: This is a 50 y.o. male status post laparoscopic right colectomy.  -Patient is currently doing very well without any concerning issues.  He did not get his CEA obtained prior to this visit but he will go today to the lab to get this drawn. -Patient will require colonoscopy within a year from his surgery.  We will set up referral with Dr. Bonna Gains on his next follow-up appointment. -Patient will follow-up in 4 more months with a repeat CEA.  Face-to-face time spent with the patient and care providers was 15 minutes, with more than 50% of the time spent counseling, educating, and coordinating care of the patient.     Melvyn Neth, [facility]

## 2020-07-28 NOTE — Patient Instructions (Signed)
You can go to [facility] to have your lab drawn today. Please follow up in 4 months. Someone will call you in April to schedule your follow up appointment.

## 2020-07-29 LAB — CEA: CEA: 1.6 ng/mL (ref 0.0–4.7)

## 2020-07-29 NOTE — Progress Notes (Signed)
07/29/20  Repeat CEA was normal at 1.6.  Results released to patient.  Follow up in 4 months with repeat CEA.  Olean Ree, MD

## 2020-09-29 ENCOUNTER — Other Ambulatory Visit: Payer: Self-pay

## 2020-09-29 DIAGNOSIS — C189 Malignant neoplasm of colon, unspecified: Secondary | ICD-10-CM

## 2020-09-30 ENCOUNTER — Ambulatory Visit: Payer: Medicare Other | Admitting: Oncology

## 2020-09-30 ENCOUNTER — Inpatient Hospital Stay: Payer: Medicare Other | Attending: Oncology

## 2020-09-30 DIAGNOSIS — Z803 Family history of malignant neoplasm of breast: Secondary | ICD-10-CM | POA: Diagnosis not present

## 2020-09-30 DIAGNOSIS — C182 Malignant neoplasm of ascending colon: Secondary | ICD-10-CM | POA: Diagnosis not present

## 2020-09-30 DIAGNOSIS — C189 Malignant neoplasm of colon, unspecified: Secondary | ICD-10-CM

## 2020-09-30 LAB — CBC WITH DIFFERENTIAL/PLATELET
Abs Immature Granulocytes: 0.01 10*3/uL (ref 0.00–0.07)
Basophils Absolute: 0 10*3/uL (ref 0.0–0.1)
Basophils Relative: 0 %
Eosinophils Absolute: 0.1 10*3/uL (ref 0.0–0.5)
Eosinophils Relative: 2 %
HCT: 42.1 % (ref 39.0–52.0)
Hemoglobin: 14.5 g/dL (ref 13.0–17.0)
Immature Granulocytes: 0 %
Lymphocytes Relative: 38 %
Lymphs Abs: 1.7 10*3/uL (ref 0.7–4.0)
MCH: 27.2 pg (ref 26.0–34.0)
MCHC: 34.4 g/dL (ref 30.0–36.0)
MCV: 79 fL — ABNORMAL LOW (ref 80.0–100.0)
Monocytes Absolute: 0.4 10*3/uL (ref 0.1–1.0)
Monocytes Relative: 9 %
Neutro Abs: 2.3 10*3/uL (ref 1.7–7.7)
Neutrophils Relative %: 51 %
Platelets: 246 10*3/uL (ref 150–400)
RBC: 5.33 MIL/uL (ref 4.22–5.81)
RDW: 13.7 % (ref 11.5–15.5)
WBC: 4.5 10*3/uL (ref 4.0–10.5)
nRBC: 0 % (ref 0.0–0.2)

## 2020-09-30 LAB — COMPREHENSIVE METABOLIC PANEL
ALT: 22 U/L (ref 0–44)
AST: 17 U/L (ref 15–41)
Albumin: 4.1 g/dL (ref 3.5–5.0)
Alkaline Phosphatase: 52 U/L (ref 38–126)
Anion gap: 10 (ref 5–15)
BUN: 16 mg/dL (ref 6–20)
CO2: 26 mmol/L (ref 22–32)
Calcium: 9.5 mg/dL (ref 8.9–10.3)
Chloride: 101 mmol/L (ref 98–111)
Creatinine, Ser: 1.38 mg/dL — ABNORMAL HIGH (ref 0.61–1.24)
GFR, Estimated: >60 mL/min (ref >60)
Glucose, Bld: 132 mg/dL — ABNORMAL HIGH (ref 70–99)
Potassium: 3.7 mmol/L (ref 3.5–5.1)
Sodium: 137 mmol/L (ref 135–145)
Total Bilirubin: 0.6 mg/dL (ref 0.3–1.2)
Total Protein: 7.1 g/dL (ref 6.5–8.1)

## 2020-10-01 ENCOUNTER — Encounter: Payer: Self-pay | Admitting: Oncology

## 2020-10-01 ENCOUNTER — Inpatient Hospital Stay (HOSPITAL_BASED_OUTPATIENT_CLINIC_OR_DEPARTMENT_OTHER): Payer: Medicare Other | Admitting: Oncology

## 2020-10-01 ENCOUNTER — Other Ambulatory Visit: Payer: Self-pay

## 2020-10-01 VITALS — BP 134/96 | HR 90 | Temp 97.0°F | Resp 16 | Wt 180.7 lb

## 2020-10-01 DIAGNOSIS — C189 Malignant neoplasm of colon, unspecified: Secondary | ICD-10-CM | POA: Diagnosis not present

## 2020-10-01 DIAGNOSIS — Z809 Family history of malignant neoplasm, unspecified: Secondary | ICD-10-CM | POA: Diagnosis not present

## 2020-10-01 DIAGNOSIS — C182 Malignant neoplasm of ascending colon: Secondary | ICD-10-CM | POA: Diagnosis not present

## 2020-10-01 LAB — CEA: CEA: 1.4 ng/mL (ref 0.0–4.7)

## 2020-10-01 NOTE — Progress Notes (Signed)
Patient denies new problems/concerns today.   °

## 2020-10-01 NOTE — Progress Notes (Signed)
Hematology/Oncology follow up note [facility] [phone] [phone]   Patient Care Team: Donnie Coffin, MD as PCP - General (Family Medicine) Earlie Server, MD as Consulting Physician (Oncology) Olean Ree, MD as Consulting Physician (General Surgery) Clent Jacks, RN as Oncology Nurse Navigator  REFERRING PROVIDER: Donnie Coffin, MD  CHIEF COMPLAINTS/REASON FOR VISIT:  Follow-up colon cancer  HISTORY OF PRESENTING ILLNESS:   Carl Scully. is a  50 y.o.  male with PMH listed below was seen in consultation at the request of  Donnie Coffin, MD  for evaluation of colon cancer  01/26/2020, CT abdomen pelvis at [facility] showed intussusception of the large bowel at the hepatic flexure with irregular bowel wall thickening. Patient established care with gastroenterology and had colonoscopy on 02/14/2020 which showed 5 to 7 mm polyps in ascending colon and in the cecum.  Resected and retrieved.  5 mm polyp in the ascending colon, resected and retrieved. There is a tumor found at 60 cm proximal to the anus.  Biopsied.  Pathology showed tubular adenoma x2, tubulovillous adenoma x1, 60 cm to anus biopsy showed villous adenoma, definitive high-grade dysplasia is not identified. 02/28/2020, patient had another colonoscopy to place the tatoo and bed placement.  Colonoscopy showed large polyp found in the hepatic flexure.  Polyp was multilobulated.   5 mm polyp in the sigmoid colon, resected and retrieved.  Pathology showed serrated polyp negative for dysplasia and malignancy.  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  All margins were negative for invasive carcinoma.  No lymphovascular invasion or perineural invasion was identified. pT1 pN0.    Patient referred to oncology to discuss about diagnosis and management plan. Patient is accompanied by his mother. Denies any family history of colon cancer.  Family history is positive for maternal uncle who was diagnosed with prostate cancer.  Maternal great aunt was diagnosed with breast cancer. Patient has no new complaints.  He has history of schizophrenia and seizure.  INTERVAL HISTORY Carl Golson. is a 50 y.o. male who has above history reviewed by me today presents for follow up visit for management of stage I colon cancer Problems and complaints are listed below: Patient was accompanied by mother.  He is a poor historian. No new complaints.  Denies any nausea vomiting diarrhea, black or bloody stool.   Review of Systems  Constitutional: Negative for appetite change, chills, fatigue, fever and unexpected weight change.  HENT:   Negative for hearing loss and voice change.   Eyes: Negative for eye problems and icterus.  Respiratory: Negative for chest tightness, cough and shortness of breath.   Cardiovascular: Negative for chest pain and leg swelling.  Gastrointestinal: Negative for abdominal distention and abdominal pain.  Endocrine: Negative for hot flashes.  Genitourinary: Negative for difficulty urinating, dysuria and frequency.   Musculoskeletal: Negative for arthralgias.  Skin: Negative for itching and rash.  Neurological: Negative for light-headedness and numbness.  Hematological: Negative for adenopathy. Does not bruise/bleed easily.  Psychiatric/Behavioral: Negative for confusion.    MEDICAL HISTORY:  Past Medical History:  Diagnosis Date  . Abdominal pain   . Constipation   . Decreased visual acuity   . Nevus   . Polyuria   . Recurrent boils    scalp behind ear  . Schizophrenia ([place])   . Seizures ([place])   .  Soft tissue mass    left scalp posterior    SURGICAL HISTORY: Past Surgical History:  Procedure Laterality Date  . COLONOSCOPY WITH PROPOFOL  N/A 02/14/2020   Procedure: COLONOSCOPY WITH PROPOFOL;  Surgeon: Virgel Manifold, MD;  Location: [facility] END[facility]COPY;  Service: Endoscopy;  Laterality: N/A;  . COLONOSCOPY WITH PROPOFOL N/A 02/28/2020   Procedure: COLONOSCOPY WITH PROPOFOL;  Surgeon: Virgel Manifold, MD;  Location: [facility] ENDOSCOPY;  Service: Endoscopy;  Laterality: N/A;  . LAPAROSCOPIC RIGHT COLECTOMY Right 03/11/2020   Procedure: LAPAROSCOPIC RIGHT COLECTOMY, possible open;  Surgeon: Olean Ree, MD;  Location: [facility] ORS;  Service: General;  Laterality: Right;  . MASS EXCISION Left 02/11/2015   Procedure: EXCISION MASS, left post neck;  Surgeon: Florene Glen, MD;  Location: [place];  Service: General;  Laterality: Left;    SOCIAL HISTORY: Social History   Socioeconomic History  . Marital status: Single    Spouse name: Not on file  . Number of children: Not on file  . Years of education: Not on file  . Highest education level: Not on file  Occupational History  . Not on file  Tobacco Use  . Smoking status: Never Smoker  . Smokeless tobacco: Never Used  Vaping Use  . Vaping Use: Never used  Substance and Sexual Activity  . Alcohol use: No    Alcohol/week: 0.0 standard drinks  . Drug use: No  . Sexual activity: Not on file  Other Topics Concern  . Not on file  Social History Narrative  . Not on file   Social Determinants of Health   Financial Resource Strain: Not on file  Food Insecurity: Not on file  Transportation Needs: Not on file  Physical Activity: Not on file  Stress: Not on file  Social Connections: Not on file  Intimate Partner Violence: Not on file    FAMILY HISTORY: Family History  Problem Relation Age of Onset  . Hyperlipidemia Mother   . Hypertension Mother   . Fibromyalgia Mother   . Hypertension Father   . Diabetes Father   . Hyperlipidemia Father     ALLERGIES:  has No Known Allergies.  MEDICATIONS:  Current Outpatient Medications  Medication Sig Dispense  Refill  . benztropine (COGENTIN) 1 MG tablet Take 1 mg by mouth at bedtime.     . cetirizine (ZYRTEC) 10 MG tablet Take 10 mg by mouth daily as needed for allergies.    Marland Kitchen docusate sodium (COLACE) 100 MG capsule Take 1 capsule (100 mg total) by mouth daily. (Patient taking differently: Take 100 mg by mouth at bedtime.) 30 capsule 1  . fluPHENAZine (PROLIXIN) 5 MG tablet Take 5 mg by mouth at bedtime.    . fluticasone (FLONASE) 50 MCG/ACT nasal spray Place 2 sprays into the nose daily as needed for allergies.     Marland Kitchen QUEtiapine (SEROQUEL) 100 MG tablet Take 100 mg by mouth at bedtime.    . risperidone (RISPERDAL) 4 MG tablet Take 1 tablet by mouth daily.    . traZODone (DESYREL) 100 MG tablet Take 1 tablet (100 mg total) by mouth at bedtime as needed for sleep. (Patient taking differently: Take 100 mg by mouth at bedtime.) 30 tablet 1   No current facility-administered medications for this visit.     PHYSICAL EXAMINATION: ECOG PERFORMANCE STATUS: 0 - Asymptomatic Vitals:   10/01/20 1310  BP: (!) 134/96  Pulse: 90  Resp: 16  Temp: (!) 97 F (36.1 C)   Filed Weights  10/01/20 1310  Weight: 180 lb 11.2 oz (82 kg)    Physical Exam Constitutional:      General: He is not in acute distress. HENT:     Head: Normocephalic and atraumatic.  Eyes:     General: No scleral icterus. Cardiovascular:     Rate and Rhythm: Normal rate and regular rhythm.     Heart sounds: Normal heart sounds.  Pulmonary:     Effort: Pulmonary effort is normal. No respiratory distress.     Breath sounds: No wheezing.  Abdominal:     General: Bowel sounds are normal. There is no distension.     Palpations: Abdomen is soft.  Musculoskeletal:        General: No deformity. Normal range of motion.     Cervical back: Normal range of motion and neck supple.  Skin:    General: Skin is warm and dry.     Findings: No erythema or rash.  Neurological:     Mental Status: He is alert. Mental status is at baseline.      Cranial Nerves: No cranial nerve deficit.     Coordination: Coordination normal.  Psychiatric:     Comments: Flat affect     LABORATORY DATA:  I have reviewed the data as listed Lab Results  Component Value Date   WBC 4.5 09/30/2020   HGB 14.5 09/30/2020   HCT 42.1 09/30/2020   MCV 79.0 (L) 09/30/2020   PLT 246 09/30/2020   Recent Labs    03/07/20 1217 03/12/20 0402 03/13/20 0425 03/31/20 1541 09/30/20 1358  NA 138 135 139  --  137  K 4.1 4.2 3.9  --  3.7  CL 101 104 106  --  101  CO2 31 26 26   --  26  GLUCOSE 93 108* 87  --  132*  BUN 16 11 9   --  16  CREATININE 1.28* 1.18 1.15  --  1.38*  CALCIUM 9.7 8.8* 8.9  --  9.5  GFRNONAA >60 >60 >60  --  >60  GFRAA >60 >60 >60  --   --   PROT 7.4  --   --  7.1 7.1  ALBUMIN 4.2  --   --  3.8 4.1  AST 21  --   --  22 17  ALT 29  --   --  17 22  ALKPHOS 41  --   --  46 52  BILITOT 0.8  --   --  0.4 0.6  BILIDIR  --   --   --  <0.1  --   IBILI  --   --   --  NOT CALCULATED  --    Iron/TIBC/Ferritin/ %Sat    Component Value Date/Time   IRON 64 03/31/2020 1541   TIBC 325 03/31/2020 1541   FERRITIN 50 03/31/2020 1541   IRONPCTSAT 20 03/31/2020 1541      RADIOGRAPHIC STUDIES: I have personally reviewed the radiological images as listed and agreed with the findings in the report. No results found.    ASSESSMENT & PLAN:  1. Malignant neoplasm of colon, unspecified part of colon ([place])    #Stage I colon cancer status post right hemicolectomy. Labs are reviewed and discussed with patient. Anemia has completely resolved. CEA is stable. Recommend patient to repeat colonoscopy 1 year after his surgery. Follow-up with me with labs and history physical examination in 6 months  #Family history of cancer, personal history of colon cancer-age of onset less than 50 I have  referred patient to establish care with genetic counselor.  Patient and his mom declined today.  Orders Placed This Encounter  Procedures  . CBC with  Differential/Platelet    Standing Status:   Future    Standing Expiration Date:   10/01/2021  . Comprehensive metabolic panel    Standing Status:   Future    Standing Expiration Date:   10/01/2021  . CEA    Standing Status:   Future    Standing Expiration Date:   10/01/2021    All questions were answered. The patient knows to call the clinic with any problems questions or concerns.   cc Donnie Coffin, MD    Return of visit: 6 months   Earlie Server, MD, PhD Hematology Oncology [facility] Pager- [phone] 10/01/2020

## 2020-11-19 ENCOUNTER — Ambulatory Visit (INDEPENDENT_AMBULATORY_CARE_PROVIDER_SITE_OTHER): Payer: Medicare Other | Admitting: Surgery

## 2020-11-19 ENCOUNTER — Other Ambulatory Visit: Payer: Self-pay

## 2020-11-19 ENCOUNTER — Ambulatory Visit: Payer: Medicare Other | Admitting: Surgery

## 2020-11-19 ENCOUNTER — Encounter: Payer: Self-pay | Admitting: Surgery

## 2020-11-19 VITALS — BP 126/88 | HR 93 | Temp 98.6°F | Ht 68.75 in | Wt 180.0 lb

## 2020-11-19 DIAGNOSIS — C183 Malignant neoplasm of hepatic flexure: Secondary | ICD-10-CM

## 2020-11-19 DIAGNOSIS — Z85038 Personal history of other malignant neoplasm of large intestine: Secondary | ICD-10-CM | POA: Diagnosis not present

## 2020-11-19 NOTE — Progress Notes (Signed)
11/19/2020  History of Present Illness: Carl [name]. is a 50 y.o. male s/p laparoscopic right colectomy on 03/11/20 for hepatic flexure mass.  Pathology resulted in villous adenoma with invasive component, stage 1.  He is followed by Dr. Tasia Catchings and did not require chemotherapy.  His last CEA was on 09/30/20 which was 1.4.  He reports doing well, without any abdominal pain, nausea, vomiting, or blood in his stools.  Eating well and having good bowel function.  Past Medical History: Past Medical History:  Diagnosis Date  . Abdominal pain   . Constipation   . Decreased visual acuity   . Nevus   . Polyuria   . Recurrent boils    scalp behind ear  . Schizophrenia ([place])   . Seizures ([place])   . Soft tissue mass    left scalp posterior     Past Surgical History: Past Surgical History:  Procedure Laterality Date  . COLONOSCOPY WITH PROPOFOL N/A 02/14/2020   Procedure: COLONOSCOPY WITH PROPOFOL;  Surgeon: Virgel Manifold, MD;  Location: [facility];  Service: Endoscopy;  Laterality: N/A;  . COLONOSCOPY WITH PROPOFOL N/A 02/28/2020   Procedure: COLONOSCOPY WITH PROPOFOL;  Surgeon: Virgel Manifold, MD;  Location: [facility];  Service: Endoscopy;  Laterality: N/A;  . LAPAROSCOPIC RIGHT COLECTOMY Right 03/11/2020   Procedure: LAPAROSCOPIC RIGHT COLECTOMY, possible open;  Surgeon: Olean Ree, MD;  Location: [facility];  Service: General;  Laterality: Right;  . MASS EXCISION Left 02/11/2015   Procedure: EXCISION MASS, left post neck;  Surgeon: Florene Glen, MD;  Location: [place];  Service: General;  Laterality: Left;    Home Medications: Prior to Admission medications   Medication Sig Start Date End Date Taking? Authorizing Provider  benztropine (COGENTIN) 1 MG tablet Take 1 mg by mouth at bedtime.  04/16/19  Yes [provider]  cetirizine (ZYRTEC) 10 MG tablet Take 10 mg by mouth daily as needed for allergies.   Yes [provider]  docusate  sodium (COLACE) 100 MG capsule Take 1 capsule (100 mg total) by mouth daily. Patient taking differently: Take 100 mg by mouth at bedtime. 09/13/17  Yes Pucilowska, Jolanta B, MD  fluPHENAZine (PROLIXIN) 5 MG tablet Take 5 mg by mouth at bedtime. 04/16/20  Yes [provider]  fluticasone (FLONASE) 50 MCG/ACT nasal spray Place 2 sprays into the nose daily as needed for allergies.  01/02/16  Yes [provider]  QUEtiapine (SEROQUEL) 100 MG tablet Take 100 mg by mouth at bedtime.   Yes [provider]  risperidone (RISPERDAL) 4 MG tablet Take 1 tablet by mouth daily. 04/16/20  Yes [provider]  traZODone (DESYREL) 100 MG tablet Take 1 tablet (100 mg total) by mouth at bedtime as needed for sleep. Patient taking differently: Take 100 mg by mouth at bedtime. 09/12/17  Yes Pucilowska, Jolanta B, MD    Allergies: No Known Allergies  Review of Systems: Review of Systems  Constitutional: Negative for chills and fever.  Respiratory: Negative for shortness of breath.   Cardiovascular: Negative for chest pain.  Gastrointestinal: Negative for abdominal pain, blood in stool, constipation, diarrhea, nausea and vomiting.    Physical Exam BP 126/88   Pulse 93   Temp 98.6 F (37 C)   Ht 5' 8.75" (1.746 m)   Wt 180 lb (81.6 kg)   SpO2 97%   BMI 26.78 kg/m  CONSTITUTIONAL: No acute distress, well nourished. HEENT:  Normocephalic, atraumatic, extraocular motion intact. RESPIRATORY:  Lungs are  clear, and breath sounds are equal bilaterally. Normal respiratory effort without pathologic use of accessory muscles. CARDIOVASCULAR: Heart is regular without murmurs, gallops, or rubs. GI: The abdomen is soft, non-distended, non-tender.  Incisions are all well healed, without any evidence of hernia on exam. There were no palpable masses. NEUROLOGIC:  Motor and sensation is grossly normal.  Cranial nerves are grossly intact. PSYCH:  Alert and oriented to person, place and  time. Affect is normal.  Labs/Imaging: CEA on 09/30/20 -- 1.4  Assessment and Plan: This is a 50 y.o. male s/p laparoscopic right colectomy for colon cancer.  --Patient is doing very well and without any complications or concerning issues.  Exam is also reassuring.  His last CEA is normal. --Will send referral for Dr. Bonna Gains who did his original colonoscopy for repeat colonoscopy towards the end of August for the 1 year follow up.  He will follow up with me afterwards (around 4 months) to discuss results and for exam.   Face-to-face time spent with the patient and care providers was 25 minutes, with more than 50% of the time spent counseling, educating, and coordinating care of the patient.     Melvyn Neth, [facility]

## 2020-11-19 NOTE — Patient Instructions (Signed)
A referral to GI has been placed. You will need a colonoscopy the end of August.  If you have worsening symptoms, please call our office.   If you have any concerns or questions, please feel free to call our office. See follow up appointment below.

## 2020-12-03 ENCOUNTER — Ambulatory Visit (INDEPENDENT_AMBULATORY_CARE_PROVIDER_SITE_OTHER): Payer: Medicare Other | Admitting: Gastroenterology

## 2020-12-03 ENCOUNTER — Other Ambulatory Visit: Payer: Self-pay

## 2020-12-03 VITALS — BP 128/89 | HR 91 | Wt 175.0 lb

## 2020-12-03 DIAGNOSIS — Z85038 Personal history of other malignant neoplasm of large intestine: Secondary | ICD-10-CM

## 2020-12-03 DIAGNOSIS — K5900 Constipation, unspecified: Secondary | ICD-10-CM

## 2020-12-03 NOTE — Progress Notes (Signed)
[name], MD [address]  [place]  [place], [place] [address]  Main: [phone]  Fax: [phone]   Primary Care Physician: Donnie Coffin, MD   Chief complaint: Colon cancer  HPI: Carl [name]. is a 50 y.o. male with history of colon cancer here for follow-up.  Patient reports dull abdominal pain, bilateral lower quadrant, 2/10, for the last week.  No nausea or vomiting.  No weight loss.  No blood in stool.  Reports having a bowel movement 3 to 4 days in a week.  Previous history: was found to have large colon polyp on colonoscopy done last year in August 2021.  Pathology showed villous adenoma.  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  27 lymph nodes negative for malignancy.  Patient has been seen by Dr. Tasia Catchings of oncology and no chemotherapy is recommended.  Repeat colonoscopy has been recommended in 1 year.  Current Outpatient Medications  Medication Sig Dispense Refill  . benztropine (COGENTIN) 1 MG tablet Take 1 mg by mouth at bedtime.     . cetirizine (ZYRTEC) 10 MG tablet Take 10 mg by mouth daily as needed for allergies.    Marland Kitchen docusate sodium (COLACE) 100 MG capsule Take 1 capsule (100 mg total) by mouth daily. (Patient taking differently: Take 100 mg by mouth at bedtime.) 30 capsule 1  . fluPHENAZine (PROLIXIN) 5 MG tablet Take 5 mg by mouth at bedtime.    . fluticasone (FLONASE) 50 MCG/ACT nasal spray Place 2 sprays into the nose daily as needed for allergies.     Marland Kitchen QUEtiapine (SEROQUEL) 100 MG tablet Take 100 mg by mouth at bedtime.    . risperidone (RISPERDAL) 4 MG tablet Take 1 tablet by mouth daily.    . traZODone (DESYREL) 100 MG tablet Take 1 tablet (100 mg total) by mouth at bedtime as needed for sleep. (Patient taking differently: Take 100 mg by mouth at bedtime.) 30 tablet 1   No current  facility-administered medications for this visit.    Allergies as of 12/03/2020  . (No Known Allergies)    ROS:  General: Negative for anorexia, weight loss, fever, chills, fatigue, weakness. ENT: Negative for hoarseness, difficulty swallowing , nasal congestion. CV: Negative for chest pain, angina, palpitations, dyspnea on exertion, peripheral edema.  Respiratory: Negative for dyspnea at rest, dyspnea on exertion, cough, sputum, wheezing.  GI: See history of present illness. GU:  Negative for dysuria, hematuria, urinary incontinence, urinary frequency, nocturnal urination.  Endo: Negative for unusual weight change.    Physical Examination:   BP 128/89   Pulse 91   Wt 175 lb (79.4 kg)   BMI 26.03 kg/m   General: Well-nourished, well-developed in no acute distress.  Eyes: No icterus. Conjunctivae pink. Mouth: Oropharyngeal mucosa moist and pink , no lesions erythema or exudate. Neck: Supple, Trachea midline Abdomen: Bowel sounds are normal, nontender, nondistended, no hepatosplenomegaly or masses, no abdominal bruits or hernia , no rebound or guarding.   Extremities: No lower extremity edema. No clubbing or deformities. Neuro: Alert and oriented x 3.  Grossly intact. Skin: Warm and dry, no jaundice.   Psych: Alert and cooperative, normal mood and affect.   Labs: CMP     Component Value Date/Time   NA 137 09/30/2020 1358   K 3.7 09/30/2020 1358   CL 101 09/30/2020 1358   CO2 26 09/30/2020 1358   GLUCOSE 132 (  H) 09/30/2020 1358   BUN 16 09/30/2020 1358   CREATININE 1.38 (H) 09/30/2020 1358   CALCIUM 9.5 09/30/2020 1358   PROT 7.1 09/30/2020 1358   ALBUMIN 4.1 09/30/2020 1358   AST 17 09/30/2020 1358   ALT 22 09/30/2020 1358   ALKPHOS 52 09/30/2020 1358   BILITOT 0.6 09/30/2020 1358   GFRNONAA >60 09/30/2020 1358   GFRAA >60 03/13/2020 0425   Lab Results  Component Value Date   WBC 4.5 09/30/2020   HGB 14.5 09/30/2020   HCT 42.1 09/30/2020   MCV 79.0 (L)  09/30/2020   PLT 246 09/30/2020    Imaging Studies: No results found.  Assessment and Plan:   Carl [name]. is a 50 y.o. y/o male with history of colon cancer here for follow-up  Patient's mild abdominal pain is likely due to constipation High-fiber diet MiraLAX daily with goal of 1-2 soft bowel movements daily.  If not at goal, patient instructed to increase dose to twice daily.  If loose stools with the medication, patient asked to decrease the medication to every other day, or half dose daily.  Patient verbalized understanding  Abdominal pain does not get better with above measures in the next 2 to 3 weeks, or gets worse, patient was advised to give [place] a call back  Patient is also due for repeat colonoscopy in August 2022.  Patient willing to schedule at this time.  I have discussed alternative options, risks & benefits,  which include, but are not limited to, bleeding, infection, perforation,respiratory complication & drug reaction.  The patient agrees with this plan & written consent will be obtained.       Dr [name]

## 2020-12-03 NOTE — Addendum Note (Signed)
Addended by: Lurlean Nanny on: 12/03/2020 04:14 PM   Modules accepted: Orders, SmartSet

## 2021-02-25 ENCOUNTER — Telehealth: Payer: Self-pay

## 2021-02-25 NOTE — Telephone Encounter (Signed)
Called, no answer and no VM... need to reschedule procedure as Dr Bonna Gains is not available, there is availability on Sept 8, 14, 15

## 2021-02-27 NOTE — Telephone Encounter (Signed)
Called, no answer and VM not set up

## 2021-03-02 MED ORDER — NA SULFATE-K SULFATE-MG SULF 17.5-3.13-1.6 GM/177ML PO SOLN: 1.0000 | Freq: Once | ORAL | 0 refills | Status: AC

## 2021-03-02 NOTE — Addendum Note (Signed)
Addended by: Lurlean Nanny on: 03/02/2021 09:23 AM   Modules accepted: Orders

## 2021-03-02 NOTE — Telephone Encounter (Signed)
I spoke to mother and procedure was r/s for 03/18/2021... Rx sent through e-scribe New letter mailed to pt

## 2021-03-18 ENCOUNTER — Ambulatory Visit: Payer: Medicare Other | Admitting: Anesthesiology

## 2021-03-18 ENCOUNTER — Ambulatory Visit
Admission: RE | Admit: 2021-03-18 | Discharge: 2021-03-18 | Disposition: A | Discharge status: Home / Self Care | Payer: Medicare Other | Attending: Gastroenterology | Admitting: Gastroenterology

## 2021-03-18 ENCOUNTER — Encounter
Admission: RE | Disposition: A | Discharge status: Home / Self Care | Payer: Self-pay | Source: Home / Self Care | Attending: Gastroenterology

## 2021-03-18 ENCOUNTER — Encounter: Payer: Self-pay | Admitting: Gastroenterology

## 2021-03-18 DIAGNOSIS — Z85038 Personal history of other malignant neoplasm of large intestine: Secondary | ICD-10-CM | POA: Diagnosis not present

## 2021-03-18 DIAGNOSIS — Z9049 Acquired absence of other specified parts of digestive tract: Secondary | ICD-10-CM | POA: Diagnosis not present

## 2021-03-18 DIAGNOSIS — Z79899 Other long term (current) drug therapy: Secondary | ICD-10-CM | POA: Insufficient documentation

## 2021-03-18 DIAGNOSIS — Z8249 Family history of ischemic heart disease and other diseases of the circulatory system: Secondary | ICD-10-CM | POA: Insufficient documentation

## 2021-03-18 DIAGNOSIS — K635 Polyp of colon: Secondary | ICD-10-CM

## 2021-03-18 DIAGNOSIS — Z833 Family history of diabetes mellitus: Secondary | ICD-10-CM | POA: Diagnosis not present

## 2021-03-18 DIAGNOSIS — Z8349 Family history of other endocrine, nutritional and metabolic diseases: Secondary | ICD-10-CM | POA: Diagnosis not present

## 2021-03-18 DIAGNOSIS — Z980 Intestinal bypass and anastomosis status: Secondary | ICD-10-CM | POA: Diagnosis not present

## 2021-03-18 DIAGNOSIS — Z1211 Encounter for screening for malignant neoplasm of colon: Secondary | ICD-10-CM | POA: Insufficient documentation

## 2021-03-18 DIAGNOSIS — K5900 Constipation, unspecified: Secondary | ICD-10-CM

## 2021-03-18 DIAGNOSIS — Z8269 Family history of other diseases of the musculoskeletal system and connective tissue: Secondary | ICD-10-CM | POA: Insufficient documentation

## 2021-03-18 HISTORY — PX: COLONOSCOPY WITH PROPOFOL: SHX5780

## 2021-03-18 SURGERY — COLONOSCOPY WITH PROPOFOL
Anesthesia: General

## 2021-03-18 MED ORDER — SODIUM CHLORIDE 0.9 % IV SOLN
INTRAVENOUS | Status: DC
  Administered 2021-03-18: 20 mL/h via INTRAVENOUS

## 2021-03-18 MED ORDER — PROPOFOL 500 MG/50ML IV EMUL
INTRAVENOUS | Status: DC | PRN
  Administered 2021-03-18: 120 ug/kg/min via INTRAVENOUS

## 2021-03-18 MED ORDER — PROPOFOL 10 MG/ML IV BOLUS
INTRAVENOUS | Status: DC | PRN
  Administered 2021-03-18: 50 mg via INTRAVENOUS

## 2021-03-18 MED ORDER — LIDOCAINE HCL (CARDIAC) PF 100 MG/5ML IV SOSY
PREFILLED_SYRINGE | INTRAVENOUS | Status: DC | PRN
  Administered 2021-03-18: 50 mg via INTRAVENOUS

## 2021-03-18 MED ORDER — PHENYLEPHRINE HCL (PRESSORS) 10 MG/ML IV SOLN
INTRAVENOUS | Status: DC | PRN
  Administered 2021-03-18 (×3): 50 ug via INTRAVENOUS

## 2021-03-18 NOTE — Transfer of Care (Signed)
Immediate Anesthesia Transfer of Care Note  Patient: Carl [name].  Procedure(s) Performed: COLONOSCOPY WITH PROPOFOL  Patient Location: PACU and Endoscopy Unit  Anesthesia Type:General  Level of Consciousness: drowsy  Airway & Oxygen Therapy: Patient Spontanous Breathing  Post-op Assessment: Report given to RN and Post -op Vital signs reviewed and stable  Post vital signs: Reviewed and stable  Last Vitals:  Vitals Value Taken Time  BP    Temp    Pulse    Resp    SpO2      Last Pain:  Vitals:   03/18/21 0955  TempSrc: Temporal  PainSc: 9          Complications: No notable events documented.

## 2021-03-18 NOTE — Op Note (Signed)
[facility] Patient Name: Carl [name] Procedure Date: 03/18/2021 10:26 AM MRN: [id number] Account #: [id number] Date of Birth: July 18, 1970 Admit Type: Outpatient Age: 50 Room: [facility] ENDO ROOM 3 Gender: Male Note Status: Finalized Instrument Name: Jasper Riling [id number] Procedure:             Colonoscopy Indications:           High risk colon cancer surveillance: Personal history                         of colon cancer Providers:             Somya Jauregui B. Bonna Gains MD, MD Referring MD:          Edmonia Lynch. Aycock MD (Referring MD) Medicines:             Monitored Anesthesia Care Complications:         No immediate complications. Procedure:             Pre-Anesthesia Assessment:                        - Prior to the procedure, a History and Physical was                         performed, and patient medications, allergies and                         sensitivities were reviewed. The patient's tolerance                         of previous anesthesia was reviewed.                        - The risks and benefits of the procedure and the                         sedation options and risks were discussed with the                         patient. All questions were answered and informed                         consent was obtained.                        - Patient identification and proposed procedure were                         verified prior to the procedure by the physician, the                         nurse, the anesthesiologist, the anesthetist and the                         technician. The procedure was verified in the                         pre-procedure area in the procedure room in the  endoscopy suite.                        - Prophylactic Antibiotics: The patient does not                         require prophylactic antibiotics.                        - ASA Grade Assessment: II - A patient with mild                          systemic disease.                        - After reviewing the risks and benefits, the patient                         was deemed in satisfactory condition to undergo the                         procedure.                        - Monitored anesthesia care was determined to be                         medically necessary for this procedure based on review                         of the patient's medical history, medications, and                         prior anesthesia history.                        - The anesthesia plan was to use monitored anesthesia                         care (MAC).                        After obtaining informed consent, the colonoscope was                         passed under direct vision. Throughout the procedure,                         the patient's blood pressure, pulse, and oxygen                         saturations were monitored continuously. The                         Colonoscope was introduced through the anus and                         advanced to the the ileocolonic anastomosis. The                         colonoscopy was performed with ease. The  patient                         tolerated the procedure well. The quality of the bowel                         preparation was fair. Vegetable matter caused clogging                         of the suction channel requiring air and water flushes                         to clear the channel. Findings:      The perianal and digital rectal examinations were normal.      Two sessile polyps were found in the anastomosis. The polyps were 4 to 5       mm in size. These polyps were removed with a jumbo cold forceps.       Resection and retrieval were complete.      A patchy area of mildly nodular mucosa was found at the anastomosis.       Biopsies were taken with a cold forceps for histology.      The exam was otherwise normal throughout the examined colon.      The retroflexed view of the distal rectum and anal  verge was normal and       showed no anal or rectal abnormalities. Impression:            - Preparation of the colon was fair.                        - Two 4 to 5 mm polyps at the anastomosis, removed                         with a jumbo cold forceps. Resected and retrieved.                        - Nodular mucosa at the colonic anastomosis. Biopsied. Recommendation:        - Await pathology results.                        - Repeat colonoscopy date to be determined after                         pending pathology results are reviewed.                        - Continue present medications.                        - Resume previous diet.                        - Discharge patient to home.                        - Patient has a contact number available for                         emergencies. The signs and symptoms of potential  delayed complications were discussed with the patient.                         Return to normal activities tomorrow. Written                         discharge instructions were provided to the patient.                        - The findings and recommendations were discussed with                         the patient.                        - The findings and recommendations were discussed with                         the patient's family.                        - Return to my office as previously scheduled. Procedure Code(s):     --- Professional ---                        [phone], Colonoscopy, flexible; with biopsy, single or                         multiple Diagnosis Code(s):     --- Professional ---                        [phone], Personal history of other malignant neoplasm                         of large intestine                        K63.5, Polyp of colon CPT copyright 2019 American [employer] Association. All rights reserved. The codes documented in this report are preliminary and upon coder review may  be revised to meet current compliance  requirements.  [name], MD Margretta Sidle B. Bonna Gains MD, MD 03/18/2021 11:12:28 AM This report has been signed electronically. Number of Addenda: 0 Note Initiated On: 03/18/2021 10:26 AM Scope Withdrawal Time: 0 hours 19 minutes 39 seconds  Total Procedure Duration: 0 hours 23 minutes 53 seconds  Estimated Blood Loss:  Estimated blood loss: none.      [facility]

## 2021-03-18 NOTE — H&P (Signed)
[name], MD [address] [place], [place], [place], [address] [address], [place], [place], [place], [address] Phone: [phone]  Fax: [phone]  Primary Care Physician:  Donnie Coffin, MD   Pre-Procedure History & Physical: HPI:  Carl [name]. is a 50 y.o. male is here for a colonoscopy.   Past Medical History:  Diagnosis Date   Abdominal pain    Constipation    Decreased visual acuity    Nevus    Polyuria    Recurrent boils    scalp behind ear   Schizophrenia (HCC)    Seizures (HCC)    Soft tissue mass    left scalp posterior    Past Surgical History:  Procedure Laterality Date   COLONOSCOPY WITH PROPOFOL N/A 02/14/2020   Procedure: COLONOSCOPY WITH PROPOFOL;  Surgeon: Virgel Manifold, MD;  Location: [facility] ENDOSCOPY;  Service: Endoscopy;  Laterality: N/A;   COLONOSCOPY WITH PROPOFOL N/A 02/28/2020   Procedure: COLONOSCOPY WITH PROPOFOL;  Surgeon: Virgel Manifold, MD;  Location: [facility] ENDOSCOPY;  Service: Endoscopy;  Laterality: N/A;   LAPAROSCOPIC RIGHT COLECTOMY Right 03/11/2020   Procedure: LAPAROSCOPIC RIGHT COLECTOMY, possible open;  Surgeon: Olean Ree, MD;  Location: [facility] ORS;  Service: General;  Laterality: Right;   MASS EXCISION Left 02/11/2015   Procedure: EXCISION MASS, left post neck;  Surgeon: Florene Glen, MD;  Location: [place];  Service: General;  Laterality: Left;    Prior to Admission medications   Medication Sig Start Date End Date Taking? Authorizing Provider  cetirizine (ZYRTEC) 10 MG tablet Take 10 mg by mouth daily as needed for allergies.   Yes [provider]  docusate sodium (COLACE) 100 MG capsule Take 1 capsule (100 mg total) by mouth daily. Patient taking differently: Take 100 mg by mouth at bedtime. 09/13/17  Yes Pucilowska, Jolanta B, MD  fluPHENAZine (PROLIXIN) 5 MG tablet Take 5 mg by mouth at bedtime. 04/16/20  Yes [provider]  fluticasone (FLONASE) 50 MCG/ACT nasal  spray Place 2 sprays into the nose daily as needed for allergies.  01/02/16  Yes [provider]  QUEtiapine (SEROQUEL) 100 MG tablet Take 100 mg by mouth at bedtime.   Yes [provider]  risperidone (RISPERDAL) 4 MG tablet Take 1 tablet by mouth daily. 04/16/20  Yes [provider]  traZODone (DESYREL) 100 MG tablet Take 1 tablet (100 mg total) by mouth at bedtime as needed for sleep. Patient taking differently: Take 100 mg by mouth at bedtime. 09/12/17  Yes Pucilowska, Jolanta B, MD  benztropine (COGENTIN) 1 MG tablet Take 1 mg by mouth at bedtime.  04/16/19   [provider]    Allergies as of 12/04/2020   (No Known Allergies)    Family History  Problem Relation Age of Onset   Hyperlipidemia Mother    Hypertension Mother    Fibromyalgia Mother    Hypertension Father    Diabetes Father    Hyperlipidemia Father     Social History   Socioeconomic History   Marital status: Single    Spouse name: Not on file   Number of children: Not on file   Years of education: Not on file   Highest education level: Not on file  Occupational History   Not on file  Tobacco Use   Smoking status: Never   Smokeless tobacco: Never  Vaping Use   Vaping Use: Never used  Substance and Sexual Activity   Alcohol use: No  Alcohol/week: 0.0 standard drinks   Drug use: No   Sexual activity: Not on file  Other Topics Concern   Not on file  Social History Narrative   Not on file   Social Determinants of Health   Financial Resource Strain: Not on file  Food Insecurity: Not on file  Transportation Needs: Not on file  Physical Activity: Not on file  Stress: Not on file  Social Connections: Not on file  Intimate Partner Violence: Not on file    Review of Systems: See HPI, otherwise negative ROS  Physical Exam: Constitutional: General:   Alert,  Well-developed, well-nourished, pleasant and cooperative in NAD BP 133/79   Pulse 90   Temp (!) 96.5 F  (35.8 C) (Temporal)   Resp 20   Ht '5\' 9"'$  (1.753 m)   Wt 78.9 kg   SpO2 99%   BMI 25.70 kg/m   Head: Normocephalic, atraumatic.   Eyes:  Sclera clear, no icterus.   Conjunctiva pink.   Mouth:  No deformity or lesions, oropharynx pink & moist.  Neck:  Supple, trachea midline  Respiratory: Normal respiratory effort  Gastrointestinal:  Soft, non-tender and non-distended without masses, hepatosplenomegaly or hernias noted.  No guarding or rebound tenderness.     Cardiac: No clubbing or edema.  No cyanosis. Normal posterior tibial pedal pulses noted.  Lymphatic:  No significant cervical adenopathy.  Psych:  Alert and cooperative. Normal mood and affect.  Musculoskeletal:   Symmetrical without gross deformities. 5/5 Lower extremity strength bilaterally.  Skin: Warm. Intact without significant lesions or rashes. No jaundice.  Neurologic:  Face symmetrical, tongue midline, Normal sensation to touch;  grossly normal neurologically.  Psych:  Alert and oriented x3, Alert and cooperative. Normal mood and affect.  Impression/Plan: Carl [name]. is here for a colonoscopy to be performed for history of colon cancer  Risks, benefits, limitations, and alternatives regarding  colonoscopy have been reviewed with the patient.  Questions have been answered.  All parties agreeable.   Virgel Manifold, MD  03/18/2021, 10:36 AM

## 2021-03-18 NOTE — OR Nursing (Signed)
Surgical Anastomosis reached at 1044.

## 2021-03-18 NOTE — Anesthesia Postprocedure Evaluation (Signed)
Anesthesia Post Note  Patient: Keny Armand.  Procedure(s) Performed: COLONOSCOPY WITH PROPOFOL  Patient location during evaluation: Endoscopy Anesthesia Type: General Level of consciousness: awake and alert Pain management: pain level controlled Vital Signs Assessment: post-procedure vital signs reviewed and stable Respiratory status: spontaneous breathing, nonlabored ventilation, respiratory function stable and patient connected to nasal cannula oxygen Cardiovascular status: blood pressure returned to baseline and stable Postop Assessment: no apparent nausea or vomiting Anesthetic complications: no   No notable events documented.   Last Vitals:  Vitals:   03/18/21 1130 03/18/21 1140  BP: (!) 145/103 (!) 135/97  Pulse: 79 75  Resp: 19 18  Temp:    SpO2: 100% 100%    Last Pain:  Vitals:   03/18/21 0955  TempSrc: Temporal  PainSc: 9                  Precious Haws Jerett Odonohue

## 2021-03-18 NOTE — Anesthesia Preprocedure Evaluation (Signed)
Anesthesia Evaluation  Patient identified by MRN, date of birth, ID band Patient awake    Reviewed: Allergy & Precautions, NPO status , Patient's Chart, lab work & pertinent test results  History of Anesthesia Complications Negative for: history of anesthetic complications  Airway Mallampati: II  TM Distance: >3 FB Neck ROM: full    Dental  (+) Chipped   Pulmonary neg sleep apnea, neg COPD, Not current smoker,    Pulmonary exam normal        Cardiovascular (-) hypertension(-) Past MI and (-) CHF Normal cardiovascular exam(-) dysrhythmias (-) Valvular Problems/Murmurs     Neuro/Psych Seizures -, Well Controlled,  PSYCHIATRIC DISORDERS Bipolar Disorder Schizophrenia    GI/Hepatic Neg liver ROS, neg GERD  ,  Endo/Other  neg diabetes  Renal/GU negative Renal ROS     Musculoskeletal   Abdominal   Peds  Hematology   Anesthesia Other Findings Past Medical History: No date: Abdominal pain No date: Constipation No date: Decreased visual acuity No date: Nevus No date: Polyuria No date: Recurrent boils     Comment:  scalp behind ear No date: Schizophrenia (HCC) No date: Seizures (HCC) No date: Soft tissue mass     Comment:  left scalp posterior   Reproductive/Obstetrics                             Anesthesia Physical  Anesthesia Plan  ASA: 3  Anesthesia Plan: General   Post-op Pain Management:    Induction: Intravenous  PONV Risk Score and Plan: 2 and Propofol infusion and TIVA  Airway Management Planned: Nasal Cannula  Additional Equipment:   Intra-op Plan:   Post-operative Plan:   Informed Consent: I have reviewed the patients History and Physical, chart, labs and discussed the procedure including the risks, benefits and alternatives for the proposed anesthesia with the patient or authorized representative who has indicated his/her understanding and acceptance.     Dental  Advisory Given  Plan Discussed with: Anesthesiologist, CRNA and Surgeon  Anesthesia Plan Comments: (Patient consented for risks of anesthesia including but not limited to:  - adverse reactions to medications - risk of airway placement if required - damage to eyes, teeth, lips or other oral mucosa - nerve damage due to positioning  - sore throat or hoarseness - Damage to heart, brain, nerves, lungs, other parts of body or loss of life  Patient voiced understanding.)        Anesthesia Quick Evaluation

## 2021-03-19 ENCOUNTER — Encounter: Payer: Self-pay | Admitting: Gastroenterology

## 2021-03-19 LAB — SURGICAL PATHOLOGY

## 2021-03-20 ENCOUNTER — Encounter: Payer: Self-pay | Admitting: Gastroenterology

## 2021-03-23 ENCOUNTER — Other Ambulatory Visit: Payer: Self-pay

## 2021-03-23 ENCOUNTER — Encounter: Payer: Self-pay | Admitting: Surgery

## 2021-03-23 ENCOUNTER — Ambulatory Visit (INDEPENDENT_AMBULATORY_CARE_PROVIDER_SITE_OTHER): Payer: Medicare Other | Admitting: Surgery

## 2021-03-23 VITALS — BP 142/81 | HR 112 | Temp 98.9°F | Ht 69.0 in | Wt 172.8 lb

## 2021-03-23 DIAGNOSIS — C183 Malignant neoplasm of hepatic flexure: Secondary | ICD-10-CM

## 2021-03-23 NOTE — Progress Notes (Signed)
03/23/2021  History of Present Illness: Carl Scholze. is a 50 y.o. 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  Overall he's stage I (pT1, pN0).  He is now one year out from surgery.  He had his colonoscopy on 03/18/21 which showed two polyps at the anastomosis which were resected and did not show any high grade dysplasia or malignancy.  He reports he's doing well.  Denies any abdominal pain, nausea, vomiting, constipation, diarrhea, or blood in the stools.    Past Medical History: Past Medical History:  Diagnosis Date   Abdominal pain    Constipation    Decreased visual acuity    Nevus    Polyuria    Recurrent boils    scalp behind ear   Schizophrenia (HCC)    Seizures (HCC)    Soft tissue mass    left scalp posterior     Past Surgical History: Past Surgical History:  Procedure Laterality Date   COLONOSCOPY WITH PROPOFOL N/A 02/14/2020   Procedure: COLONOSCOPY WITH PROPOFOL;  Surgeon: Virgel Manifold, MD;  Location: [facility] ENDOSCOPY;  Service: Endoscopy;  Laterality: N/A;   COLONOSCOPY WITH PROPOFOL N/A 02/28/2020   Procedure: COLONOSCOPY WITH PROPOFOL;  Surgeon: Virgel Manifold, MD;  Location: [facility] END[facility]COPY;  Service: Endoscopy;  Laterality: N/A;   COLONOSCOPY WITH PROPOFOL N/A 03/18/2021   Procedure: COLONOSCOPY WITH PROPOFOL;  Surgeon: Virgel Manifold, MD;  Location: [facility];  Service: Endoscopy;  Laterality: N/A;   LAPAROSCOPIC RIGHT COLECTOMY Right 03/11/2020   Procedure: LAPAROSCOPIC RIGHT COLECTOMY, possible open;  Surgeon: Olean Ree, MD;  Location: [facility] ORS;  Service: General;  Laterality: Right;   MASS EXCISION Left 02/11/2015   Procedure: EXCISION MASS, left post neck;  Surgeon: Florene Glen, MD;  Location: [place];  Service: General;  Laterality: Left;    Home Medications: Prior to Admission medications   Medication Sig Start Date End Date Taking? Authorizing Provider   benztropine (COGENTIN) 1 MG tablet Take 1 mg by mouth at bedtime.  04/16/19  Yes [provider]  cetirizine (ZYRTEC) 10 MG tablet Take 10 mg by mouth daily as needed for allergies.   Yes [provider]  docusate sodium (COLACE) 100 MG capsule Take 1 capsule (100 mg total) by mouth daily. Patient taking differently: Take 100 mg by mouth at bedtime. 09/13/17  Yes Pucilowska, Jolanta B, MD  fluPHENAZine (PROLIXIN) 5 MG tablet Take 5 mg by mouth at bedtime. 04/16/20  Yes [provider]  fluticasone (FLONASE) 50 MCG/ACT nasal spray Place 2 sprays into the nose daily as needed for allergies.  01/02/16  Yes [provider]  QUEtiapine (SEROQUEL) 100 MG tablet Take 100 mg by mouth at bedtime.   Yes [provider]  risperidone (RISPERDAL) 4 MG tablet Take 1 tablet by mouth daily. 04/16/20  Yes [provider]  traZODone (DESYREL) 100 MG tablet Take 1 tablet (100 mg total) by mouth at bedtime as needed for sleep. Patient taking differently: Take 100 mg by mouth at bedtime. 09/12/17  Yes Pucilowska, Jolanta B, MD    Allergies: No Known Allergies  Review of Systems: Review of Systems  Constitutional:  Negative for chills and fever.  Respiratory:  Negative for shortness of breath.   Cardiovascular:  Negative for chest pain.  Gastrointestinal:  Negative for abdominal pain, blood in stool, constipation, diarrhea, nausea and vomiting.  Skin:  Negative for rash.   Physical Exam BP (!) 142/81  Pulse (!) 112   Temp 98.9 F (37.2 C) (Oral)   Ht '5\' 9"'$  (1.753 m)   Wt 172 lb 12.8 oz (78.4 kg)   SpO2 96%   BMI 25.52 kg/m  CONSTITUTIONAL: No acute distress, well nourished. HEENT:  Normocephalic, atraumatic, extraocular motion intact. RESPIRATORY:  Lungs are clear, and breath sounds are equal bilaterally. Normal respiratory effort without pathologic use of accessory muscles. CARDIOVASCULAR: Heart is regular without murmurs, gallops, or rubs. GI:  The abdomen is soft, non-distended, non-tender to palpation.  Incisions are well healed, without any evidence of hernia. There were no palpable masses. NEUROLOGIC:  Motor and sensation is grossly normal.  Cranial nerves are grossly intact. PSYCH:  Alert and oriented to person, place and time. Affect is normal.  Labs/Imaging: Colonoscopy 03/18/21: IMPRESSION: - Preparation of the colon was fair. - Two 4 to 5 mm polyps at the anastomosis, removed with a jumbo cold forceps. Resected and retrieved. - Nodular mucosa at the colonic anastomosis. Biopsied.   Pathology 03/18/21: DIAGNOSIS:  A. COLON POLYPS X2, NEAR ANASTOMOSIS; COLD BIOPSY:  - SINGLE FRAGMENT OF TUBULAR ADENOMA.  - SINGLE FRAGMENT OF ENTERIC MUCOSA WITH PROMINENT LYMPHOID AGGREGATE.  - BACKGROUND BENIGN ENTERIC MUCOSA WITH NORMAL VILLOUS ARCHITECTURE AND NO SIGNIFICANT HISTOPATHOLOGIC CHANGE.  - NEGATIVE FOR HIGH-GRADE DYSPLASIA AND MALIGNANCY.   B. COLON, ANASTOMOTIC SITE; COLD BIOPSY:  - BENIGN COLONIC MUCOSA WITH SUPERFICIAL REACTIVE CHANGES.  - NEGATIVE FOR DYSPLASIA AND MALIGNANCY.   Assessment and Plan: This is a 50 y.o. male s/p laparoscopic right colectomy for hepatic flexure colon cancer, stage I.  --Patient is doing very well and his exam is reassuring.  Colonoscopy results reviewed and although there were two polyps at the anastomosis area, these were benign. --Patient has appointment with Dr. Tasia Catchings next month. --Follow up with me in 6 months.  Face-to-face time spent with the patient and care providers was 25 minutes, with more than 50% of the time spent counseling, educating, and coordinating care of the patient.     Melvyn Neth, [facility]

## 2021-03-23 NOTE — Patient Instructions (Signed)
You have been put on the recall list. Someone will call you in February 2023 to set up appointment for March 2023.  If you have any concerns or questions, please feel free to call our office.

## 2021-04-01 ENCOUNTER — Other Ambulatory Visit: Payer: Self-pay

## 2021-04-01 ENCOUNTER — Inpatient Hospital Stay: Payer: Medicare Other | Attending: Oncology

## 2021-04-01 DIAGNOSIS — C189 Malignant neoplasm of colon, unspecified: Secondary | ICD-10-CM | POA: Diagnosis not present

## 2021-04-01 LAB — CBC WITH DIFFERENTIAL/PLATELET
Abs Immature Granulocytes: 0.01 10*3/uL (ref 0.00–0.07)
Basophils Absolute: 0 10*3/uL (ref 0.0–0.1)
Basophils Relative: 1 %
Eosinophils Absolute: 0 10*3/uL (ref 0.0–0.5)
Eosinophils Relative: 1 %
HCT: 41.6 % (ref 39.0–52.0)
Hemoglobin: 14.4 g/dL (ref 13.0–17.0)
Immature Granulocytes: 0 %
Lymphocytes Relative: 36 %
Lymphs Abs: 2 10*3/uL (ref 0.7–4.0)
MCH: 27.6 pg (ref 26.0–34.0)
MCHC: 34.6 g/dL (ref 30.0–36.0)
MCV: 79.7 fL — ABNORMAL LOW (ref 80.0–100.0)
Monocytes Absolute: 0.5 10*3/uL (ref 0.1–1.0)
Monocytes Relative: 9 %
Neutro Abs: 3 10*3/uL (ref 1.7–7.7)
Neutrophils Relative %: 53 %
Platelets: 277 10*3/uL (ref 150–400)
RBC: 5.22 MIL/uL (ref 4.22–5.81)
RDW: 13.2 % (ref 11.5–15.5)
WBC: 5.6 10*3/uL (ref 4.0–10.5)
nRBC: 0 % (ref 0.0–0.2)

## 2021-04-01 LAB — COMPREHENSIVE METABOLIC PANEL
ALT: 16 U/L (ref 0–44)
AST: 18 U/L (ref 15–41)
Albumin: 4.1 g/dL (ref 3.5–5.0)
Alkaline Phosphatase: 57 U/L (ref 38–126)
Anion gap: 6 (ref 5–15)
BUN: 9 mg/dL (ref 6–20)
CO2: 27 mmol/L (ref 22–32)
Calcium: 9 mg/dL (ref 8.9–10.3)
Chloride: 103 mmol/L (ref 98–111)
Creatinine, Ser: 1.27 mg/dL — ABNORMAL HIGH (ref 0.61–1.24)
GFR, Estimated: >60 mL/min (ref >60)
Glucose, Bld: 99 mg/dL (ref 70–99)
Potassium: 3.8 mmol/L (ref 3.5–5.1)
Sodium: 136 mmol/L (ref 135–145)
Total Bilirubin: 0.6 mg/dL (ref 0.3–1.2)
Total Protein: 7.1 g/dL (ref 6.5–8.1)

## 2021-04-01 NOTE — Progress Notes (Signed)
Survivorship Care Plan visit completed.  Treatment summary reviewed and given to patient.  ASCO answers booklet reviewed and given to patient.  CARE program and Cancer Transitions discussed with patient along with other resources cancer center offers to patients and caregivers.  Patient dad verbalized understanding.

## 2021-04-02 LAB — CEA: CEA: 1.9 ng/mL (ref 0.0–4.7)

## 2021-04-03 ENCOUNTER — Ambulatory Visit: Payer: Medicare Other | Admitting: Oncology

## 2021-04-13 ENCOUNTER — Encounter: Payer: Self-pay | Admitting: Oncology

## 2021-04-13 ENCOUNTER — Inpatient Hospital Stay: Payer: Medicare Other | Attending: Oncology | Admitting: Oncology

## 2021-04-13 VITALS — BP 118/87 | HR 105 | Temp 99.0°F | Resp 16 | Wt 174.0 lb

## 2021-04-13 DIAGNOSIS — C189 Malignant neoplasm of colon, unspecified: Secondary | ICD-10-CM | POA: Diagnosis not present

## 2021-04-13 DIAGNOSIS — Z9049 Acquired absence of other specified parts of digestive tract: Secondary | ICD-10-CM | POA: Insufficient documentation

## 2021-04-13 DIAGNOSIS — Z809 Family history of malignant neoplasm, unspecified: Secondary | ICD-10-CM

## 2021-04-13 DIAGNOSIS — Z85038 Personal history of other malignant neoplasm of large intestine: Secondary | ICD-10-CM | POA: Diagnosis not present

## 2021-04-13 NOTE — Progress Notes (Signed)
Hematology/Oncology follow up note [facility] Telephone:([phone] [phone]   Patient Care Team: Donnie Coffin, MD as PCP - General (Family Medicine) Earlie Server, MD as Consulting Physician (Oncology) Olean Ree, MD as Consulting Physician (General Surgery) Clent Jacks, RN as Oncology Nurse Navigator  REFERRING PROVIDER: Donnie Coffin, MD  CHIEF COMPLAINTS/REASON FOR VISIT:  Follow-up colon cancer  HISTORY OF PRESENTING ILLNESS:   Carl Landini. is a  50 y.o.  male with PMH listed below was seen in consultation at the request of  Donnie Coffin, MD  for evaluation of colon cancer  01/26/2020, CT abdomen pelvis at [facility] showed intussusception of the large bowel at the hepatic flexure with irregular bowel wall thickening. Patient established care with gastroenterology and had colonoscopy on 02/14/2020 which showed 5 to 7 mm polyps in ascending colon and in the cecum.  Resected and retrieved.  5 mm polyp in the ascending colon, resected and retrieved. There is a tumor found at 60 cm proximal to the anus.  Biopsied.  Pathology showed tubular adenoma x2, tubulovillous adenoma x1, 60 cm to anus biopsy showed villous adenoma, definitive high-grade dysplasia is not identified. 02/28/2020, patient had another colonoscopy to place the tatoo and bed placement.  Colonoscopy showed large polyp found in the hepatic flexure.  Polyp was multilobulated.   5 mm polyp in the sigmoid colon, resected and retrieved.  Pathology showed serrated polyp negative for dysplasia and malignancy.  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  All margins were negative for invasive carcinoma.  No lymphovascular invasion or perineural invasion was identified. pT1 pN0.  MLH1,  [place] 2, PMS2 intact, [place] 6 heterogeneous staining.  Negative hypermethylation of the MLH1 gene promoter  Patient referred to oncology to discuss about diagnosis and management plan. Patient is accompanied by his mother. Denies any family history of colon cancer.  Family history is positive for maternal uncle who was diagnosed with prostate cancer.  Maternal great aunt was diagnosed with breast cancer. Patient has no new complaints.  He has history of schizophrenia and seizure.  INTERVAL HISTORY Carl Olivar. is a 50 y.o. male who has above history reviewed by me today presents for follow up visit for management of stage I colon cancer Problems and complaints are listed below: Patient was accompanied by mother.  He is a poor historian. No new complaints. He denies any abdominal pain,bowel habit changes, black or bloody bowel movements.  Weight is stable.   Review of Systems  Constitutional:  Negative for appetite change, chills, fatigue, fever and unexpected weight change.  HENT:   Negative for hearing loss and voice change.   Eyes:  Negative for eye problems and icterus.  Respiratory:  Negative for chest tightness, cough and shortness of breath.   Cardiovascular:  Negative for chest pain and leg swelling.  Gastrointestinal:  Negative for abdominal distention and abdominal pain.  Endocrine: Negative for hot flashes.  Genitourinary:  Negative for difficulty urinating, dysuria and frequency.   Musculoskeletal:  Negative for arthralgias.  Skin:  Negative for itching and rash.  Neurological:  Negative for light-headedness and numbness.  Hematological:  Negative for adenopathy. Does not bruise/bleed easily.  Psychiatric/Behavioral:  Negative for confusion.    MEDICAL HISTORY:  Past Medical History:  Diagnosis Date   Abdominal pain    Constipation  Decreased visual acuity    Nevus    Polyuria    Recurrent boils    scalp behind ear   Schizophrenia (HCC)    Seizures (HCC)     Soft tissue mass    left scalp posterior    SURGICAL HISTORY: Past Surgical History:  Procedure Laterality Date   COLONOSCOPY WITH PROPOFOL N/A 02/14/2020   Procedure: COLONOSCOPY WITH PROPOFOL;  Surgeon: Virgel Manifold, MD;  Location: [facility];  Service: Endoscopy;  Laterality: N/A;   COLONOSCOPY WITH PROPOFOL N/A 02/28/2020   Procedure: COLONOSCOPY WITH PROPOFOL;  Surgeon: Virgel Manifold, MD;  Location: [facility];  Service: Endoscopy;  Laterality: N/A;   COLONOSCOPY WITH PROPOFOL N/A 03/18/2021   Procedure: COLONOSCOPY WITH PROPOFOL;  Surgeon: Virgel Manifold, MD;  Location: [facility];  Service: Endoscopy;  Laterality: N/A;   LAPAROSCOPIC RIGHT COLECTOMY Right 03/11/2020   Procedure: LAPAROSCOPIC RIGHT COLECTOMY, possible open;  Surgeon: Olean Ree, MD;  Location: [facility];  Service: General;  Laterality: Right;   MASS EXCISION Left 02/11/2015   Procedure: EXCISION MASS, left post neck;  Surgeon: Florene Glen, MD;  Location: [facility];  Service: General;  Laterality: Left;    SOCIAL HISTORY: Social History   Socioeconomic History   Marital status: Single    Spouse name: Not on file   Number of children: Not on file   Years of education: Not on file   Highest education level: Not on file  Occupational History   Not on file  Tobacco Use   Smoking status: Never   Smokeless tobacco: Never  Vaping Use   Vaping Use: Never used  Substance and Sexual Activity   Alcohol use: No    Alcohol/week: 0.0 standard drinks   Drug use: No   Sexual activity: Not on file  Other Topics Concern   Not on file  Social History Narrative   Not on file   Social Determinants of Health   Financial Resource Strain: Not on file  Food Insecurity: Not on file  Transportation Needs: Not on file  Physical Activity: Not on file  Stress: Not on file  Social Connections: Not on file  Intimate Partner Violence: Not on file    FAMILY HISTORY: Family  History  Problem Relation Age of Onset   Hyperlipidemia Mother    Hypertension Mother    Fibromyalgia Mother    Hypertension Father    Diabetes Father    Hyperlipidemia Father     ALLERGIES:  has No Known Allergies.  MEDICATIONS:  Current Outpatient Medications  Medication Sig Dispense Refill   benztropine (COGENTIN) 1 MG tablet Take 1 mg by mouth at bedtime.      cetirizine (ZYRTEC) 10 MG tablet Take 10 mg by mouth daily as needed for allergies.     docusate sodium (COLACE) 100 MG capsule Take 1 capsule (100 mg total) by mouth daily. (Patient taking differently: Take 100 mg by mouth at bedtime.) 30 capsule 1   fluPHENAZine (PROLIXIN) 5 MG tablet Take 5 mg by mouth at bedtime.     fluticasone (FLONASE) 50 MCG/ACT nasal spray Place 2 sprays into the nose daily as needed for allergies.      QUEtiapine (SEROQUEL) 100 MG tablet Take 100 mg by mouth at bedtime.     traZODone (DESYREL) 100 MG tablet Take 1 tablet (100 mg total) by mouth at bedtime as needed for sleep. (Patient taking differently: Take 100 mg by mouth at bedtime.) 30 tablet 1   risperidone (  RISPERDAL) 4 MG tablet Take 1 tablet by mouth daily. (Patient not taking: Reported on 04/13/2021)     No current facility-administered medications for this visit.     PHYSICAL EXAMINATION: ECOG PERFORMANCE STATUS: 0 - Asymptomatic Vitals:   04/13/21 1313  BP: 118/87  Pulse: (!) 105  Resp: 16  Temp: 99 F (37.2 C)  SpO2: 97%   Filed Weights   04/13/21 1313  Weight: 174 lb (78.9 kg)    Physical Exam Constitutional:      General: He is not in acute distress. HENT:     Head: Normocephalic and atraumatic.  Eyes:     General: No scleral icterus. Cardiovascular:     Rate and Rhythm: Normal rate and regular rhythm.     Heart sounds: Normal heart sounds.  Pulmonary:     Effort: Pulmonary effort is normal. No respiratory distress.     Breath sounds: No wheezing.  Abdominal:     General: Bowel sounds are normal. There is no  distension.     Palpations: Abdomen is soft.  Musculoskeletal:        General: No deformity. Normal range of motion.     Cervical back: Normal range of motion and neck supple.  Skin:    General: Skin is warm and dry.     Findings: No erythema or rash.  Neurological:     Mental Status: He is alert. Mental status is at baseline.     Cranial Nerves: No cranial nerve deficit.     Coordination: Coordination normal.  Psychiatric:        Mood and Affect: Mood normal.    LABORATORY DATA:  I have reviewed the data as listed Lab Results  Component Value Date   WBC 5.6 04/01/2021   HGB 14.4 04/01/2021   HCT 41.6 04/01/2021   MCV 79.7 (L) 04/01/2021   PLT 277 04/01/2021   Recent Labs    09/30/20 1358 04/01/21 1314  NA 137 136  K 3.7 3.8  CL 101 103  CO2 26 27  GLUCOSE 132* 99  BUN 16 9  CREATININE 1.38* 1.27*  CALCIUM 9.5 9.0  GFRNONAA >60 >60  PROT 7.1 7.1  ALBUMIN 4.1 4.1  AST 17 18  ALT 22 16  ALKPHOS 52 57  BILITOT 0.6 0.6    Iron/TIBC/Ferritin/ %Sat    Component Value Date/Time   IRON 64 03/31/2020 1541   TIBC 325 03/31/2020 1541   FERRITIN 50 03/31/2020 1541   IRONPCTSAT 20 03/31/2020 1541      RADIOGRAPHIC STUDIES: I have personally reviewed the radiological images as listed and agreed with the findings in the report. No results found.    ASSESSMENT & PLAN:  1. Malignant neoplasm of colon, unspecified part of colon ([place])   2. Family history of cancer    #Stage I colon cancer status post right hemicolectomy. Labs are reviewed and discussed with patient. CEA is normal and stable.  He had 1 year post op colonoscopy on 03/18/2021 Small polyps at anastomosis, resected- pathology at tubular adenoma.  Clinically he is doing well.  Recommend follow up in 1 year  #Family history of cancer, personal history of colon cancer-age of onset less than 50 Recommend genetic testing and he agrees. Refer to Dietitian.   Orders Placed This Encounter   Procedures   Ambulatory referral to Genetics    Referral Priority:   Routine    Referral Type:   Consultation    Referral Reason:   Specialty Services  Required    Number of Visits Requested:   1    All questions were answered. The patient knows to call the clinic with any problems questions or concerns.   cc Aycock, Ngwe A, MD    Return of visit: 12 months.   Earlie Server, MD, PhD 04/13/2021

## 2021-04-13 NOTE — Progress Notes (Signed)
Colon Ca follow up. Occ constipation. Denies any blood in stool. No abd pain or nausea.

## 2021-04-27 ENCOUNTER — Telehealth: Payer: Self-pay

## 2021-04-27 NOTE — Telephone Encounter (Signed)
-----   Message from Earlie Server, MD sent at 04/27/2021 12:59 PM EDT ----- Regarding: RE: CANCELLATION Contact: [phone] [place] to cancel. He has schizophrenia. Please FYI his mother so she is aware about this. Thanks.  ----- Message ----- From: Rhoderick Moody, CMA Sent: 04/24/2021   1:41 PM EDT To: Earlie Server, MD Subject: RE: CANCELLATION                               See below ----- Message ----- From: Vanice Sarah, CMA Sent: 04/24/2021   1:22 PM EDT To: Rhoderick Moody, CMA Subject: FW: CANCELLATION                               Forwarding to Bodi Palmeri, the new CMA on [facility] team. ----- Message ----- From: Secundino Ginger Sent: 04/24/2021   1:19 PM EDT To: Vanice Sarah, CMA, Earlie Server, MD, # Subject: CANCELLATION                                   THIS PATIENT CALLED AND GOT OUR ANSWERING SERVICE. HE WANTS TO CANCEL HIS GENETIC COUNSELING REFERRAL ON 10/25 @ 1 PM. I DID NOT WANT TO CANCEL UNTIL I TOLD YOU ALL. I TRIED TO CALL HIM BUT NO ONE ANSWERED.

## 2021-04-27 NOTE — Telephone Encounter (Signed)
Per Dr. Tasia Catchings ok to cancel patients genetic counseling on 10/25. Please cancel....thanks

## 2021-04-28 ENCOUNTER — Inpatient Hospital Stay: Payer: Medicare Other

## 2021-04-28 ENCOUNTER — Inpatient Hospital Stay: Payer: Medicare Other | Admitting: Licensed Clinical Social Worker

## 2021-06-04 ENCOUNTER — Ambulatory Visit (INDEPENDENT_AMBULATORY_CARE_PROVIDER_SITE_OTHER): Payer: Medicare Other | Admitting: Gastroenterology

## 2021-06-04 ENCOUNTER — Ambulatory Visit: Payer: Medicare Other | Admitting: Gastroenterology

## 2021-06-04 ENCOUNTER — Encounter: Payer: Self-pay | Admitting: Gastroenterology

## 2021-06-04 VITALS — BP 108/77 | HR 80 | Temp 98.4°F | Wt 175.8 lb

## 2021-06-04 DIAGNOSIS — C182 Malignant neoplasm of ascending colon: Secondary | ICD-10-CM

## 2021-06-04 NOTE — Progress Notes (Signed)
[name], MD [address]  [place]  [place], [place] [address]  Main: [phone]  Fax: [phone]   Primary Care Physician: Donnie Coffin, MD   Chief complaint: Colon cancer follow-up  HPI: Carl [name]. is a 50 y.o. male with history of colon cancer here for follow-up. The patient denies abdominal or flank pain, anorexia, nausea or vomiting, dysphagia, change in bowel habits or black or bloody stools or weight loss.  Underwent colonoscopy on 03/18/2021. Impression:            - Preparation of the colon was fair.                        - Two 4 to 5 mm polyps at the anastomosis, removed                         with a jumbo cold forceps. Resected and retrieved.                        - Nodular mucosa at the colonic anastomosis. Biopsied.  DIAGNOSIS:  A. COLON POLYPS X2, NEAR ANASTOMOSIS; COLD BIOPSY:  - SINGLE FRAGMENT OF TUBULAR ADENOMA.  - SINGLE FRAGMENT OF ENTERIC MUCOSA WITH PROMINENT LYMPHOID AGGREGATE.  - BACKGROUND BENIGN ENTERIC MUCOSA WITH NORMAL VILLOUS ARCHITECTURE AND  NO SIGNIFICANT HISTOPATHOLOGIC CHANGE.  - NEGATIVE FOR HIGH-GRADE DYSPLASIA AND MALIGNANCY.   B. COLON, ANASTOMOTIC SITE; COLD BIOPSY:  - BENIGN COLONIC MUCOSA WITH SUPERFICIAL REACTIVE CHANGES.  - NEGATIVE FOR DYSPLASIA AND MALIGNANCY.   Repeat colonoscopy recommended in 1 year with 2-day prep  Following with Dr. Tasia Catchings of hematology  Previous history: was found to have large colon polyp on colonoscopy done last year in August 2021.  Pathology showed villous adenoma.  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  27 lymph nodes negative for malignancy.  Patient has been seen by Dr. Tasia Catchings of oncology and no chemotherapy is recommended.    ROS: All ROS reviewed and negative except as per HPI   Past Medical History:  Diagnosis Date   Abdominal  pain    Constipation    Decreased visual acuity    Nevus    Polyuria    Recurrent boils    scalp behind ear   Schizophrenia (HCC)    Seizures (HCC)    Soft tissue mass    left scalp posterior    Past Surgical History:  Procedure Laterality Date   COLONOSCOPY WITH PROPOFOL N/A 02/14/2020   Procedure: COLONOSCOPY WITH PROPOFOL;  Surgeon: Virgel Manifold, MD;  Location: [facility];  Service: Endoscopy;  Laterality: N/A;   COLONOSCOPY WITH PROPOFOL N/A 02/28/2020   Procedure: COLONOSCOPY WITH PROPOFOL;  Surgeon: Virgel Manifold, MD;  Location: [facility];  Service: Endoscopy;  Laterality: N/A;   COLONOSCOPY WITH PROPOFOL N/A 03/18/2021   Procedure: COLONOSCOPY WITH PROPOFOL;  Surgeon: Virgel Manifold, MD;  Location: [facility];  Service: Endoscopy;  Laterality: N/A;   LAPAROSCOPIC RIGHT COLECTOMY Right 03/11/2020   Procedure: LAPAROSCOPIC RIGHT COLECTOMY, possible open;  Surgeon: Olean Ree, MD;  Location: [facility];  Service: General;  Laterality: Right;   MASS EXCISION Left 02/11/2015   Procedure: EXCISION MASS, left post neck;  Surgeon: Florene Glen, MD;  Location: [place];  Service: General;  Laterality: Left;  Prior to Admission medications   Medication Sig Start Date End Date Taking? Authorizing Provider  benztropine (COGENTIN) 1 MG tablet Take 1 mg by mouth at bedtime.  04/16/19  Yes [provider]  cetirizine (ZYRTEC) 10 MG tablet Take 10 mg by mouth daily as needed for allergies.   Yes [provider]  docusate sodium (COLACE) 100 MG capsule Take 1 capsule (100 mg total) by mouth daily. Patient taking differently: Take 100 mg by mouth at bedtime. 09/13/17  Yes Pucilowska, Jolanta B, MD  fluPHENAZine (PROLIXIN) 5 MG tablet Take 5 mg by mouth at bedtime. 04/16/20  Yes [provider]  fluticasone (FLONASE) 50 MCG/ACT nasal spray Place 2 sprays into the nose daily as needed for allergies.  01/02/16  Yes [provider]  QUEtiapine (SEROQUEL) 100 MG tablet Take 100 mg by mouth at bedtime.   Yes [provider]  risperidone (RISPERDAL) 4 MG tablet Take 1 tablet by mouth daily. 04/16/20  Yes [provider]  traZODone (DESYREL) 100 MG tablet Take 1 tablet (100 mg total) by mouth at bedtime as needed for sleep. Patient taking differently: Take 100 mg by mouth at bedtime. 09/12/17  Yes Pucilowska, Wardell Honour, MD    Family History  Problem Relation Age of Onset   Hyperlipidemia Mother    Hypertension Mother    Fibromyalgia Mother    Hypertension Father    Diabetes Father    Hyperlipidemia Father      Social History   Tobacco Use   Smoking status: Never   Smokeless tobacco: Never  Vaping Use   Vaping Use: Never used  Substance Use Topics   Alcohol use: No    Alcohol/week: 0.0 standard drinks   Drug use: No    Allergies as of 06/04/2021   (No Known Allergies)    Physical Examination:  Constitutional: General:   Alert,  Well-developed, well-nourished, pleasant and cooperative in NAD BP 108/77   Pulse 80   Temp 98.4 F (36.9 C) (Oral)   Wt 175 lb 12.8 oz (79.7 kg)   BMI 25.96 kg/m   Respiratory: Normal respiratory effort  Gastrointestinal:  Soft, non-tender and non-distended without masses, hepatosplenomegaly or hernias noted.  No guarding or rebound tenderness.     Cardiac: No clubbing or edema.  No cyanosis. Normal posterior tibial pedal pulses noted.  Psych:  Alert and cooperative. Normal mood and affect.  Musculoskeletal:  Normal gait. Head normocephalic, atraumatic. Symmetrical without gross deformities. 5/5 Lower extremity strength bilaterally.  Skin: Warm. Intact without significant lesions or rashes. No jaundice.  Neck: Supple, trachea midline  Lymph: No cervical lymphadenopathy  Psych:  Alert and oriented x3, Alert and cooperative. Normal mood and affect.  Labs: CMP     Component Value Date/Time   NA 136 04/01/2021 1314   K 3.8  04/01/2021 1314   CL 103 04/01/2021 1314   CO2 27 04/01/2021 1314   GLUCOSE 99 04/01/2021 1314   BUN 9 04/01/2021 1314   CREATININE 1.27 (H) 04/01/2021 1314   CALCIUM 9.0 04/01/2021 1314   PROT 7.1 04/01/2021 1314   ALBUMIN 4.1 04/01/2021 1314   AST 18 04/01/2021 1314   ALT 16 04/01/2021 1314   ALKPHOS 57 04/01/2021 1314   BILITOT 0.6 04/01/2021 1314   GFRNONAA >60 04/01/2021 1314   GFRAA >60 03/13/2020 0425   Lab Results  Component Value Date   WBC 5.6 04/01/2021   HGB 14.4 04/01/2021   HCT 41.6 04/01/2021   MCV 79.7 (L)  04/01/2021   PLT 277 04/01/2021    Imaging Studies:   Assessment and Plan:   Carl [name]. is a 50 y.o. y/o male found to have a large colon polyp and is status post right hemicolectomy with pathology showing invasive adenocarcinoma, with most recent colonoscopy completed in September 2022  Patient is doing well and is currently asymptomatic from GI standpoint  Repeat colonoscopy in 1 year with 2-day prep and this was discussed with patient and family in detail  Colonoscopy recall set for 1 year and clinic recall set around the same time as well  If any new symptoms occur before then patient advised to call us and him and his family verbalized understanding  Continue follow up with hematology   Dr [name]

## 2021-08-21 ENCOUNTER — Other Ambulatory Visit: Payer: Self-pay

## 2021-08-21 ENCOUNTER — Ambulatory Visit (INDEPENDENT_AMBULATORY_CARE_PROVIDER_SITE_OTHER): Payer: Medicare Other | Admitting: Psychiatry

## 2021-08-21 ENCOUNTER — Encounter: Payer: Self-pay | Admitting: Psychiatry

## 2021-08-21 VITALS — BP 121/83 | HR 82 | Temp 96.6°F | Ht 69.0 in | Wt 172.6 lb

## 2021-08-21 DIAGNOSIS — Z79899 Other long term (current) drug therapy: Secondary | ICD-10-CM

## 2021-08-21 DIAGNOSIS — Z9189 Other specified personal risk factors, not elsewhere classified: Secondary | ICD-10-CM

## 2021-08-21 DIAGNOSIS — F209 Schizophrenia, unspecified: Secondary | ICD-10-CM | POA: Insufficient documentation

## 2021-08-21 NOTE — Patient Instructions (Addendum)
Please call [phone] for EKG.  Please request medical records from [facility]

## 2021-08-21 NOTE — Progress Notes (Signed)
Psychiatric Initial Adult Assessment   Patient Identification: Carl [name]. MRN:  [id number] Date of Evaluation:  08/21/2021 Referral Source:  Dr. Tomasa Hose Chief Complaint:   Chief Complaint  Patient presents with   Establish Care 51 year old African-American male, with history of schizophrenia on disability, single, presented to establish care.   Visit Diagnosis:    ICD-10-CM   1. Schizophrenia, unspecified type ([place])  F20.9 Lipid panel    Hemoglobin A1C    Prolactin    Hepatic function panel    BUN+Creat    Sodium    EKG 12-Lead    TSH    2. High risk medication use  Z79.899 Lipid panel    Hemoglobin A1C    Prolactin    Hepatic function panel    BUN+Creat    Sodium    EKG 12-Lead    TSH    3. At risk for prolonged QT interval syndrome  Z91.89 EKG 12-Lead      History of Present Illness:  Carl Randallis a 51 year old African-American male, history of schizophrenia, on multiple psychotropic medications, presents with his mother " [name]', to establish care.  Patient being a limited historian majority of information was provided by mother who participated in the session.  According to mother patient was first diagnosed with schizophrenia at the age of 46.  Patient at that time had trouble at school and was having auditory hallucinations.  Patient soon after that got admitted to mental health hospital several times.  He was admitted at [facility], [facility], [facility] and so on.  Patient was under the care of psychiatrist at [facility] for several years.  Patient was tried on multiple psychotropic medications including Clozaril.  Patient however had colon cancer and could not tolerate the Clozaril and was taken off of it.  He had withdrawal symptoms and psychosis when he got off of the Clozaril.  According to mother he developed hot flashes and ran out into the street naked when he was taken off of it.  Patient later on was placed on medications like Seroquel, Prolixin,  risperidone.  Most recently his Prolixin dosage was increased by his primary care provider to a 10 mg a few weeks ago.  This change was likely made since he was still having auditory hallucinations which were distressing to him.  According to mother on this current combination of medication he is doing fairly well.  Although he has chronic auditory hallucinations, he is coping better.  Patient denies any anxiety symptoms however mother seems to believe he does have mild anxiety on and off.  Patient also appears to be socially anxious.  Patient minimally verbal.  Patient denies any sleep problems on the current combination of medications.  Denies any suicidality or homicidality.  Denies any sadness or low motivation.  Reports he spends his time watching movies as well as sports.  He tries to stay active.  Does report a history of trauma-according to mother he was bullied in school prior to his first episode of psychosis.  Unknown if this does have an impact on him at this time or not.     Associated Signs/Symptoms: Depression Symptoms:   denies (Hypo) Manic Symptoms:   denies Anxiety Symptoms:   denies Psychotic Symptoms:  Hallucinations: Auditory PTSD Symptoms: Had a traumatic exposure:  as noted above  Past Psychiatric History: Patient with past history of schizophrenia unspecified type.  Patient with multiple inpatient mental health [place] at the age of 34, [place],  [place].  Most recent admission was in October 2020 in [place].  Previous Psychotropic Medications: Yes Haldol-caused him to have seizures, Clozaril-could not tolerate, Seroquel, Prolixin, risperidone, zyprexa.  Will need to get medical records for further information.  Substance Abuse History in the last 12 months:  No.  Consequences of Substance Abuse: Negative  Past Medical History:  Past Medical History:  Diagnosis Date   Abdominal pain    Anxiety    Constipation    Decreased visual  acuity    Nevus    Polyuria    Recurrent boils    scalp behind ear   Schizophrenia (HCC)    Seizures ([place]) 08/15/1990   due to haldol   Soft tissue mass    left scalp posterior    Past Surgical History:  Procedure Laterality Date   COLONOSCOPY WITH PROPOFOL N/A 02/14/2020   Procedure: COLONOSCOPY WITH PROPOFOL;  Surgeon: Virgel Manifold, MD;  Location: [facility] ENDOSCOPY;  Service: Endoscopy;  Laterality: N/A;   COLONOSCOPY WITH PROPOFOL N/A 02/28/2020   Procedure: COLONOSCOPY WITH PROPOFOL;  Surgeon: Virgel Manifold, MD;  Location: [facility] ENDOSCOPY;  Service: Endoscopy;  Laterality: N/A;   COLONOSCOPY WITH PROPOFOL N/A 03/18/2021   Procedure: COLONOSCOPY WITH PROPOFOL;  Surgeon: Virgel Manifold, MD;  Location: [facility] ENDOSCOPY;  Service: Endoscopy;  Laterality: N/A;   LAPAROSCOPIC RIGHT COLECTOMY Right 03/11/2020   Procedure: LAPAROSCOPIC RIGHT COLECTOMY, possible open;  Surgeon: Olean Ree, MD;  Location: [facility] ORS;  Service: General;  Laterality: Right;   MASS EXCISION Left 02/11/2015   Procedure: EXCISION MASS, left post neck;  Surgeon: Florene Glen, MD;  Location: [facility];  Service: General;  Laterality: Left;    Family Psychiatric History: As noted below.  Family History:  Family History  Problem Relation Age of Onset   Hyperlipidemia Mother    Hypertension Mother    Fibromyalgia Mother    Hypertension Father    Diabetes Father    Hyperlipidemia Father    Mental illness Neg Hx     Social History:   Social History   Socioeconomic History   Marital status: Single    Spouse name: Not on file   Number of children: Not on file   Years of education: Not on file   Highest education level: 12th grade  Occupational History   Occupation: on disability  Tobacco Use   Smoking status: Never   Smokeless tobacco: Never  Vaping Use   Vaping Use: Never used  Substance and Sexual Activity   Alcohol use: No    Alcohol/week: 0.0 standard drinks   Drug  use: No   Sexual activity: Not Currently  Other Topics Concern   Not on file  Social History Narrative   Not on file   Social Determinants of Health   Financial Resource Strain: Not on file  Food Insecurity: Not on file  Transportation Needs: Not on file  Physical Activity: Not on file  Stress: Not on file  Social Connections: Not on file    Additional Social History: Patient currently lives in [place] with his parents.  Patient graduated high school.  Currently on disability.  Has worked in the past at [employer].  Patient is single.  Has good support system from his parents.  He has 2 brothers.  Denies legal problems.  Allergies:  No Known Allergies  Metabolic Disorder Labs: Lab Results  Component Value Date   HGBA1C 5.2 09/06/2017   MPG 102.54 09/06/2017   No results  found for: PROLACTIN Lab Results  Component Value Date   CHOL 172 09/06/2017   TRIG 108 09/06/2017   HDL 48 09/06/2017   CHOLHDL 3.6 09/06/2017   VLDL 22 09/06/2017   LDLCALC 102 (H) 09/06/2017   Lab Results  Component Value Date   TSH 1.247 09/06/2017    Therapeutic Level Labs: No results found for: LITHIUM No results found for: CBMZ Lab Results  Component Value Date   VALPROATE 97 04/28/2019    Current Medications: Current Outpatient Medications  Medication Sig Dispense Refill   benztropine (COGENTIN) 1 MG tablet Take 1 mg by mouth at bedtime.      cetirizine (ZYRTEC) 10 MG tablet Take 10 mg by mouth daily as needed for allergies.     docusate sodium (COLACE) 100 MG capsule Take 1 capsule (100 mg total) by mouth daily. (Patient taking differently: Take 100 mg by mouth at bedtime.) 30 capsule 1   famotidine (PEPCID) 20 MG tablet Take 20 mg by mouth 2 (two) times daily.     fluPHENAZine (PROLIXIN) 5 MG tablet Take 10 mg by mouth at bedtime.     fluticasone (FLONASE) 50 MCG/ACT nasal spray Place 2 sprays into the nose daily as needed for allergies.      QUEtiapine Fumarate (SEROQUEL XR) 150  MG 24 hr tablet Take 150 mg by mouth at bedtime.     risperidone (RISPERDAL) 4 MG tablet Take 1 tablet by mouth daily.     traZODone (DESYREL) 100 MG tablet Take 1 tablet (100 mg total) by mouth at bedtime as needed for sleep. (Patient taking differently: Take 100 mg by mouth at bedtime.) 30 tablet 1   No current facility-administered medications for this visit.    Musculoskeletal: Strength & Muscle Tone: within normal limits Gait & Station: normal Patient leans: N/A  Psychiatric Specialty Exam: Review of Systems  Psychiatric/Behavioral:  Positive for hallucinations. Negative for decreased concentration, self-injury, sleep disturbance and suicidal ideas. The patient is not hyperactive.   All other systems reviewed and are negative.  Blood pressure 121/83, pulse 82, temperature (!) 96.6 F (35.9 C), height 5\' 9"  (1.753 m), weight 172 lb 9.6 oz (78.3 kg).Body mass index is 25.49 kg/m.  General Appearance: Casual  Eye Contact:  Good  Speech:  Normal Rate  Volume:  Normal  Mood:  Euthymic  Affect:  Labile  Thought Process:  Linear and Descriptions of Associations: Intact  Orientation:  Full (Time, Place, and Person)  Thought Content:  Hallucinations: Auditory chronic, reports unable to make out what they say, last time he heard voices was 2 days ago.  However in session today patient appeared to be labile, smiling inappropriately on and off.  Although redirectable.  Suicidal Thoughts:  No  Homicidal Thoughts:  No  Memory:  Immediate;   Fair Recent;   Fair Remote;   limited  Judgement:  Fair  Insight:  Shallow  Psychomotor Activity:  Normal  Concentration:  Concentration: Poor and Attention Span: Poor  Recall:  [employer] of Knowledge:Poor  Language: Fair  Akathisia:  No  Handed:  Right  AIMS (if indicated):  done  Assets:  Communication Skills Housing Social Support  ADL's:  Intact  Cognition: limited  Sleep:  Fair   Screenings: AIMS    Flowsheet Row Admission  (Discharged) from 09/05/2017 in [place] Total Score 0      AUDIT    Flowsheet Row Admission (Discharged) from 09/05/2017 in [place]  Alcohol Use Disorder Identification Test Final Score (AUDIT) 0      GAD-7    Flowsheet Row Office Visit from 08/21/2021 in [place]  Total GAD-7 Score 0      PHQ2-9    [employer] Office Visit from 08/21/2021 in [place]  PHQ-2 Total Score 0  PHQ-9 Total Score 0      [place] Office Visit from 08/21/2021 in [place] Admission (Discharged) from 03/18/2021 in [facility] [facility]  C-SSRS RISK CATEGORY No Risk No Risk       Assessment and Plan: Jakarius Flamenco Randallis a 51 year old African-American male, on disability, has a history of schizophrenia, on multiple antipsychotic medications, with chronic auditory hallucinations, failed trials of Clozaril ( developed side effects), presented to establish care.  Patient being a limited historian his mother provided collateral information.  Discussed plan as noted below. The patient demonstrates the following risk factors for suicide: Chronic risk factors for suicide include: psychiatric disorder of schizophrenia . Acute risk factors for suicide include: N/A. Protective factors for this patient include: positive social support and coping skills. Considering these factors, the overall suicide risk at this point appears to be low. Patient is appropriate for outpatient follow up.   Plan  Schizophrenia unspecified type-unstable Advised to take benztropine 1 mg p.o. daily as needed instead of daily.  Discussed side effects including constipation blurry vision, memory changes. Continue trazodone 100 mg p.o. nightly as needed Continue Prolixin 10 mg p.o. daily Continue risperidone 4 mg p.o. nightly Continue Seroquel XR 150 mg p.o. daily  at bedtime Patient currently on 3 antipsychotic medications, discussed the risk of being on multiple antipsychotic medications.  Long-term plan is to taper patient down to monotherapy. Patient however failed multiple psychotropic medications in the past including Zyprexa, Clozaril.  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 Patient as well as mother advised to sign ROI to obtain medical records from previous psychiatrist. Once I review the records will be able to make medication changes as needed.  This was discussed with patient as well as mother who was present in session.  Discussed going to emergency department if his hallucinations gets worse since patient appeared to be labile in session today.  High risk medication use-will order TSH, sodium level, prolactin, lipid panel, hepatic function panel, hemoglobin A1c, BUN/creatinine-provided lab slip.  At risk for prolonged QT syndrome-we will order EKG.  Provided #[id number] call to make an appointment since patient is on multiple antipsychotics at this time.  Collateral information was obtained from mother.  As noted above.    Collaboration of Care: Referral or follow-up with counselor/therapist A[facility] will consider referral for psychotherapy sessions, however will need to request medical records for review. Patient with history of seizures likely secondary to being on medication-Haldol, last seizure was at the age of 19 years.  We will need to review medical records, will consider referral for neurology consultation as needed.  Patient/Guardian was advised Release of Information must be obtained prior to any record release in order to collaborate their care with an outside provider. Patient/Guardian was advised if they have not already done so to contact the registration department to sign all necessary forms in order for [place] to release information regarding their care.   Consent: Patient/Guardian gives verbal consent for treatment and  assignment of benefits for services provided during this visit. Patient/Guardian expressed understanding and agreed to proceed.   Follow-up in clinic in 2 weeks or sooner  if needed.  This note was generated in part or whole with voice recognition software. Voice recognition is usually quite accurate but there are transcription errors that can and very often do occur. I apologize for any typographical errors that were not detected and corrected.     Ursula Alert, MD 2/17/20231:57 PM

## 2021-08-24 ENCOUNTER — Other Ambulatory Visit
Admission: RE | Admit: 2021-08-24 | Discharge: 2021-08-24 | Disposition: A | Discharge status: Home / Self Care | Payer: Medicare Other | Attending: Psychiatry | Admitting: Psychiatry

## 2021-08-24 DIAGNOSIS — Z79899 Other long term (current) drug therapy: Secondary | ICD-10-CM | POA: Diagnosis not present

## 2021-08-24 DIAGNOSIS — F209 Schizophrenia, unspecified: Secondary | ICD-10-CM | POA: Insufficient documentation

## 2021-08-24 LAB — HEPATIC FUNCTION PANEL
ALT: 15 U/L (ref 0–44)
AST: 13 U/L — ABNORMAL LOW (ref 15–41)
Albumin: 4 g/dL (ref 3.5–5.0)
Alkaline Phosphatase: 47 U/L (ref 38–126)
Bilirubin, Direct: <0.1 mg/dL (ref 0.0–0.2)
Total Bilirubin: 0.7 mg/dL (ref 0.3–1.2)
Total Protein: 7 g/dL (ref 6.5–8.1)

## 2021-08-24 LAB — BUN: BUN: 16 mg/dL (ref 6–20)

## 2021-08-24 LAB — LIPID PANEL
Cholesterol: 214 mg/dL — ABNORMAL HIGH (ref 0–200)
HDL: 44 mg/dL (ref >40)
LDL Cholesterol: 149 mg/dL — ABNORMAL HIGH (ref 0–99)
Total CHOL/HDL Ratio: 4.9 RATIO
Triglycerides: 103 mg/dL (ref <150)
VLDL: 21 mg/dL (ref 0–40)

## 2021-08-24 LAB — SODIUM: Sodium: 137 mmol/L (ref 135–145)

## 2021-08-24 LAB — HEMOGLOBIN A1C
Hgb A1c MFr Bld: 5.5 % (ref 4.8–5.6)
Mean Plasma Glucose: 111 mg/dL

## 2021-08-24 LAB — TSH: TSH: 1.047 u[IU]/mL (ref 0.350–4.500)

## 2021-08-24 LAB — CREATININE, SERUM
Creatinine, Ser: 1.21 mg/dL (ref 0.61–1.24)
GFR, Estimated: >60 mL/min (ref >60)

## 2021-08-25 LAB — PROLACTIN: Prolactin: 41.2 ng/mL — ABNORMAL HIGH (ref 4.0–15.2)

## 2021-09-09 ENCOUNTER — Ambulatory Visit (INDEPENDENT_AMBULATORY_CARE_PROVIDER_SITE_OTHER): Payer: Medicare Other | Admitting: Psychiatry

## 2021-09-09 ENCOUNTER — Encounter: Payer: Self-pay | Admitting: Psychiatry

## 2021-09-09 ENCOUNTER — Ambulatory Visit
Admission: RE | Admit: 2021-09-09 | Discharge: 2021-09-09 | Disposition: A | Discharge status: Home / Self Care | Payer: Medicare Other | Source: Ambulatory Visit | Attending: Psychiatry | Admitting: Psychiatry

## 2021-09-09 ENCOUNTER — Other Ambulatory Visit: Payer: Self-pay

## 2021-09-09 VITALS — BP 116/81 | HR 93 | Temp 98.7°F | Wt 170.0 lb

## 2021-09-09 DIAGNOSIS — F209 Schizophrenia, unspecified: Secondary | ICD-10-CM | POA: Diagnosis not present

## 2021-09-09 DIAGNOSIS — Z9189 Other specified personal risk factors, not elsewhere classified: Secondary | ICD-10-CM

## 2021-09-09 DIAGNOSIS — Z79899 Other long term (current) drug therapy: Secondary | ICD-10-CM | POA: Diagnosis not present

## 2021-09-09 MED ORDER — RISPERIDONE 2 MG PO TABS: 1.0000 mg | ORAL_TABLET | Freq: Two times a day (BID) | ORAL | 1 refills | Status: DC

## 2021-09-09 MED ORDER — BENZTROPINE MESYLATE 1 MG PO TABS: 1.0000 mg | ORAL_TABLET | Freq: Every day | ORAL | 1 refills | Status: DC

## 2021-09-09 MED ORDER — TRAZODONE HCL 100 MG PO TABS: 100.0000 mg | ORAL_TABLET | Freq: Every day | ORAL | 1 refills | Status: DC

## 2021-09-09 MED ORDER — FLUPHENAZINE HCL 10 MG PO TABS: 10.0000 mg | ORAL_TABLET | Freq: Every day | ORAL | 1 refills | Status: DC

## 2021-09-09 MED ORDER — QUETIAPINE FUMARATE ER 200 MG PO TB24: 200.0000 mg | ORAL_TABLET | Freq: Every day | ORAL | 1 refills | Status: DC

## 2021-09-09 NOTE — Progress Notes (Signed)
[facility] MD OP Progress Note  09/09/2021 12:54 PM Carl [name].  MRN:  [id number]  Chief Complaint:  Chief Complaint  Patient presents with   Follow-up: 51 year old African-American male with history of schizophrenia on disability, presented for medication management.   HPI: Carl Randallis a 51 year old African-American male, single, lives in [place], history of schizophrenia, on multiple psychotropic medications, presented with his mother " Carl [name]" for a follow-up.  Patient being a limited historian majority of information obtained from mother " Carl [name] '.  Patient responded to questions in short phrases.  Patient denied any concerns with medications.  Patient denied any perceptual disturbances.  However in session today was appeared to be labile, smiling to himself at times, likely responding to internal stimuli.  Patient however was alert, oriented to person place time situation and was pleasant in session today.  Patient denied any side effects to any of the medications.  Denied any sadness or anxiety symptoms.  Reported he enjoys watching TV, able to concentrate.  Patient denies any suicidality or homicidality.  According to mom patient likely continues to hear voices although he has had no acting out episodes, or agitation recently.  His auditory hallucinations are chronic.  While in session writer contacted his pharmacy at [place] to staff-Jennifer-verified all his medication dosages.  As per staff patient likely ran out of all his medications except for the trazodone.  However when this was discussed with mother, she denied it.  Mother claims patient continues to take all his medications regularly however is about to run out.  Since patient is on 3 antipsychotic medications at this time discussed maximizing the dosage of his Seroquel which was recently added and tapering him off of one of his medications likely the risperidone.  Patient as well as mother agreeable with  plan.  Reviewed medical records obtained from [facility] behavioral health-as noted below.      Visit Diagnosis:    ICD-10-CM   1. Schizophrenia, unspecified type ([place])  F20.9 risperiDONE (RISPERDAL) 2 MG tablet    QUEtiapine (SEROQUEL XR) 200 MG 24 hr tablet    fluPHENAZine (PROLIXIN) 10 MG tablet    benztropine (COGENTIN) 1 MG tablet    traZODone (DESYREL) 100 MG tablet    DISCONTINUED: traZODone (DESYREL) 100 MG tablet    2. High risk medication use  Z79.899     3. At risk for prolonged QT interval syndrome  Z91.89 EKG 12-Lead      Past Psychiatric History: Reviewed medical records received from [facility] -dated 06/13/2019-, 09/19/19, 11/07/2019, 11/21/2019, 12/05/19, 01/02/20, 02/27/2020, 03/19/2020, 04/16/2020, 07/16/2020, 01/07/2021 Patient tried on multiple medications, Caplyta, Abilify titrated up to 20 mg, Depakote, risperidone, fluphenazine, Seroquel. At his visit on 01/07/2021 his Seroquel was increased to 150 mg, risperidone was continued at 8 mg, fluphenazine 5 mg.  Patient on 3 antipsychotic medications.  I have also reviewed past psychiatric history from my progress note on 08/21/2021.  Patient has tried and failed other medications again including-Haldol caused seizures, Clozaril did not tolerate, Seroquel, Prolixin, Zyprexa, risperidone.  Past Medical History:  Past Medical History:  Diagnosis Date   Abdominal pain    Anxiety    Constipation    Decreased visual acuity    Nevus    Polyuria    Recurrent boils    scalp behind ear   Schizophrenia (HCC)    Seizures ([place]) 08/15/1990   due to haldol   Soft tissue mass    left scalp posterior  Past Surgical History:  Procedure Laterality Date   COLONOSCOPY WITH PROPOFOL N/A 02/14/2020   Procedure: COLONOSCOPY WITH PROPOFOL;  Surgeon: Virgel Manifold, MD;  Location: [facility] ENDOSCOPY;  Service: Endoscopy;  Laterality: N/A;   COLONOSCOPY WITH PROPOFOL N/A 02/28/2020   Procedure:  COLONOSCOPY WITH PROPOFOL;  Surgeon: Virgel Manifold, MD;  Location: [facility] ENDOSCOPY;  Service: Endoscopy;  Laterality: N/A;   COLONOSCOPY WITH PROPOFOL N/A 03/18/2021   Procedure: COLONOSCOPY WITH PROPOFOL;  Surgeon: Virgel Manifold, MD;  Location: [facility] ENDOSCOPY;  Service: Endoscopy;  Laterality: N/A;   LAPAROSCOPIC RIGHT COLECTOMY Right 03/11/2020   Procedure: LAPAROSCOPIC RIGHT COLECTOMY, possible open;  Surgeon: Olean Ree, MD;  Location: [facility] ORS;  Service: General;  Laterality: Right;   MASS EXCISION Left 02/11/2015   Procedure: EXCISION MASS, left post neck;  Surgeon: Florene Glen, MD;  Location: [place];  Service: General;  Laterality: Left;    Family Psychiatric History: Reviewed family psychiatric history from 08/21/2021.  Family History:  Family History  Problem Relation Age of Onset   Hyperlipidemia Mother    Hypertension Mother    Fibromyalgia Mother    Hypertension Father    Diabetes Father    Hyperlipidemia Father    Mental illness Neg Hx     Social History: Reviewed social history from my progress note on 08/21/2021. Social History   Socioeconomic History   Marital status: Single    Spouse name: Not on file   Number of children: Not on file   Years of education: Not on file   Highest education level: 12th grade  Occupational History   Occupation: on disability  Tobacco Use   Smoking status: Never   Smokeless tobacco: Never  Vaping Use   Vaping Use: Never used  Substance and Sexual Activity   Alcohol use: No    Alcohol/week: 0.0 standard drinks   Drug use: No   Sexual activity: Not Currently  Other Topics Concern   Not on file  Social History Narrative   Not on file   Social Determinants of Health   Financial Resource Strain: Not on file  Food Insecurity: Not on file  Transportation Needs: Not on file  Physical Activity: Not on file  Stress: Not on file  Social Connections: Not on file    Allergies: No Known  Allergies  Metabolic Disorder Labs: Lab Results  Component Value Date   HGBA1C 5.5 08/24/2021   MPG 111 08/24/2021   MPG 102.54 09/06/2017   Lab Results  Component Value Date   PROLACTIN 41.2 (H) 08/24/2021   Lab Results  Component Value Date   CHOL 214 (H) 08/24/2021   TRIG 103 08/24/2021   HDL 44 08/24/2021   CHOLHDL 4.9 08/24/2021   VLDL 21 08/24/2021   LDLCALC 149 (H) 08/24/2021   LDLCALC 102 (H) 09/06/2017   Lab Results  Component Value Date   TSH 1.047 08/24/2021   TSH 1.247 09/06/2017    Therapeutic Level Labs: No results found for: LITHIUM Lab Results  Component Value Date   VALPROATE 97 04/28/2019   VALPROATE 99 09/09/2017   No components found for:  CBMZ  Current Medications: Current Outpatient Medications  Medication Sig Dispense Refill   cetirizine (ZYRTEC) 10 MG tablet Take 10 mg by mouth daily as needed for allergies.     docusate sodium (COLACE) 100 MG capsule Take 1 capsule (100 mg total) by mouth daily. (Patient taking differently: Take 100 mg by mouth at bedtime.) 30 capsule 1  famotidine (PEPCID) 20 MG tablet Take 20 mg by mouth 2 (two) times daily.     fluPHENAZine (PROLIXIN) 10 MG tablet Take 1 tablet (10 mg total) by mouth daily after lunch. 30 tablet 1   fluticasone (FLONASE) 50 MCG/ACT nasal spray Place 2 sprays into the nose daily as needed for allergies.      QUEtiapine (SEROQUEL XR) 200 MG 24 hr tablet Take 1 tablet (200 mg total) by mouth at bedtime. 30 tablet 1   risperiDONE (RISPERDAL) 2 MG tablet Take 0.5 tablets (1 mg total) by mouth 2 (two) times daily. Take half tablet daily at 8 AM and half tablet at 5 pm 30 tablet 1   benztropine (COGENTIN) 1 MG tablet Take 1 tablet (1 mg total) by mouth daily. 30 tablet 1   traZODone (DESYREL) 100 MG tablet Take 1 tablet (100 mg total) by mouth at bedtime. 30 tablet 1   No current facility-administered medications for this visit.     Musculoskeletal: Strength & Muscle Tone: within normal  limits Gait & Station: normal Patient leans: N/A  Psychiatric Specialty Exam: Review of Systems  Psychiatric/Behavioral: Negative.    All other systems reviewed and are negative.  Blood pressure 116/81, pulse 93, temperature 98.7 F (37.1 C), temperature source Temporal, weight 170 lb (77.1 kg).Body mass index is 25.1 kg/m.  General Appearance: Casual  Eye Contact:  Fair  Speech:  Clear and Coherent  Volume:  Normal  Mood:  Euthymic  Affect:  Labile  Thought Process:  Goal Directed and Descriptions of Associations: Intact  Orientation:  Full (Time, Place, and Person)  Thought Content: Logical , however was observed as responding to internal stimuli in session today.  Likely chronic-patient able to cope with it.  Suicidal Thoughts:  No  Homicidal Thoughts:  No  Memory:  Immediate;   Fair Recent;   Fair Remote;   Poor  Judgement:  Fair  Insight:  Shallow  Psychomotor Activity:  Normal  Concentration:  Concentration: Fair and Attention Span: Fair  Recall:  [employer] of Knowledge: Poor  Language: Fair  Akathisia:  No  Handed:  Right  AIMS (if indicated): done,0  Assets:  Communication Skills Desire for Improvement Housing Social Support Transportation  ADL's:  Intact  Cognition: WNL  Sleep:  Fair   Screenings: AIMS    [place] Office Visit from 09/09/2021 in [place] Admission (Discharged) from 09/05/2017 in [facility] Total Score 0 0      AUDIT    [place] Admission (Discharged) from 09/05/2017 in [place]  Alcohol Use Disorder Identification Test Final Score (AUDIT) 0      GAD-7    Flowsheet Row Office Visit from 08/21/2021 in [place]  Total GAD-7 Score 0      PHQ2-9    [place] Visit from 09/09/2021 in [place] Visit from 08/21/2021 in [facility]   PHQ-2 Total Score 0 0  PHQ-9 Total Score 0 0      [place] Visit from 08/21/2021 in [place] Admission (Discharged) from 03/18/2021 in [facility] No Risk No Risk        Assessment and Plan: Carl [name]. Is a 51 year old African-American male on disability, has a history of schizophrenia, multiple antipsychotic medications with chronic auditory hallucinations, failed trials of Clozaril (developed side effects), presented for follow-up  appointment.  Patient currently on 3 antipsychotic medications, due to concerns for long-term adverse side effects, will benefit from the following medication readjustment.  Plan as noted below.  Plan Schizophrenia-stable Prolixin 10 mg p.o. daily after lunch Plan to taper off risperidone, will reduce risperidone to 1 mg twice daily. Increase Seroquel extended release to 200 mg p.o. nightly.  We will continue to readjust the dosage of Seroquel if he tolerates it well.   Trazodone 100 mg p.o. nightly as needed  High risk medication use-reviewed and discussed the following labs dated 08/24/2021-creatinine-1.21 within normal limits, hepatic function panel within normal limits except for AST-low at 13, TSH-within normal limits at 1.047, sodium-137-within normal limits, BUN-within normal limits, prolactin-elevated at 41.2, hemoglobin A1c-5.5-within normal limits, lipid panel-abnormal at cholesterol 214 elevated, LDL-elevated at 149 otherwise within normal limits.  At risk for prolonged QT syndrome-noncompliant with EKG.  We will order EKG again.  Encourage compliance.  Provided phone #[phone].  Mother agrees to contact to get the EKG done.  Patient on 3 antipsychotic medications will need his EKG monitored.  Reviewed medical records received from [facility] behavioral health; Elliot Dally PA -dated 06/13/2019-, 09/19/19, 11/07/2019, 11/21/2019, 12/05/19, 01/02/20,  02/27/2020, 03/19/2020, 04/16/2020, 07/16/2020, 01/07/2021 Patient tried on multiple medications, Caplyta, Abilify titrated up to 20 mg, Depakote, risperidone, fluphenazine, Seroquel. At his visit on 01/07/2021 his Seroquel was increased to 150 mg, risperidone was continued at 8 mg, fluphenazine 5 mg.  Patient on 3 antipsychotic medications.  As noted above I have also spoken to pharmacist at [facility] while in session to verify his medication dosages.  Collateral information obtained from mother.  Follow-up in clinic in 6 to 7 weeks or sooner in person.     Consent: Patient/Guardian gives verbal consent for treatment and assignment of benefits for services provided during this visit. Patient/Guardian expressed understanding and agreed to proceed.   This note was generated in part or whole with voice recognition software. Voice recognition is usually quite accurate but there are transcription errors that can and very often do occur. I apologize for any typographical errors that were not detected and corrected.      Ursula Alert, MD 09/09/2021, 12:54 PM

## 2021-09-09 NOTE — Patient Instructions (Signed)
Please call for [phone] 

## 2021-09-22 ENCOUNTER — Telehealth: Payer: Self-pay | Admitting: Psychiatry

## 2021-09-22 DIAGNOSIS — F209 Schizophrenia, unspecified: Secondary | ICD-10-CM

## 2021-09-22 MED ORDER — QUETIAPINE FUMARATE ER 300 MG PO TB24: 300.0000 mg | ORAL_TABLET | Freq: Every day | ORAL | 1 refills | Status: DC

## 2021-09-22 NOTE — Telephone Encounter (Signed)
Spoke to mother who reports patient responding to internal stimuli, laughing inappropriately likely hearing voices.  Getting worse since the dosage reduction and recent medication changes. ? ?Discussed to continue risperidone 1 mg twice a day and the Prolixin as prescribed. ? ?Will increase Seroquel XR to 300 mg at bedtime. ? ?Crisis plan discussed with mother, advised to take to the nearest emergency department for possible admission if patient is not doing well. ?

## 2021-09-25 ENCOUNTER — Ambulatory Visit (INDEPENDENT_AMBULATORY_CARE_PROVIDER_SITE_OTHER): Payer: Medicare Other | Admitting: Surgery

## 2021-09-25 ENCOUNTER — Encounter: Payer: Self-pay | Admitting: Surgery

## 2021-09-25 ENCOUNTER — Other Ambulatory Visit: Payer: Self-pay

## 2021-09-25 VITALS — BP 113/83 | HR 92 | Temp 98.3°F | Ht 69.0 in | Wt 170.0 lb

## 2021-09-25 DIAGNOSIS — C183 Malignant neoplasm of hepatic flexure: Secondary | ICD-10-CM | POA: Diagnosis not present

## 2021-09-25 NOTE — Patient Instructions (Signed)
If you have any concerns or questions, please feel free to call our office. Follow up as needed.  ? ?Constipation, Adult ? ?Constipation is when a person has trouble pooping (having a bowel movement). When you have this condition, you may poop fewer than 3 times a week. Your poop (stool) may also be dry, hard, or bigger than normal. ?Follow these instructions at home: ?Eating and drinking ? ?Eat foods that have a lot of fiber, such as: ?Fresh fruits and vegetables. ?Whole grains. ?Beans. ?Eat less of foods that are low in fiber and high in fat and sugar, such as: ?[place] fries. ?Hamburgers. ?Cookies. ?Candy. ?Soda. ?Drink enough fluid to keep your pee (urine) pale yellow. ?General instructions ?Exercise regularly or as told by your doctor. Try to do 150 minutes of exercise each week. ?Go to the restroom when you feel like you need to poop. Do not hold it in. ?Take over-the-counter and prescription medicines only as told by your doctor. These include any fiber supplements. ?When you poop: ?Do deep breathing while relaxing your lower belly (abdomen). ?Relax your pelvic floor. The pelvic floor is a group of muscles that support the rectum, bladder, and intestines (as well as the uterus in women). ?Watch your condition for any changes. Tell your doctor if you notice any. ?Keep all follow-up visits as told by your doctor. This is important. ?Contact a doctor if: ?You have pain that gets worse. ?You have a fever. ?You have not pooped for 4 days. ?You vomit. ?You are not hungry. ?You lose weight. ?You are bleeding from the opening of the butt (anus). ?You have thin, pencil-like poop. ?Get help right away if: ?You have a fever, and your symptoms suddenly get worse. ?You leak poop or have blood in your poop. ?Your belly feels hard or bigger than normal (bloated). ?You have very bad belly pain. ?You feel dizzy or you faint. ?Summary ?Constipation is when a person poops fewer than 3 times a week, has trouble pooping, or has  poop that is dry, hard, or bigger than normal. ?Eat foods that have a lot of fiber. ?Drink enough fluid to keep your pee (urine) pale yellow. ?Take over-the-counter and prescription medicines only as told by your doctor. These include any fiber supplements. ?This information is not intended to replace advice given to you by your health care provider. Make sure you discuss any questions you have with your health care provider. ?Document Revised: 05/09/2019 Document Reviewed: 05/09/2019 ?Elsevier Patient Education ? 2022 [place]. ? ? ?

## 2021-09-27 ENCOUNTER — Encounter: Payer: Self-pay | Admitting: Surgery

## 2021-09-27 NOTE — Progress Notes (Signed)
?09/25/2021 ? ?History of Present Illness: ?Carl [name]. is a 51 y.o. male s/p laparoscopic right colectomy on 03/11/20 for Stage I colon cancer at the hepatic flexure.  Presents today for follow up.  He reports he's been doing well, without any complaints.  He is tolerating regular diet without issues, denies any nausea, vomiting, abdominal pain, constipation, diarrhea, or blood in the stool.   ? ?Past Medical History: ?Past Medical History:  ?Diagnosis Date  ? Abdominal pain   ? Anxiety   ? Constipation   ? Decreased visual acuity   ? Nevus   ? Polyuria   ? Recurrent boils   ? scalp behind ear  ? Schizophrenia ([place])   ? Seizures ([place]) 08/15/1990  ? due to haldol  ? Soft tissue mass   ? left scalp posterior  ?  ? ?Past Surgical History: ?Past Surgical History:  ?Procedure Laterality Date  ? COLONOSCOPY WITH PROPOFOL N/A 02/14/2020  ? Procedure: COLONOSCOPY WITH PROPOFOL;  Surgeon: Virgel Manifold, MD;  Location: [facility] END[facility]COPY;  Service: Endoscopy;  Laterality: N/A;  ? COLONOSCOPY WITH PROPOFOL N/A 02/28/2020  ? Procedure: COLONOSCOPY WITH PROPOFOL;  Surgeon: Virgel Manifold, MD;  Location: [facility];  Service: Endoscopy;  Laterality: N/A;  ? COLONOSCOPY WITH PROPOFOL N/A 03/18/2021  ? Procedure: COLONOSCOPY WITH PROPOFOL;  Surgeon: Virgel Manifold, MD;  Location: [facility];  Service: Endoscopy;  Laterality: N/A;  ? LAPAROSCOPIC RIGHT COLECTOMY Right 03/11/2020  ? Procedure: LAPAROSCOPIC RIGHT COLECTOMY, possible open;  Surgeon: Olean Ree, MD;  Location: [facility] OR[facility];  Service: General;  Laterality: Right;  ? MASS EXCISION Left 02/11/2015  ? Procedure: EXCISION MASS, left post neck;  Surgeon: Florene Glen, MD;  [facility];  Service: General;  Laterality: Left;  ? ? ?Home Medications: ?Prior to Admission medications   ?Medication Sig Start Date End Date Taking? Authorizing Provider  ?benztropine (COGENTIN) 1 MG tablet Take 1 tablet (1 mg total) by mouth daily. 09/09/21  Yes  Ursula Alert, MD  ?cetirizine (ZYRTEC) 10 MG tablet Take 10 mg by mouth daily as needed for allergies.   Yes [provider]  ?docusate sodium (COLACE) 100 MG capsule Take 1 capsule (100 mg total) by mouth daily. ?Patient taking differently: Take 100 mg by mouth at bedtime. 09/13/17  Yes Pucilowska, Jolanta B, MD  ?famotidine (PEPCID) 20 MG tablet Take 20 mg by mouth 2 (two) times daily. 08/06/21  Yes [provider]  ?fluPHENAZine (PROLIXIN) 10 MG tablet Take 1 tablet (10 mg total) by mouth daily after lunch. 09/09/21  Yes Ursula Alert, MD  ?fluticasone (FLONASE) 50 MCG/ACT nasal spray Place 2 sprays into the nose daily as needed for allergies.  01/02/16  Yes [provider]  ?QUEtiapine (SEROQUEL XR) 300 MG 24 hr tablet Take 1 tablet (300 mg total) by mouth at bedtime. 09/22/21  Yes Ursula Alert, MD  ?risperiDONE (RISPERDAL) 2 MG tablet Take 0.5 tablets (1 mg total) by mouth 2 (two) times daily. Take half tablet daily at 8 AM and half tablet at 5 pm 09/09/21  Yes Eappen, Ria Clock, MD  ?traZODone (DESYREL) 100 MG tablet Take 1 tablet (100 mg total) by mouth at bedtime. 09/09/21  Yes Ursula Alert, MD  ? ? ?Allergies: ?No Known Allergies ? ?Review of Systems: ?Review of Systems  ?Constitutional:  Negative for chills and fever.  ?Respiratory:  Negative for shortness of breath.   ?Cardiovascular:  Negative for chest pain.  ?Gastrointestinal:  Negative for abdominal pain, blood in  stool, constipation, diarrhea, nausea and vomiting.  ? ?Physical Exam ?BP 113/83   Pulse 92   Temp 98.3 ?F (36.8 ?C) (Oral)   Ht '5\' 9"'$  (1.753 m)   Wt 170 lb (77.1 kg)   SpO2 98%   BMI 25.10 kg/m?  ?CONSTITUTIONAL: No acute distress, well nourished. ?HEENT:  Normocephalic, atraumatic, extraocular motion intact. ?RESPIRATORY:  Lungs are clear, and breath sounds are equal bilaterally. Normal respiratory effort without pathologic use of accessory muscles. ?CARDIOVASCULAR: Heart is regular without murmurs, gallops,  or rubs. ?GI: The abdomen is soft, non-distended, non-tender to palpation.  Incisions are well healed, without any evidence of hernia formation. There were no palpable masses.  ?NEUROLOGIC:  Motor and sensation is grossly normal.  Cranial nerves are grossly intact. ?PSYCH:  Alert and oriented to person, place and time. Affect is normal. ? ?Labs/Imaging: ?CEA on 04/01/21 -- 1.9 ? ?Assessment and Plan: ?This is a 51 y.o. male s/p laparoscopic right colectomy for Stage I colon cancer ? ?--Patient is doing very well and has remained stable, without any suspicious findings or symptoms. ?--For now, there are no surgical needs and he may follow up as needed.  Discussed return precautions and we'd be readily available if any concerns, questions, or issues. ? ?I spent 15 minutes dedicated to the care of this patient on the date of this encounter to include pre-visit review of records, face-to-face time with the patient discussing diagnosis and management, and any post-visit coordination of care. ? ? ?Melvyn Neth, MD ?[facility] ? ? ?  ?

## 2021-10-22 ENCOUNTER — Encounter: Payer: Self-pay | Admitting: Psychiatry

## 2021-10-22 ENCOUNTER — Ambulatory Visit (INDEPENDENT_AMBULATORY_CARE_PROVIDER_SITE_OTHER): Payer: Medicare Other | Admitting: Psychiatry

## 2021-10-22 VITALS — BP 102/71 | HR 99 | Temp 97.8°F | Wt 169.4 lb

## 2021-10-22 DIAGNOSIS — Z9189 Other specified personal risk factors, not elsewhere classified: Secondary | ICD-10-CM

## 2021-10-22 DIAGNOSIS — F203 Undifferentiated schizophrenia: Secondary | ICD-10-CM | POA: Diagnosis not present

## 2021-10-22 DIAGNOSIS — F209 Schizophrenia, unspecified: Secondary | ICD-10-CM

## 2021-10-22 MED ORDER — QUETIAPINE FUMARATE ER 300 MG PO TB24: 300.0000 mg | ORAL_TABLET | Freq: Every day | ORAL | 3 refills | Status: DC

## 2021-10-22 MED ORDER — FLUPHENAZINE HCL 10 MG PO TABS: 10.0000 mg | ORAL_TABLET | Freq: Every day | ORAL | 3 refills | Status: DC

## 2021-10-22 MED ORDER — RISPERIDONE 2 MG PO TABS: 1.0000 mg | ORAL_TABLET | Freq: Two times a day (BID) | ORAL | 3 refills | Status: DC

## 2021-10-22 MED ORDER — BENZTROPINE MESYLATE 1 MG PO TABS: 1.0000 mg | ORAL_TABLET | Freq: Every day | ORAL | 3 refills | Status: DC

## 2021-10-22 MED ORDER — TRAZODONE HCL 100 MG PO TABS: 100.0000 mg | ORAL_TABLET | Freq: Every day | ORAL | 3 refills | Status: DC

## 2021-10-23 NOTE — Progress Notes (Signed)
[facility] MD OP Progress Note ? ?10/22/2021 5:01 PM ?Carl [name].  ?MRN:  [id number] ? ?Chief Complaint:  ?Chief Complaint  ?Patient presents with  ? Follow-up: 51 year old African-American male with history of schizophrenia presented for medication management.  ? ?HPI: Carl [name]. Is a 51 year old African-American male, single, lives in [place], history of schizophrenia on multiple psychotropic medications presented for medication management. ? ?Patient being a limited historian and collateral information obtained from mother-[name]. ? ?Patient today appeared to be cooperative.  Did not appear to be responding to internal stimuli.  Denied any perceptual disturbances.  Patient appeared to be alert, oriented to self, situation. ? ?Patient denied any sadness, anxiety.  Denied any mood swings. ? ?Compliant on medications.  Denies side effects. ? ?Reports spends time watching TV as well as gets out of the house for walks. ? ?Per mother patient currently doing fairly well on the current combination of medication.  His sleep has improved a lot.  Denies any other concerns today. ? ?Visit Diagnosis:  ?  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  risperiDONE (RISPERDAL) 2 MG tablet  ?  QUEtiapine (SEROQUEL XR) 300 MG 24 hr tablet  ?  fluPHENAZine (PROLIXIN) 10 MG tablet  ?  benztropine (COGENTIN) 1 MG tablet  ?  ?2. At risk for prolonged QT interval syndrome  Z91.89   ?  ? ? ?Past Psychiatric History: Reviewed past psychiatric history from progress note on 09/09/2021.  I have also reviewed past psychiatric history from progress note on 08/21/2021. ? ?Past Medical History:  ?Past Medical History:  ?Diagnosis Date  ? Abdominal pain   ? Anxiety   ? Constipation   ? Decreased visual acuity   ? Nevus   ? Polyuria   ? Recurrent boils   ? scalp behind ear  ? Schizophrenia ([place])   ? Seizures ([place]) 08/15/1990  ? due to haldol  ? Soft tissue mass   ? left scalp posterior  ?  ?Past  Surgical History:  ?Procedure Laterality Date  ? COLONOSCOPY WITH PROPOFOL N/A 02/14/2020  ? Procedure: COLONOSCOPY WITH PROPOFOL;  Surgeon: Virgel Manifold, MD;  Location: [facility];  Service: Endoscopy;  Laterality: N/A;  ? COLONOSCOPY WITH PROPOFOL N/A 02/28/2020  ? Procedure: COLONOSCOPY WITH PROPOFOL;  Surgeon: Virgel Manifold, MD;  Location: [facility];  Service: Endoscopy;  Laterality: N/A;  ? COLONOSCOPY WITH PROPOFOL N/A 03/18/2021  ? Procedure: COLONOSCOPY WITH PROPOFOL;  Surgeon: Virgel Manifold, MD;  Location: [facility];  Service: Endoscopy;  Laterality: N/A;  ? LAPAROSCOPIC RIGHT COLECTOMY Right 03/11/2020  ? Procedure: LAPAROSCOPIC RIGHT COLECTOMY, possible open;  Surgeon: Olean Ree, MD;  Location: [facility];  Service: General;  Laterality: Right;  ? MASS EXCISION Left 02/11/2015  ? Procedure: EXCISION MASS, left post neck;  Surgeon: Florene Glen, MD;  Location: [facility];  Service: General;  Laterality: Left;  ? ? ?Family Psychiatric History: Reviewed family psychiatric history from progress note on 08/21/2021. ? ?Family History:  ?Family History  ?Problem Relation Age of Onset  ? Hyperlipidemia Mother   ? Hypertension Mother   ? Fibromyalgia Mother   ? Hypertension Father   ? Diabetes Father   ? Hyperlipidemia Father   ? Mental illness Neg Hx   ? ? ?Social History: Reviewed social history from progress note on 08/21/2021. ?Social History  ? ?Socioeconomic History  ? Marital status: Single  ?  Spouse name: Not on file  ?  Number of children: Not on file  ? Years of education: Not on file  ? Highest education level: 12th grade  ?Occupational History  ? Occupation: on disability  ?Tobacco Use  ? Smoking status: Never  ? Smokeless tobacco: Never  ?Vaping Use  ? Vaping Use: Never used  ?Substance and Sexual Activity  ? Alcohol use: No  ?  Alcohol/week: 0.0 standard drinks  ? Drug use: No  ? Sexual activity: Not Currently  ?Other Topics Concern  ? Not on file  ?Social  History Narrative  ? Not on file  ? ?Social Determinants of Health  ? ?Financial Resource Strain: Not on file  ?Food Insecurity: Not on file  ?Transportation Needs: Not on file  ?Physical Activity: Not on file  ?Stress: Not on file  ?Social Connections: Not on file  ? ? ?Allergies: No Known Allergies ? ?Metabolic Disorder Labs: ?Lab Results  ?Component Value Date  ? HGBA1C 5.5 08/24/2021  ? MPG 111 08/24/2021  ? MPG 102.54 09/06/2017  ? ?Lab Results  ?Component Value Date  ? PROLACTIN 41.2 (H) 08/24/2021  ? ?Lab Results  ?Component Value Date  ? CHOL 214 (H) 08/24/2021  ? TRIG 103 08/24/2021  ? HDL 44 08/24/2021  ? CHOLHDL 4.9 08/24/2021  ? VLDL 21 08/24/2021  ? LDLCALC 149 (H) 08/24/2021  ? LDLCALC 102 (H) 09/06/2017  ? ?Lab Results  ?Component Value Date  ? TSH 1.047 08/24/2021  ? TSH 1.247 09/06/2017  ? ? ?Therapeutic Level Labs: ?No results found for: LITHIUM ?Lab Results  ?Component Value Date  ? VALPROATE 97 04/28/2019  ? VALPROATE 99 09/09/2017  ? ?No components found for:  CBMZ ? ?Current Medications: ?Current Outpatient Medications  ?Medication Sig Dispense Refill  ? cetirizine (ZYRTEC) 10 MG tablet Take 10 mg by mouth daily as needed for allergies.    ? docusate sodium (COLACE) 100 MG capsule Take 1 capsule (100 mg total) by mouth daily. (Patient taking differently: Take 100 mg by mouth at bedtime.) 30 capsule 1  ? famotidine (PEPCID) 20 MG tablet Take 20 mg by mouth 2 (two) times daily.    ? fluticasone (FLONASE) 50 MCG/ACT nasal spray Place 2 sprays into the nose daily as needed for allergies.     ? benztropine (COGENTIN) 1 MG tablet Take 1 tablet (1 mg total) by mouth daily. 30 tablet 3  ? fluPHENAZine (PROLIXIN) 10 MG tablet Take 1 tablet (10 mg total) by mouth daily after lunch. 30 tablet 3  ? QUEtiapine (SEROQUEL XR) 300 MG 24 hr tablet Take 1 tablet (300 mg total) by mouth at bedtime. 30 tablet 3  ? risperiDONE (RISPERDAL) 2 MG tablet Take 0.5 tablets (1 mg total) by mouth 2 (two) times daily. Take  half tablet daily at 8 AM and half tablet at 5 pm 30 tablet 3  ? traZODone (DESYREL) 100 MG tablet Take 1 tablet (100 mg total) by mouth at bedtime. 30 tablet 3  ? ?No current facility-administered medications for this visit.  ? ? ? ?Musculoskeletal: ?Strength & Muscle Tone: within normal limits ?Gait & Station: normal ?Patient leans: N/A ? ?Psychiatric Specialty Exam: ?Review of Systems  ?Psychiatric/Behavioral:  Negative for agitation, behavioral problems, confusion, decreased concentration, dysphoric mood, hallucinations, self-injury, sleep disturbance and suicidal ideas. The patient is not nervous/anxious and is not hyperactive.   ?All other systems reviewed and are negative.  ?Blood pressure 102/71, pulse 99, temperature 97.8 ?F (36.6 ?C), temperature source Temporal, weight 169 lb 6.4 oz (76.8 kg).Body mass  index is 25.02 kg/m?.  ?General Appearance: Casual  ?Eye Contact:  Fair  ?Speech:  Clear and Coherent  ?Volume:  Normal  ?Mood:  Euthymic  ?Affect:  Restricted  ?Thought Process:  Goal Directed and Descriptions of Associations: Intact  ?Orientation:  Other:  person, situation  ?Thought Content: Logical   ?Suicidal Thoughts:  No  ?Homicidal Thoughts:  No  ?Memory:  Immediate;   Fair ?Recent;   limited ?Remote;   limited  ?Judgement:  Fair  ?Insight:  Shallow  ?Psychomotor Activity:  Normal  ?Concentration:  Concentration: Fair and Attention Span: Fair  ?Recall:  Fair  ?Fund of Knowledge:  limited  ?Language: Fair  ?Akathisia:  No  ?Handed:  Right  ?AIMS (if indicated): done  ?Assets:  Desire for Improvement ?Housing ?Social Support  ?ADL's:  Intact  ?Cognition: baseline  ?Sleep:  Fair  ? ?Screenings: ?AIMS   ? ?[facility] Office Visit from 10/22/2021 in [facility] Office Visit from 09/09/2021 in [facility] Admission (Discharged) from 09/05/2017 in [facility]  ?AIMS Total Score 0 0 0  ? ?  ? ?AUDIT   ? ?Flowsheet Row Admission  (Discharged) from 09/05/2017 in [facility]  ?Alcohol Use Disorder Identification Test Final Score (AUDIT) 0  ? ?  ? ?GAD-7   ? ?[place] Office Visit from 08/21/2021 in [facility]

## 2022-01-21 ENCOUNTER — Encounter: Payer: Self-pay | Admitting: Psychiatry

## 2022-01-21 ENCOUNTER — Ambulatory Visit (INDEPENDENT_AMBULATORY_CARE_PROVIDER_SITE_OTHER): Payer: Medicare Other | Admitting: Psychiatry

## 2022-01-21 VITALS — BP 122/87 | HR 87 | Temp 98.1°F | Wt 168.8 lb

## 2022-01-21 DIAGNOSIS — F203 Undifferentiated schizophrenia: Secondary | ICD-10-CM

## 2022-01-21 MED ORDER — BENZTROPINE MESYLATE 1 MG PO TABS: 1.0000 mg | ORAL_TABLET | Freq: Every day | ORAL | 3 refills | Status: DC

## 2022-01-21 MED ORDER — QUETIAPINE FUMARATE ER 300 MG PO TB24: 300.0000 mg | ORAL_TABLET | Freq: Every day | ORAL | 3 refills | Status: DC

## 2022-01-21 MED ORDER — QUETIAPINE FUMARATE ER 50 MG PO TB24: 50.0000 mg | ORAL_TABLET | ORAL | 1 refills | Status: DC

## 2022-01-21 MED ORDER — FLUPHENAZINE HCL 10 MG PO TABS: 10.0000 mg | ORAL_TABLET | Freq: Every day | ORAL | 3 refills | Status: DC

## 2022-01-21 NOTE — Progress Notes (Signed)
[facility] MD OP Progress Note  01/21/2022 5:47 PM Carl Carl [name].  MRN:  [id number]  Chief Complaint:  Chief Complaint  Patient presents with   Follow-up: 51 year old African-American male, with history of schizophrenia, presented for medication management.   HPI: Carl Carl [name] a 51 year old African-American male, single, lives in [place], has a history of schizophrenia and multiple psychotropic medications, presented for medication management.  Patient being a limited historian collateral information obtained from mother-Carl [name].  Patient today appeared to be cooperative, pleasant.  Did not appear to be responding to internal stimuli during the session.  Reports he is currently tolerating the current medications.  Patient denies any suicidality or homicidality.  Appeared to be Carl [name], oriented to person place time situation.  Per collateral information obtained from mother patient continues to have episodes of hallucinations , is often seen as talking to himself.  Patient however has not had any acting out episodes.  He has not had any episodes of aggression.  He does not have a good sleep hygiene.  He can go to bed anytime between 1 AM or 2 AM and can stay in bed until the next day afternoon.  He watches TV most of the time.  He does take walks on and off.  Denies any other concerns today.   Patient compliant on medications.  Denies side effects.  Agreeable to follow the readjustment of his medications since he is on 3 different antipsychotics.     Visit Diagnosis:    ICD-10-CM   1. Undifferentiated schizophrenia ([facility])  F20.3 QUEtiapine (SEROQUEL XR) 300 MG 24 hr tablet    QUEtiapine (SEROQUEL XR) 50 MG TB24 24 hr tablet    fluPHENAZine (PROLIXIN) 10 MG tablet    benztropine (COGENTIN) 1 MG tablet      Past Psychiatric History: Reviewed past psychiatric history from progress note on 09/09/2021.  Reviewed past psychiatric history from progress note on 08/21/2021.  Past Medical History:   Past Medical History:  Diagnosis Date   Abdominal pain    Anxiety    Constipation    Decreased visual acuity    Nevus    Polyuria    Recurrent boils    scalp behind ear   Schizophrenia ([facility])    Seizures ([place]) 08/15/1990   due to haldol   Soft tissue mass    left scalp posterior    Past Surgical History:  Procedure Laterality Date   COLONOSCOPY WITH PROPOFOL N/A 02/14/2020   Procedure: COLONOSCOPY WITH PROPOFOL;  Surgeon: Virgel Manifold, MD;  Location: [facility];  Service: Endoscopy;  Laterality: N/A;   COLONOSCOPY WITH PROPOFOL N/A 02/28/2020   Procedure: COLONOSCOPY WITH PROPOFOL;  Surgeon: Virgel Manifold, MD;  Location: [facility];  Service: Endoscopy;  Laterality: N/A;   COLONOSCOPY WITH PROPOFOL N/A 03/18/2021   Procedure: COLONOSCOPY WITH PROPOFOL;  Surgeon: Virgel Manifold, MD;  Location: [facility];  Service: Endoscopy;  Laterality: N/A;   LAPAROSCOPIC RIGHT COLECTOMY Right 03/11/2020   Procedure: LAPAROSCOPIC RIGHT COLECTOMY, possible open;  Surgeon: Olean Ree, MD;  Location: [facility];  Service: General;  Laterality: Right;   MASS EXCISION Left 02/11/2015   Procedure: EXCISION MASS, left post neck;  Surgeon: Florene Glen, MD;  Location: [facility];  Service: General;  Laterality: Left;    Family Psychiatric History: Reviewed family psychiatric history from progress note on 09/09/2021.  Family History:  Family History  Problem Relation Age of Onset   Hyperlipidemia Mother    Hypertension Mother  Fibromyalgia Mother    Hypertension Father    Diabetes Father    Hyperlipidemia Father    Mental illness Neg Hx     Social History: Reviewed social history from progress note on 09/09/2021. Social History   Socioeconomic History   Marital status: Single    Spouse name: Not on file   Number of children: Not on file   Years of education: Not on file   Highest education level: 12th grade  Occupational History   Occupation: on  disability  Tobacco Use   Smoking status: Never   Smokeless tobacco: Never  Vaping Use   Vaping Use: Never used  Substance and Sexual Activity   Alcohol use: No    Alcohol/week: 0.0 standard drinks of alcohol   Drug use: No   Sexual activity: Not Currently  Other Topics Concern   Not on file  Social History Narrative   Not on file   Social Determinants of Health   Financial Resource Strain: Not on file  Food Insecurity: Not on file  Transportation Needs: Not on file  Physical Activity: Not on file  Stress: Not on file  Social Connections: Not on file    Allergies: No Known Allergies  Metabolic Disorder Labs: Lab Results  Component Value Date   HGBA1C 5.5 08/24/2021   MPG 111 08/24/2021   MPG 102.54 09/06/2017   Lab Results  Component Value Date   PROLACTIN 41.2 (H) 08/24/2021   Lab Results  Component Value Date   CHOL 214 (H) 08/24/2021   TRIG 103 08/24/2021   HDL 44 08/24/2021   CHOLHDL 4.9 08/24/2021   VLDL 21 08/24/2021   LDLCALC 149 (H) 08/24/2021   LDLCALC 102 (H) 09/06/2017   Lab Results  Component Value Date   TSH 1.047 08/24/2021   TSH 1.247 09/06/2017    Therapeutic Level Labs: No results found for: "LITHIUM" Lab Results  Component Value Date   VALPROATE 97 04/28/2019   VALPROATE 99 09/09/2017   No results found for: "CBMZ"  Current Medications: Current Outpatient Medications  Medication Sig Dispense Refill   cetirizine (ZYRTEC) 10 MG tablet Take 10 mg by mouth daily as needed for allergies.     docusate sodium (COLACE) 100 MG capsule Take 1 capsule (100 mg total) by mouth daily. (Patient taking differently: Take 100 mg by mouth 2 (two) times daily.) 30 capsule 1   famotidine (PEPCID) 20 MG tablet Take 20 mg by mouth 2 (two) times daily.     fluticasone (FLONASE) 50 MCG/ACT nasal spray Place 2 sprays into the nose daily as needed for allergies.      QUEtiapine (SEROQUEL XR) 300 MG 24 hr tablet Take 1 tablet (300 mg total) by mouth at  bedtime. 30 tablet 3   QUEtiapine (SEROQUEL XR) 50 MG TB24 24 hr tablet Take 1-2 tablets (50-100 mg total) by mouth as directed. Take 1 tablet ( 50 mg ) at bedtime for 1 week and increase to 2 tablets ( 100 mg ) after a week , take along with seroquel xr 300 mg at bedtime 60 tablet 1   traZODone (DESYREL) 100 MG tablet Take 1 tablet (100 mg total) by mouth at bedtime. 30 tablet 3   benztropine (COGENTIN) 1 MG tablet Take 1 tablet (1 mg total) by mouth daily. 30 tablet 3   fluPHENAZine (PROLIXIN) 10 MG tablet Take 1 tablet (10 mg total) by mouth daily after lunch. 30 tablet 3   No current facility-administered medications for this visit.  Musculoskeletal: Strength & Muscle Tone: within normal limits Gait & Station: normal Patient leans: N/A  Psychiatric Specialty Exam: Review of Systems  Psychiatric/Behavioral:  Positive for sleep disturbance.   All other systems reviewed and are negative.   Blood pressure 122/87, pulse 87, temperature 98.1 F (36.7 C), temperature source Temporal, weight 168 lb 12.8 oz (76.6 kg).Body mass index is 24.93 kg/m.  General Appearance: Casual  Eye Contact:  Fair  Speech:  Normal Rate  Volume:  Decreased  Mood:  Euthymic  Affect:  Congruent  Thought Process:  Goal Directed and Descriptions of Associations: Intact  Orientation:  Full (Time, Place, and Person)  Thought Content: Logical , not observed in session however mother does report patient having episodes of hallucinations, talking to himself.  Suicidal Thoughts:  No  Homicidal Thoughts:  No  Memory:  Immediate;   Fair Recent;   Fair Remote;   Poor  Judgement:  Fair  Insight:  Shallow  Psychomotor Activity:  Normal  Concentration:  Concentration: Fair and Attention Span: Fair  Recall:  [employer] of Knowledge: Poor  Language: Fair  Akathisia:  No  Handed:  Right  AIMS (if indicated): done  Assets:  Desire for Improvement Housing Social Support Transportation  ADL's:  Intact   Cognition: Baseline  Sleep:  Poor   Screenings: [facility] Office Visit from 01/21/2022 in [facility] Office Visit from 10/22/2021 in [facility] Visit from 09/09/2021 in [facility] Admission (Discharged) from 09/05/2017 in [facility] Total Score 0 0 0 0      AUDIT    Flowsheet Row Admission (Discharged) from 09/05/2017 in [facility]  Alcohol Use Disorder Identification Test Final Score (AUDIT) 0      GAD-7    Flowsheet Row Office Visit from 01/21/2022 in [facility] Office Visit from 08/21/2021 in [facility]  Total GAD-7 Score 0 0      PHQ2-9    [facility] Visit from 01/21/2022 in [facility] Visit from 10/22/2021 in [facility] Visit from 09/09/2021 in [facility] Visit from 08/21/2021 in [facility]  PHQ-2 Total Score 0 0 0 0  PHQ-9 Total Score -- 0 0 0      [facility] Visit from 10/22/2021 in [facility] from 08/21/2021 in [facility] Admission (Discharged) from 03/18/2021 in [facility] No Risk No Risk No Risk        Assessment and Plan: Carl Carl [name]. Is a 51 year old African-American male on disability, has a history of schizophrenia, multiple antipsychotic medications with chronic auditory hallucination, failed trials of Clozaril(developed side effects) presented for medication management.  Patient on 3 antipsychotic medication is currently being tapered off of the risperidone, cross titrating with Seroquel.  Will benefit from the following plan.  Plan Schizophrenia-unstable Prolixin 10  mg p.o. daily after lunch Increase Seroquel extended release to 350 mg p.o. nightly for 1 week and then to 400 mg p.o. nightly after that. Discontinue risperidone. Continue trazodone 100 mg p.o. nightly as needed  Collateral information obtained from mother-as noted above.  Follow-up in clinic in 4 weeks or sooner if needed.   This note was generated in part or whole with voice recognition software. Voice recognition is usually quite accurate but  there are transcription errors that can and very often do occur. I apologize for any typographical errors that were not detected and corrected.      Carl Alert, MD 01/22/2022, 8:15 AM

## 2022-01-21 NOTE — Patient Instructions (Signed)
STOP RISPERIDONE  Start taking Seroquel XR 50 mg daily at bedtime along with Seroquel XR 300 mg at bedtime for 1 week and increase to seroquel XR 100 mg , along with 300 mg after that.   Continue all other medications.

## 2022-03-02 ENCOUNTER — Ambulatory Visit (INDEPENDENT_AMBULATORY_CARE_PROVIDER_SITE_OTHER): Payer: Medicare Other | Admitting: Psychiatry

## 2022-03-02 ENCOUNTER — Encounter: Payer: Self-pay | Admitting: Psychiatry

## 2022-03-02 VITALS — BP 116/81 | HR 98 | Temp 98.2°F | Wt 169.4 lb

## 2022-03-02 DIAGNOSIS — G4710 Hypersomnia, unspecified: Secondary | ICD-10-CM

## 2022-03-02 DIAGNOSIS — G479 Sleep disorder, unspecified: Secondary | ICD-10-CM | POA: Insufficient documentation

## 2022-03-02 DIAGNOSIS — F203 Undifferentiated schizophrenia: Secondary | ICD-10-CM

## 2022-03-02 MED ORDER — TRAZODONE HCL 100 MG PO TABS: 100.0000 mg | ORAL_TABLET | Freq: Every evening | ORAL | 3 refills | Status: DC | PRN

## 2022-03-02 MED ORDER — QUETIAPINE FUMARATE ER 50 MG PO TB24: 50.0000 mg | ORAL_TABLET | ORAL | 1 refills | Status: DC

## 2022-03-02 MED ORDER — FLUPHENAZINE HCL 5 MG PO TABS: 5.0000 mg | ORAL_TABLET | Freq: Every day | ORAL | 1 refills | Status: DC

## 2022-03-02 NOTE — Progress Notes (Signed)
[facility] MD OP Progress Note  03/02/2022 2:31 PM Carl [name].  MRN:  [id number]  Chief Complaint:  Chief Complaint  Patient presents with   Follow-up: 51 year old African-American male with history of schizophrenia, presented for medication management.   HPI: Carl [name]. Is a 51 year old African-American male, single, lives in [place], has a history of schizophrenia, on multiple psychotropic medications, presented for medication management.  Collateral information obtained from mother-[name].  Patient today appeared to be cooperative, pleasant, alert oriented to person place time situation.  Did not appear to be responding to internal stimuli.  Patient did not appear to be preoccupied with any delusions or paranoia.  Reports he does have auditory hallucinations however these are chronic and it does not bother him much.  He could not elaborate further.  Per mother patient has been compliant on his medications as prescribed.  He however seems to sleep a lot, does not have a good sleep hygiene.  Goes to bed at around 2 AM and wakes up the next day around 3 PM.  Patient hence takes his Prolixin which he is supposed to take after lunch later on at around 5 or 6 PM since that also seems to make him more drowsy.  She has not noticed any worsening mood symptoms or hallucinations since patient has been off the risperidone.  Patient was on 3 antipsychotics and is currently being tapered down to monotherapy.  Patient denies suicidality, homicidality.  Patient otherwise denies any other concerns.  Mother also denies any other concerns.      Visit Diagnosis:    ICD-10-CM   1. 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 Hypersomnia  G47.10    unspecified      Past Psychiatric History: Reviewed past psychiatric history from progress note on 09/09/2021.  Reviewed  past psychiatric history from progress note on 08/21/2021.  Past Medical History:  Past Medical History:  Diagnosis Date   Abdominal pain    Anxiety    Constipation    Decreased visual acuity    Nevus    Polyuria    Recurrent boils    scalp behind ear   Schizophrenia (HCC)    Seizures ([place]) 08/15/1990   due to haldol   Soft tissue mass    left scalp posterior    Past Surgical History:  Procedure Laterality Date   COLONOSCOPY WITH PROPOFOL N/A 02/14/2020   Procedure: COLONOSCOPY WITH PROPOFOL;  Surgeon: Virgel Manifold, MD;  Location: [facility];  Service: Endoscopy;  Laterality: N/A;   COLONOSCOPY WITH PROPOFOL N/A 02/28/2020   Procedure: COLONOSCOPY WITH PROPOFOL;  Surgeon: Virgel Manifold, MD;  Location: [facility];  Service: Endoscopy;  Laterality: N/A;   COLONOSCOPY WITH PROPOFOL N/A 03/18/2021   Procedure: COLONOSCOPY WITH PROPOFOL;  Surgeon: Virgel Manifold, MD;  Location: [facility];  Service: Endoscopy;  Laterality: N/A;   LAPAROSCOPIC RIGHT COLECTOMY Right 03/11/2020   Procedure: LAPAROSCOPIC RIGHT COLECTOMY, possible open;  Surgeon: Olean Ree, MD;  Location: [facility];  Service: General;  Laterality: Right;   MASS EXCISION Left 02/11/2015   Procedure: EXCISION MASS, left post neck;  Surgeon: Florene Glen, MD;  Location: [facility];  Service: General;  Laterality: Left;    Family Psychiatric History: Reviewed family psychiatric history from progress note on 08/21/2021.  Family History:  Family History  Problem  Relation Age of Onset   Hyperlipidemia Mother    Hypertension Mother    Fibromyalgia Mother    Hypertension Father    Diabetes Father    Hyperlipidemia Father    Mental illness Neg Hx     Social History: Reviewed social history from progress note on 08/21/2021. Social History   Socioeconomic History   Marital status: Single    Spouse name: Not on file   Number of children: Not on file   Years of education: Not on  file   Highest education level: 12th grade  Occupational History   Occupation: on disability  Tobacco Use   Smoking status: Never   Smokeless tobacco: Never  Vaping Use   Vaping Use: Never used  Substance and Sexual Activity   Alcohol use: No    Alcohol/week: 0.0 standard drinks of alcohol   Drug use: No   Sexual activity: Not Currently  Other Topics Concern   Not on file  Social History Narrative   Not on file   Social Determinants of Health   Financial Resource Strain: Not on file  Food Insecurity: Not on file  Transportation Needs: Not on file  Physical Activity: Not on file  Stress: Not on file  Social Connections: Not on file    Allergies: No Known Allergies  Metabolic Disorder Labs: Lab Results  Component Value Date   HGBA1C 5.5 08/24/2021   MPG 111 08/24/2021   MPG 102.54 09/06/2017   Lab Results  Component Value Date   PROLACTIN 41.2 (H) 08/24/2021   Lab Results  Component Value Date   CHOL 214 (H) 08/24/2021   TRIG 103 08/24/2021   HDL 44 08/24/2021   CHOLHDL 4.9 08/24/2021   VLDL 21 08/24/2021   LDLCALC 149 (H) 08/24/2021   LDLCALC 102 (H) 09/06/2017   Lab Results  Component Value Date   TSH 1.047 08/24/2021   TSH 1.247 09/06/2017    Therapeutic Level Labs: No results found for: "LITHIUM" Lab Results  Component Value Date   VALPROATE 97 04/28/2019   VALPROATE 99 09/09/2017   No results found for: "CBMZ"  Current Medications: Current Outpatient Medications  Medication Sig Dispense Refill   benztropine (COGENTIN) 1 MG tablet Take 1 tablet (1 mg total) by mouth daily. 30 tablet 3   cetirizine (ZYRTEC) 10 MG tablet Take 10 mg by mouth daily as needed for allergies.     docusate sodium (COLACE) 100 MG capsule Take 1 capsule (100 mg total) by mouth daily. (Patient taking differently: Take 100 mg by mouth 2 (two) times daily.) 30 capsule 1   famotidine (PEPCID) 20 MG tablet Take 20 mg by mouth 2 (two) times daily.     fluPHENAZine  (PROLIXIN) 5 MG tablet Take 1 tablet (5 mg total) by mouth daily after lunch. Stop Fluphenazine 10 mg 30 tablet 1   fluticasone (FLONASE) 50 MCG/ACT nasal spray Place 2 sprays into the nose daily as needed for allergies.      QUEtiapine (SEROQUEL XR) 300 MG 24 hr tablet Take 1 tablet (300 mg total) by mouth at bedtime. 30 tablet 3   QUEtiapine (SEROQUEL XR) 50 MG TB24 24 hr tablet Take 1-2 tablets (50-100 mg total) by mouth as directed. Take 1 tablet ( 50 mg ) at bedtime for 1 week and increase to 2 tablets ( 100 mg ) after a week , take along with seroquel xr 300 mg at bedtime 60 tablet 1   traZODone (DESYREL) 100 MG tablet Take 1 tablet (  100 mg total) by mouth at bedtime as needed for sleep. Use only as needed for sleep 30 tablet 3   No current facility-administered medications for this visit.     Musculoskeletal: Strength & Muscle Tone: within normal limits Gait & Station: normal Patient leans: N/A  Psychiatric Specialty Exam: Review of Systems  Psychiatric/Behavioral:  Positive for hallucinations and sleep disturbance.   All other systems reviewed and are negative.   Blood pressure 116/81, pulse 98, temperature 98.2 F (36.8 C), temperature source Temporal, weight 169 lb 6.4 oz (76.8 kg).Body mass index is 25.02 kg/m.  General Appearance: Casual  Eye Contact:  Fair  Speech:  Clear and Coherent  Volume:  Normal  Mood:  Euthymic  Affect:  Congruent  Thought Process:  Goal Directed and Descriptions of Associations: Intact  Orientation:  Full (Time, Place, and Person)  Thought Content: Hallucinations: Auditory chronic, did not elaborate further  Suicidal Thoughts:  No  Homicidal Thoughts:  No  Memory:  Immediate;   Fair Recent;   Fair Remote;   Poor  Judgement:  Fair  Insight:  Shallow  Psychomotor Activity:  Normal  Concentration:  Concentration: Fair and Attention Span: Fair  Recall:  [employer] of Knowledge: Fair  Language: Fair  Akathisia:  No  Handed:  Right  AIMS  (if indicated): done  Assets:  Communication Skills Desire for Improvement Housing Social Support Transportation  ADL's:  Intact  Cognition: WNL  Sleep:   Excessive   Screenings: [place] Office Visit from 03/02/2022 in [place] Visit from 01/21/2022 in [place] Visit from 10/22/2021 in [place] Visit from 09/09/2021 in [place] Admission (Discharged) from 09/05/2017 in [place] Total Score 0 0 0 0 0      AUDIT    Flowsheet Row Admission (Discharged) from 09/05/2017 in [place]  Alcohol Use Disorder Identification Test Final Score (AUDIT) 0      GAD-7    Flowsheet Row Office Visit from 03/02/2022 in [place] Office Visit from 01/21/2022 in [place] Office Visit from 08/21/2021 in [place]  Total GAD-7 Score 0 0 0      PHQ2-9    [place] Visit from 03/02/2022 in [place] Visit from 01/21/2022 in [place] from 10/22/2021 in [place] Visit from 09/09/2021 in [place] Visit from 08/21/2021 in [place]  PHQ-2 Total Score 0 0 0 0 0  PHQ-9 Total Score -- -- 0 0 [place] Visit from 03/02/2022 in [place] Visit from 10/22/2021 in [place] Office Visit from 08/21/2021 in [place] No Risk No Risk No Risk        Assessment and Plan: Carl [name]. Is a 51 year old African-American male on disability, has a history of schizophrenia, multiple  antipsychotic medications with chronic auditory hallucinations, failed trials of Clozaril(developed side effects), presented for medication management.  Patient is currently off of the risperidone continues to have hypersomnia, will benefit from the following plan.  Plan Schizophrenia-improving Reduce Prolixin to 5 mg p.o. daily after lunch Continue Seroquel extended release to 400 mg p.o. nightly. Continue trazodone 100 mg p.o. nightly as needed  Hypersomnia-unstable Discussed sleep hygiene techniques. Discussed with patient to have a set bedtime and a wake-up time. Advised to use the trazodone only as needed.  Will continue to monitor and reevaluate in future sessions.  Collateral information obtained from mother as noted above.  Follow-up in clinic in 8 to 10 weeks or sooner if needed.  This note was generated in part or whole with voice recognition software. Voice recognition is usually quite accurate but there are transcription errors that can and very often do occur. I apologize for any typographical errors that were not detected and corrected.     Ursula Alert, MD 03/02/2022, 2:31 PM

## 2022-04-12 ENCOUNTER — Other Ambulatory Visit: Payer: Self-pay

## 2022-04-12 DIAGNOSIS — C189 Malignant neoplasm of colon, unspecified: Secondary | ICD-10-CM

## 2022-04-13 ENCOUNTER — Encounter: Payer: Self-pay | Admitting: Oncology

## 2022-04-13 ENCOUNTER — Inpatient Hospital Stay (HOSPITAL_BASED_OUTPATIENT_CLINIC_OR_DEPARTMENT_OTHER): Payer: Medicare Other | Admitting: Oncology

## 2022-04-13 ENCOUNTER — Inpatient Hospital Stay: Payer: Medicare Other | Attending: Oncology

## 2022-04-13 VITALS — BP 109/87 | HR 98 | Resp 18 | Wt 166.0 lb

## 2022-04-13 DIAGNOSIS — Z9049 Acquired absence of other specified parts of digestive tract: Secondary | ICD-10-CM | POA: Insufficient documentation

## 2022-04-13 DIAGNOSIS — Z79899 Other long term (current) drug therapy: Secondary | ICD-10-CM | POA: Insufficient documentation

## 2022-04-13 DIAGNOSIS — R718 Other abnormality of red blood cells: Secondary | ICD-10-CM | POA: Diagnosis not present

## 2022-04-13 DIAGNOSIS — F209 Schizophrenia, unspecified: Secondary | ICD-10-CM | POA: Diagnosis not present

## 2022-04-13 DIAGNOSIS — C189 Malignant neoplasm of colon, unspecified: Secondary | ICD-10-CM

## 2022-04-13 DIAGNOSIS — Z809 Family history of malignant neoplasm, unspecified: Secondary | ICD-10-CM

## 2022-04-13 DIAGNOSIS — Z85038 Personal history of other malignant neoplasm of large intestine: Secondary | ICD-10-CM | POA: Diagnosis present

## 2022-04-13 LAB — COMPREHENSIVE METABOLIC PANEL
ALT: 11 U/L (ref 0–44)
AST: 14 U/L — ABNORMAL LOW (ref 15–41)
Albumin: 3.9 g/dL (ref 3.5–5.0)
Alkaline Phosphatase: 52 U/L (ref 38–126)
Anion gap: 6 (ref 5–15)
BUN: 14 mg/dL (ref 6–20)
CO2: 27 mmol/L (ref 22–32)
Calcium: 8.6 mg/dL — ABNORMAL LOW (ref 8.9–10.3)
Chloride: 104 mmol/L (ref 98–111)
Creatinine, Ser: 1.52 mg/dL — ABNORMAL HIGH (ref 0.61–1.24)
GFR, Estimated: 55 mL/min — ABNORMAL LOW (ref >60)
Glucose, Bld: 112 mg/dL — ABNORMAL HIGH (ref 70–99)
Potassium: 3.4 mmol/L — ABNORMAL LOW (ref 3.5–5.1)
Sodium: 137 mmol/L (ref 135–145)
Total Bilirubin: 0.5 mg/dL (ref 0.3–1.2)
Total Protein: 6.8 g/dL (ref 6.5–8.1)

## 2022-04-13 LAB — CBC WITH DIFFERENTIAL/PLATELET
Abs Immature Granulocytes: 0 10*3/uL (ref 0.00–0.07)
Basophils Absolute: 0 10*3/uL (ref 0.0–0.1)
Basophils Relative: 0 %
Eosinophils Absolute: 0.1 10*3/uL (ref 0.0–0.5)
Eosinophils Relative: 2 %
HCT: 41.3 % (ref 39.0–52.0)
Hemoglobin: 14.4 g/dL (ref 13.0–17.0)
Immature Granulocytes: 0 %
Lymphocytes Relative: 46 %
Lymphs Abs: 2.1 10*3/uL (ref 0.7–4.0)
MCH: 27.5 pg (ref 26.0–34.0)
MCHC: 34.9 g/dL (ref 30.0–36.0)
MCV: 78.8 fL — ABNORMAL LOW (ref 80.0–100.0)
Monocytes Absolute: 0.5 10*3/uL (ref 0.1–1.0)
Monocytes Relative: 10 %
Neutro Abs: 1.9 10*3/uL (ref 1.7–7.7)
Neutrophils Relative %: 42 %
Platelets: 261 10*3/uL (ref 150–400)
RBC: 5.24 MIL/uL (ref 4.22–5.81)
RDW: 13 % (ref 11.5–15.5)
WBC: 4.5 10*3/uL (ref 4.0–10.5)
nRBC: 0 % (ref 0.0–0.2)

## 2022-04-13 NOTE — Assessment & Plan Note (Signed)
Patient was previously referred to genetic counseling and canceled his appointment.  Today we discussed again and the patient is motivated and wants to reschedule. Refer to genetic counseling

## 2022-04-13 NOTE — Progress Notes (Signed)
Patient reports cough and chest pain that comes and goes

## 2022-04-13 NOTE — Progress Notes (Signed)
Hematology/Oncology Progress note [phone] [phone]      Patient Care Team: Donnie Coffin, MD as PCP - General (Family Medicine) Earlie Server, MD as Consulting Physician (Oncology) Olean Ree, MD as Consulting Physician (General Surgery) Clent Jacks, RN as Oncology Nurse Navigator  ASSESSMENT & PLAN:   Malignant neoplasm of colon [facility]) #Stage I colon cancer status post right hemicolectomy. Labs are reviewed and discussed with patient. CEA is normal and stable.  Clinically he is doing well.  Recommend follow up in 1 year  Family history of cancer Patient was previously referred to genetic counseling and canceled his appointment.  Today we discussed again and the patient is motivated and wants to reschedule. Refer to genetic counseling  Microcytosis Check iron panel.   Orders Placed This Encounter  Procedures   CBC with Differential/Platelet    Standing Status:   Future    Standing Expiration Date:   04/14/2023   Comprehensive metabolic panel    Standing Status:   Future    Standing Expiration Date:   04/14/2023   CEA    Standing Status:   Future    Standing Expiration Date:   04/14/2023   Iron and TIBC    Standing Status:   Future    Standing Expiration Date:   04/14/2023   Ferritin    Standing Status:   Future    Standing Expiration Date:   04/14/2023   Iron and TIBC    Standing Status:   Future    Standing Expiration Date:   04/14/2023   Ferritin    Standing Status:   Future    Standing Expiration Date:   04/14/2023   Ambulatory referral to Genetics    Referral Priority:   Routine    Referral Type:   Consultation    Referral Reason:   Specialty Services Required    Number of Visits Requested:   1   Follow up in 12 months.  All questions were answered. The patient knows to call the clinic with any problems, questions or concerns.  Earlie Server, MD, PhD [facility] Hematology Oncology 04/13/2022   CHIEF COMPLAINTS/REASON FOR  VISIT:  Follow-up colon cancer  HISTORY OF PRESENTING ILLNESS:   Carl [name]. is a  51 y.o.  male with PMH listed below was seen in consultation at the request of  Donnie Coffin, MD  for evaluation of colon cancer  01/26/2020, CT abdomen pelvis at [facility] showed intussusception of the large bowel at the hepatic flexure with irregular bowel wall thickening. Patient established care with gastroenterology and had colonoscopy on 02/14/2020 which showed 5 to 7 mm polyps in ascending colon and in the cecum.  Resected and retrieved.  5 mm polyp in the ascending colon, resected and retrieved. There is a tumor found at 60 cm proximal to the anus.  Biopsied.  Pathology showed tubular adenoma x2, tubulovillous adenoma x1, 60 cm to anus biopsy showed villous adenoma, definitive high-grade dysplasia is not identified. 02/28/2020, patient had another colonoscopy to place the tatoo and bed placement.  Colonoscopy showed large polyp found in the hepatic flexure.  Polyp was multilobulated.   5 mm polyp in the sigmoid colon, resected and retrieved.  Pathology showed serrated polyp negative for dysplasia and malignancy.  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  All margins were negative for invasive  carcinoma.  No lymphovascular invasion or perineural invasion was identified. pT1 pN0.  MLH1, [place] 2, PMS2 intact, [place] 6 heterogeneous staining.  Negative hypermethylation of the MLH1 gene promoter  Patient referred to oncology to discuss about diagnosis and management plan. Patient is accompanied by his mother. Denies any family history of colon cancer.  Family history is positive for maternal uncle who was diagnosed with prostate cancer.  Maternal great aunt was diagnosed with breast cancer. Patient has no new  complaints.  He has history of schizophrenia and seizure.   1 year post op colonoscopy on 03/18/2021 Small polyps at anastomosis, resected- pathology at tubular adenoma.  INTERVAL HISTORY Carl [name]. is a 51 y.o. male who has above history reviewed by me today presents for follow up visit for management of stage I colon cancer Problems and complaints are listed below: Patient was accompanied by his father  He is a poor historian. Occasionally he has cough and chest pain. No symptom currently.  He denies any abdominal pain,bowel habit changes, black or bloody bowel movements.  Weight is stable.   Review of Systems  Constitutional:  Negative for appetite change, chills, fatigue, fever and unexpected weight change.  HENT:   Negative for hearing loss and voice change.   Eyes:  Negative for eye problems and icterus.  Respiratory:  Negative for chest tightness and shortness of breath.        Occasional cough  Cardiovascular:  Negative for chest pain and leg swelling.  Gastrointestinal:  Negative for abdominal distention and abdominal pain.  Endocrine: Negative for hot flashes.  Genitourinary:  Negative for difficulty urinating, dysuria and frequency.   Musculoskeletal:  Negative for arthralgias.  Skin:  Negative for itching and rash.  Neurological:  Negative for light-headedness and numbness.  Hematological:  Negative for adenopathy. Does not bruise/bleed easily.  Psychiatric/Behavioral:  Negative for confusion.     MEDICAL HISTORY:  Past Medical History:  Diagnosis Date   Abdominal pain    Anxiety    Constipation    Decreased visual acuity    Nevus    Polyuria    Recurrent boils    scalp behind ear   Schizophrenia (HCC)    Seizures ([place]) 08/15/1990   due to haldol   Soft tissue mass    left scalp posterior    SURGICAL HISTORY: Past Surgical History:  Procedure Laterality Date   COLONOSCOPY WITH PROPOFOL N/A 02/14/2020   Procedure: COLONOSCOPY WITH PROPOFOL;   Surgeon: Virgel Manifold, MD;  Location: [facility];  Service: Endoscopy;  Laterality: N/A;   COLONOSCOPY WITH PROPOFOL N/A 02/28/2020   Procedure: COLONOSCOPY WITH PROPOFOL;  Surgeon: Virgel Manifold, MD;  Location: [facility];  Service: Endoscopy;  Laterality: N/A;   COLONOSCOPY WITH PROPOFOL N/A 03/18/2021   Procedure: COLONOSCOPY WITH PROPOFOL;  Surgeon: Virgel Manifold, MD;  Location: [facility];  Service: Endoscopy;  Laterality: N/A;   LAPAROSCOPIC RIGHT COLECTOMY Right 03/11/2020   Procedure: LAPAROSCOPIC RIGHT COLECTOMY, possible open;  Surgeon: Olean Ree, MD;  Location: [facility];  Service: General;  Laterality: Right;   MASS EXCISION Left 02/11/2015   Procedure: EXCISION MASS, left post neck;  Surgeon: Florene Glen, MD;  Location: [place] [facility];  Service: General;  Laterality: Left;    SOCIAL HISTORY: Social History   Socioeconomic History   Marital status: Single    Spouse name: Not on file   Number of children: Not on file   Years of education: Not on file  Highest education level: 12th grade  Occupational History   Occupation: on disability  Tobacco Use   Smoking status: Never   Smokeless tobacco: Never  Vaping Use   Vaping Use: Never used  Substance and Sexual Activity   Alcohol use: No    Alcohol/week: 0.0 standard drinks of alcohol   Drug use: No   Sexual activity: Not Currently  Other Topics Concern   Not on file  Social History Narrative   Not on file   Social Determinants of Health   Financial Resource Strain: Not on file  Food Insecurity: Not on file  Transportation Needs: Not on file  Physical Activity: Not on file  Stress: Not on file  Social Connections: Not on file  Intimate Partner Violence: Not on file    FAMILY HISTORY: Family History  Problem Relation Age of Onset   Hyperlipidemia Mother    Hypertension Mother    Fibromyalgia Mother    Hypertension Father    Diabetes Father    Hyperlipidemia Father     Mental illness Neg Hx     ALLERGIES:  has No Known Allergies.  MEDICATIONS:  Current Outpatient Medications  Medication Sig Dispense Refill   benztropine (COGENTIN) 1 MG tablet Take 1 tablet (1 mg total) by mouth daily. 30 tablet 3   cetirizine (ZYRTEC) 10 MG tablet Take 10 mg by mouth daily as needed for allergies.     docusate sodium (COLACE) 100 MG capsule Take 1 capsule (100 mg total) by mouth daily. (Patient taking differently: Take 100 mg by mouth 2 (two) times daily.) 30 capsule 1   famotidine (PEPCID) 20 MG tablet Take 20 mg by mouth 2 (two) times daily.     fluPHENAZine (PROLIXIN) 5 MG tablet Take 1 tablet (5 mg total) by mouth daily after lunch. Stop Fluphenazine 10 mg 30 tablet 1   fluticasone (FLONASE) 50 MCG/ACT nasal spray Place 2 sprays into the nose daily as needed for allergies.      QUEtiapine (SEROQUEL XR) 300 MG 24 hr tablet Take 1 tablet (300 mg total) by mouth at bedtime. 30 tablet 3   QUEtiapine (SEROQUEL XR) 50 MG TB24 24 hr tablet Take 1-2 tablets (50-100 mg total) by mouth as directed. Take 1 tablet ( 50 mg ) at bedtime for 1 week and increase to 2 tablets ( 100 mg ) after a week , take along with seroquel xr 300 mg at bedtime 60 tablet 1   traZODone (DESYREL) 100 MG tablet Take 1 tablet (100 mg total) by mouth at bedtime as needed for sleep. Use only as needed for sleep 30 tablet 3   No current facility-administered medications for this visit.     PHYSICAL EXAMINATION: ECOG PERFORMANCE STATUS: 0 - Asymptomatic Vitals:   04/13/22 1320  BP: 109/87  Pulse: 98  Resp: 18  SpO2: 97%   Filed Weights   04/13/22 1318  Weight: 166 lb (75.3 kg)    Physical Exam Constitutional:      General: He is not in acute distress. HENT:     Head: Normocephalic and atraumatic.  Eyes:     General: No scleral icterus. Cardiovascular:     Rate and Rhythm: Normal rate and regular rhythm.     Heart sounds: Normal heart sounds.  Pulmonary:     Effort: Pulmonary effort  is normal. No respiratory distress.     Breath sounds: No wheezing.  Abdominal:     General: Bowel sounds are normal. There is no distension.  Palpations: Abdomen is soft.  Musculoskeletal:        General: No deformity. Normal range of motion.     Cervical back: Normal range of motion and neck supple.  Skin:    General: Skin is warm and dry.     Findings: No erythema or rash.  Neurological:     Mental Status: He is alert. Mental status is at baseline.     Cranial Nerves: No cranial nerve deficit.     Coordination: Coordination normal.  Psychiatric:        Mood and Affect: Mood normal.     LABORATORY DATA:  I have reviewed the data as listed     Latest Ref Rng & Units 04/13/2022   11:25 AM 04/01/2021    1:14 PM 09/30/2020    1:58 PM  CBC  WBC 4.0 - 10.5 K/uL 4.5  5.6  4.5   Hemoglobin 13.0 - 17.0 g/dL 14.4  14.4  14.5   Hematocrit 39.0 - 52.0 % 41.3  41.6  42.1   Platelets 150 - 400 K/uL 261  277  246       Latest Ref Rng & Units 04/13/2022   11:25 AM 08/24/2021    3:00 PM 04/01/2021    1:14 PM  CMP  Glucose 70 - 99 mg/dL 112   99   BUN 6 - 20 mg/dL _0 Creatinine 0.61 - 1.24 mg/dL 1.52  1.21  1.27   Sodium 135 - 145 mmol/L 137  137  136   Potassium 3.5 - 5.1 mmol/L 3.4   3.8   Chloride 98 - 111 mmol/L 104   103   CO2 22 - 32 mmol/L 27   27   Calcium 8.9 - 10.3 mg/dL 8.6   9.0   Total Protein 6.5 - 8.1 g/dL 6.8  7.0  7.1   Total Bilirubin 0.3 - 1.2 mg/dL 0.5  0.7  0.6   Alkaline Phos 38 - 126 U/L 52  47  57   AST 15 - 41 U/L _1 ALT 0 - 44 U/L _2 RADIOGRAPHIC STUDIES: I have personally reviewed the radiological images as listed and agreed with the findings in the report. No results found.

## 2022-04-13 NOTE — Assessment & Plan Note (Addendum)
#  Stage I colon cancer status post right hemicolectomy. Labs are reviewed and discussed with patient. CEA is normal and stable.  Clinically he is doing well.  Recommend follow up in 1 year

## 2022-04-13 NOTE — Assessment & Plan Note (Signed)
Check iron panel. 

## 2022-04-14 ENCOUNTER — Other Ambulatory Visit: Payer: Self-pay

## 2022-04-14 DIAGNOSIS — C189 Malignant neoplasm of colon, unspecified: Secondary | ICD-10-CM

## 2022-04-14 LAB — IRON AND TIBC
Iron: 63 ug/dL (ref 45–182)
Saturation Ratios: 24 % (ref 17.9–39.5)
TIBC: 265 ug/dL (ref 250–450)
UIBC: 202 ug/dL

## 2022-04-14 LAB — FERRITIN: Ferritin: 89 ng/mL (ref 24–336)

## 2022-04-15 LAB — CEA: CEA: 2.3 ng/mL (ref 0.0–4.7)

## 2022-04-27 ENCOUNTER — Telehealth: Payer: Self-pay | Admitting: Licensed Clinical Social Worker

## 2022-04-27 ENCOUNTER — Encounter: Payer: Self-pay | Admitting: Licensed Clinical Social Worker

## 2022-04-27 NOTE — Telephone Encounter (Signed)
Attempted to reach Carl [name] to reschedule his genetic counseling appointment and let him know I need to cancel the appointment he has scheduled on 10/26 as I will not be here that day. Was unable to leave a voicemail. Sent patient a Therapist, music.

## 2022-04-28 ENCOUNTER — Telehealth: Payer: Self-pay | Admitting: Licensed Clinical Social Worker

## 2022-04-28 NOTE — Telephone Encounter (Signed)
Spoke with Carl [name] to inform him of his cancelled genetic counseling appointment tomorrow. He said he will call us back to reschedule.

## 2022-04-29 ENCOUNTER — Inpatient Hospital Stay: Payer: Medicare Other | Admitting: Licensed Clinical Social Worker

## 2022-04-29 ENCOUNTER — Inpatient Hospital Stay: Payer: Medicare Other

## 2022-05-18 ENCOUNTER — Ambulatory Visit (INDEPENDENT_AMBULATORY_CARE_PROVIDER_SITE_OTHER): Payer: Medicare Other | Admitting: Psychiatry

## 2022-05-18 ENCOUNTER — Encounter: Payer: Self-pay | Admitting: Psychiatry

## 2022-05-18 VITALS — BP 111/70 | HR 89 | Temp 97.9°F | Ht 69.0 in | Wt 165.0 lb

## 2022-05-18 DIAGNOSIS — G4710 Hypersomnia, unspecified: Secondary | ICD-10-CM

## 2022-05-18 DIAGNOSIS — F203 Undifferentiated schizophrenia: Secondary | ICD-10-CM

## 2022-05-18 MED ORDER — QUETIAPINE FUMARATE ER 50 MG PO TB24: 100.0000 mg | ORAL_TABLET | Freq: Every day | ORAL | 2 refills | Status: DC

## 2022-05-18 MED ORDER — QUETIAPINE FUMARATE ER 300 MG PO TB24: 300.0000 mg | ORAL_TABLET | Freq: Every day | ORAL | 3 refills | Status: DC

## 2022-05-18 MED ORDER — TRAZODONE HCL 100 MG PO TABS: 100.0000 mg | ORAL_TABLET | Freq: Every evening | ORAL | 3 refills | Status: DC | PRN

## 2022-05-18 MED ORDER — FLUPHENAZINE HCL 2.5 MG PO TABS
2.5000 mg | ORAL_TABLET | Freq: Every day | ORAL | 2 refills | Status: DC
Start: 2022-05-18 — End: 2022-08-24

## 2022-05-18 MED ORDER — BENZTROPINE MESYLATE 1 MG PO TABS: 1.0000 mg | ORAL_TABLET | Freq: Every day | ORAL | 3 refills | Status: DC

## 2022-05-18 NOTE — Progress Notes (Unsigned)
BH MD/PA/NP OP Progress Note  05/18/2022 1:57 PM Carl [name].  MRN:  [id number]  Chief Complaint:  Chief Complaint  Patient presents with   Follow-up   Medication Refill   Anxiety   HPI: Carl [name]. is a 51 year old African-American male, single, lives in [place], has a history of schizophrenia, on multiple psychotropic medications, presented for medication management.  Collateral information obtained from mother-Carl [name].  Patient today appeared to be cooperative, able to answer questions although in short phrases.  Patient appeared to be alert, oriented to person place time situation.  Reports he continues to be improving on the current medication regimen.  Does have chronic auditory hallucinations although it does not bother him.  Patient is currently tolerating the lower dose of fluphenazine as well as being off of the risperidone.  Seroquel dosage was readjusted last visit and reports that has been beneficial.  He does struggle with sleep when he does not take the trazodone.  Agreeable to start taking trazodone every night.  Trying to stay awake during the day, currently reports he distracts himself by going for walks, raking leaves and so on.  Patient denies any suicidality or homicidality.  Patient with immediate memory-within normal limits-3 out of 3, after 5 minutes 2 out of 3.  Patient was able to spell the word ' world" forward and backward.   Per collateral information from mother patient doing well.  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 Hypersomnia  G47.10       Past Psychiatric History: Reviewed past psych history from progress note on 09/09/2021.  Reviewed past psychiatric history from progress  note on 08/21/2021.  Past Medical History:  Past Medical History:  Diagnosis Date   Abdominal pain    Anxiety    Constipation    Decreased visual acuity    Nevus    Polyuria    Recurrent boils    scalp behind ear   Schizophrenia (HCC)    Seizures ([place]) 08/15/1990   due to haldol   Soft tissue mass    left scalp posterior    Past Surgical History:  Procedure Laterality Date   COLONOSCOPY WITH PROPOFOL N/A 02/14/2020   Procedure: COLONOSCOPY WITH PROPOFOL;  Surgeon: Virgel Manifold, MD;  Location: [facility];  Service: Endoscopy;  Laterality: N/A;   COLONOSCOPY WITH PROPOFOL N/A 02/28/2020   Procedure: COLONOSCOPY WITH PROPOFOL;  Surgeon: Virgel Manifold, MD;  Location: [facility];  Service: Endoscopy;  Laterality: N/A;   COLONOSCOPY WITH PROPOFOL N/A 03/18/2021   Procedure: COLONOSCOPY WITH PROPOFOL;  Surgeon: Virgel Manifold, MD;  Location: [facility];  Service: Endoscopy;  Laterality: N/A;   LAPAROSCOPIC RIGHT COLECTOMY Right 03/11/2020   Procedure: LAPAROSCOPIC RIGHT COLECTOMY, possible open;  Surgeon: Olean Ree, MD;  Location: [facility];  Service: General;  Laterality: Right;   MASS EXCISION Left 02/11/2015   Procedure: EXCISION MASS, left post neck;  Surgeon: Florene Glen, MD;  Location: [facility];  Service: General;  Laterality: Left;    Family Psychiatric History: Reviewed the psychiatric history from progress note on 08/21/2021.  Family History:  Family History  Problem Relation Age of Onset   Hyperlipidemia Mother  Hypertension Mother    Fibromyalgia Mother    Hypertension Father    Diabetes Father    Hyperlipidemia Father    Mental illness Neg Hx     Social History: Reviewed social history from progress note on 08/21/2021. Social History   Socioeconomic History   Marital status: Single    Spouse name: Not on file   Number of children: Not on file   Years of education: Not on file   Highest education level: 12th grade   Occupational History   Occupation: on disability  Tobacco Use   Smoking status: Never   Smokeless tobacco: Never  Vaping Use   Vaping Use: Never used  Substance and Sexual Activity   Alcohol use: No    Alcohol/week: 0.0 standard drinks of alcohol   Drug use: No   Sexual activity: Not Currently  Other Topics Concern   Not on file  Social History Narrative   Not on file   Social Determinants of Health   Financial Resource Strain: Not on file  Food Insecurity: Not on file  Transportation Needs: Not on file  Physical Activity: Not on file  Stress: Not on file  Social Connections: Not on file    Allergies: No Known Allergies  Metabolic Disorder Labs: Lab Results  Component Value Date   HGBA1C 5.5 08/24/2021   MPG 111 08/24/2021   MPG 102.54 09/06/2017   Lab Results  Component Value Date   PROLACTIN 41.2 (H) 08/24/2021   Lab Results  Component Value Date   CHOL 214 (H) 08/24/2021   TRIG 103 08/24/2021   HDL 44 08/24/2021   CHOLHDL 4.9 08/24/2021   VLDL 21 08/24/2021   LDLCALC 149 (H) 08/24/2021   LDLCALC 102 (H) 09/06/2017   Lab Results  Component Value Date   TSH 1.047 08/24/2021   TSH 1.247 09/06/2017    Therapeutic Level Labs: No results found for: "LITHIUM" Lab Results  Component Value Date   VALPROATE 97 04/28/2019   VALPROATE 99 09/09/2017   No results found for: "CBMZ"  Current Medications: Current Outpatient Medications  Medication Sig Dispense Refill   cetirizine (ZYRTEC) 10 MG tablet Take 10 mg by mouth daily as needed for allergies.     docusate sodium (COLACE) 100 MG capsule Take 1 capsule (100 mg total) by mouth daily. (Patient taking differently: Take 100 mg by mouth 2 (two) times daily.) 30 capsule 1   famotidine (PEPCID) 20 MG tablet Take 20 mg by mouth 2 (two) times daily.     fluPHENAZine (PROLIXIN) 2.5 MG tablet Take 1 tablet (2.5 mg total) by mouth daily after lunch. DOSE CHANGE , PLEASE STOP FLUPHENAZINE 5 MG 30 tablet 2    fluticasone (FLONASE) 50 MCG/ACT nasal spray Place 2 sprays into the nose daily as needed for allergies.      QUEtiapine (SEROQUEL XR) 50 MG TB24 24 hr tablet Take 2 tablets (100 mg total) by mouth at bedtime. Take along with 300 mg at bedtime, total of 400 mg . 60 tablet 2   benztropine (COGENTIN) 1 MG tablet Take 1 tablet (1 mg total) by mouth daily. 30 tablet 3   QUEtiapine (SEROQUEL XR) 300 MG 24 hr tablet Take 1 tablet (300 mg total) by mouth at bedtime. 30 tablet 3   traZODone (DESYREL) 100 MG tablet Take 1 tablet (100 mg total) by mouth at bedtime as needed for sleep. Use only as needed for sleep 30 tablet 3   No current facility-administered medications for this visit.  Musculoskeletal: Strength & Muscle Tone: within normal limits Gait & Station: normal Patient leans: N/A  Psychiatric Specialty Exam: Review of Systems  Psychiatric/Behavioral:  Positive for hallucinations.   All other systems reviewed and are negative.   Blood pressure 111/70, pulse 89, temperature 97.9 F (36.6 C), temperature source Oral, height '5\' 9"'$  (1.753 m), weight 165 lb (74.8 kg).Body mass index is 24.37 kg/m.  General Appearance: Casual  Eye Contact:  Fair  Speech:  Clear and Coherent  Volume:  Normal  Mood:  Euthymic  Affect:  Flat  Thought Process:  Goal Directed and Descriptions of Associations: Intact  Orientation:  Full (Time, Place, and Person)  Thought Content: Hallucinations: Auditory chronic  Suicidal Thoughts:  No  Homicidal Thoughts:  No  Memory:  Immediate;   Fair Recent;   Fair Remote;   Poor  Judgement:  Fair  Insight:  Fair  Psychomotor Activity:  Normal  Concentration:  Concentration: Fair and Attention Span: Fair  Recall:  [employer] of Knowledge: Fair  Language: Fair  Akathisia:  No  Handed:  Right  AIMS (if indicated): done  Assets:  Communication Skills Desire for Improvement Housing Social Support Transportation  ADL's:  Intact  Cognition: WNL  Sleep:  Fair    Screenings: [place] Office Visit from 05/18/2022 in [place] Office Visit from 03/02/2022 in [place] Visit from 01/21/2022 in [place] Visit from 10/22/2021 in [place] Visit from 09/09/2021 in [place] Total Score 0 0 0 0 0      AUDIT    Flowsheet Row Admission (Discharged) from 09/05/2017 in [place]  Alcohol Use Disorder Identification Test Final Score (AUDIT) 0      GAD-7    Flowsheet Row Office Visit from 05/18/2022 in [place] Office Visit from 03/02/2022 in [place] Visit from 01/21/2022 in [place] Office Visit from 08/21/2021 in [place]  Total GAD-7 Score 0 0 0 0      PHQ2-9    [place] Visit from 05/18/2022 in [place] Visit from 03/02/2022 in [place] Visit from 01/21/2022 in [place] from 10/22/2021 in [place] Visit from 09/09/2021 in [place]  PHQ-2 Total Score 0 0 0 0 0  PHQ-9 Total Score 0 -- -- 0 0      [place] Visit from 05/18/2022 in [place] Visit from 03/02/2022 in [place] Visit from 10/22/2021 in [place] No Risk No Risk No Risk        Assessment and Plan: Carl [name]. Is a 51 year old African-American male on disability, has a history of schizophrenia, multiple antipsychotic medications with chronic auditory hallucination, failed trials of Clozaril (developed  side effects) presented for medication management.  Patient currently tolerating being off of the risperidone as well as being on a lower dose to fluphenazine, will benefit from the following plan.  Plan Schizophrenia-stable Continue to taper off Prolixin, will reduce Prolixin to 2.5 mg p.o. daily after lunch Continue Seroquel extended release 400 mg p.o. nightly Trazodone 100 mg p.o. nightly as needed.  Hypersomnia-improving Patient to work on sleep hygiene techniques.  Collateral information obtained from mother as noted  above.  Follow-up in clinic in 3 months or sooner if needed.   This note was generated in part or whole with voice recognition software. Voice recognition is usually quite accurate but there are transcription errors that can and very often do occur. I apologize for any typographical errors that were not detected and corrected.     Ursula Alert, MD 05/18/2022, 1:57 PM

## 2022-08-17 ENCOUNTER — Telehealth: Payer: Self-pay

## 2022-08-17 ENCOUNTER — Ambulatory Visit (INDEPENDENT_AMBULATORY_CARE_PROVIDER_SITE_OTHER): Payer: 59 | Admitting: Psychiatry

## 2022-08-17 ENCOUNTER — Encounter: Payer: Self-pay | Admitting: Psychiatry

## 2022-08-17 ENCOUNTER — Emergency Department
Admission: EM | Admit: 2022-08-17 | Discharge: 2022-08-17 | Disposition: A | Discharge status: Other Acute Inpatient Hospital | Payer: 59 | Attending: Emergency Medicine | Admitting: Emergency Medicine

## 2022-08-17 VITALS — BP 115/77 | HR 93 | Temp 98.3°F | Ht 69.0 in | Wt 159.6 lb

## 2022-08-17 DIAGNOSIS — Z20822 Contact with and (suspected) exposure to covid-19: Secondary | ICD-10-CM | POA: Diagnosis not present

## 2022-08-17 DIAGNOSIS — F200 Paranoid schizophrenia: Secondary | ICD-10-CM

## 2022-08-17 DIAGNOSIS — R440 Auditory hallucinations: Secondary | ICD-10-CM | POA: Diagnosis present

## 2022-08-17 DIAGNOSIS — F203 Undifferentiated schizophrenia: Secondary | ICD-10-CM

## 2022-08-17 DIAGNOSIS — F209 Schizophrenia, unspecified: Secondary | ICD-10-CM | POA: Diagnosis present

## 2022-08-17 DIAGNOSIS — R451 Restlessness and agitation: Secondary | ICD-10-CM | POA: Insufficient documentation

## 2022-08-17 LAB — CBC
HCT: 42.8 % (ref 39.0–52.0)
Hemoglobin: 14.3 g/dL (ref 13.0–17.0)
MCH: 26.9 pg (ref 26.0–34.0)
MCHC: 33.4 g/dL (ref 30.0–36.0)
MCV: 80.5 fL (ref 80.0–100.0)
Platelets: 264 10*3/uL (ref 150–400)
RBC: 5.32 MIL/uL (ref 4.22–5.81)
RDW: 12.9 % (ref 11.5–15.5)
WBC: 4.1 10*3/uL (ref 4.0–10.5)
nRBC: 0 % (ref 0.0–0.2)

## 2022-08-17 LAB — URINE DRUG SCREEN, QUALITATIVE (ARMC ONLY)
Amphetamines, Ur Screen: NOT DETECTED
Barbiturates, Ur Screen: NOT DETECTED
Benzodiazepine, Ur Scrn: NOT DETECTED
Cannabinoid 50 Ng, Ur ~~LOC~~: NOT DETECTED
Cocaine Metabolite,Ur ~~LOC~~: NOT DETECTED
MDMA (Ecstasy)Ur Screen: NOT DETECTED
Methadone Scn, Ur: NOT DETECTED
Opiate, Ur Screen: NOT DETECTED
Phencyclidine (PCP) Ur S: NOT DETECTED
Tricyclic, Ur Screen: POSITIVE — AB

## 2022-08-17 LAB — RESP PANEL BY RT-PCR (RSV, FLU A&B, COVID)  RVPGX2
Influenza A by PCR: NEGATIVE
Influenza B by PCR: NEGATIVE
Resp Syncytial Virus by PCR: NEGATIVE
SARS Coronavirus 2 by RT PCR: NEGATIVE

## 2022-08-17 LAB — COMPREHENSIVE METABOLIC PANEL
ALT: 10 U/L (ref 0–44)
AST: 13 U/L — ABNORMAL LOW (ref 15–41)
Albumin: 4 g/dL (ref 3.5–5.0)
Alkaline Phosphatase: 49 U/L (ref 38–126)
Anion gap: 10 (ref 5–15)
BUN: 12 mg/dL (ref 6–20)
CO2: 24 mmol/L (ref 22–32)
Calcium: 9.3 mg/dL (ref 8.9–10.3)
Chloride: 102 mmol/L (ref 98–111)
Creatinine, Ser: 1.25 mg/dL — ABNORMAL HIGH (ref 0.61–1.24)
GFR, Estimated: >60 mL/min (ref >60)
Glucose, Bld: 101 mg/dL — ABNORMAL HIGH (ref 70–99)
Potassium: 3.7 mmol/L (ref 3.5–5.1)
Sodium: 136 mmol/L (ref 135–145)
Total Bilirubin: 0.6 mg/dL (ref 0.3–1.2)
Total Protein: 7 g/dL (ref 6.5–8.1)

## 2022-08-17 LAB — ETHANOL: Alcohol, Ethyl (B): <10 mg/dL (ref <10)

## 2022-08-17 LAB — SALICYLATE LEVEL: Salicylate Lvl: <7 mg/dL — ABNORMAL LOW (ref 7.0–30.0)

## 2022-08-17 LAB — ACETAMINOPHEN LEVEL: Acetaminophen (Tylenol), Serum: <10 ug/mL — ABNORMAL LOW (ref 10–30)

## 2022-08-17 MED ORDER — LORAZEPAM 1 MG PO TABS: 1.0000 mg | ORAL_TABLET | ORAL | Status: DC | PRN

## 2022-08-17 MED ORDER — TRAZODONE HCL 100 MG PO TABS: 100.0000 mg | ORAL_TABLET | Freq: Every evening | ORAL | Status: DC | PRN

## 2022-08-17 MED ORDER — BENZTROPINE MESYLATE 1 MG PO TABS
1.0000 mg | ORAL_TABLET | Freq: Every day | ORAL | Status: DC
  Administered 2022-08-17: 1 mg via ORAL
  Filled 2022-08-17: qty 1

## 2022-08-17 MED ORDER — FAMOTIDINE 20 MG PO TABS
20.0000 mg | ORAL_TABLET | Freq: Two times a day (BID) | ORAL | Status: DC
  Administered 2022-08-17 (×2): 20 mg via ORAL
  Filled 2022-08-17 (×2): qty 1

## 2022-08-17 MED ORDER — ACETAMINOPHEN 325 MG PO TABS: 650.0000 mg | ORAL_TABLET | ORAL | Status: DC | PRN

## 2022-08-17 MED ORDER — ZIPRASIDONE MESYLATE 20 MG IM SOLR: 20.0000 mg | INTRAMUSCULAR | Status: DC | PRN

## 2022-08-17 MED ORDER — RISPERIDONE 0.5 MG PO TBDP: 2.0000 mg | ORAL_TABLET | Freq: Three times a day (TID) | ORAL | Status: DC | PRN

## 2022-08-17 MED ORDER — LORATADINE 10 MG PO TABS
10.0000 mg | ORAL_TABLET | Freq: Every day | ORAL | Status: DC
  Administered 2022-08-17: 10 mg via ORAL
  Filled 2022-08-17: qty 1

## 2022-08-17 MED ORDER — QUETIAPINE FUMARATE ER 300 MG PO TB24
300.0000 mg | ORAL_TABLET | Freq: Every day | ORAL | Status: DC
  Administered 2022-08-17: 300 mg via ORAL
  Filled 2022-08-17 (×2): qty 1

## 2022-08-17 MED ORDER — FLUPHENAZINE HCL 5 MG PO TABS: 2.5000 mg | ORAL_TABLET | Freq: Every day | ORAL | Status: DC

## 2022-08-17 MED ORDER — ALUM & MAG HYDROXIDE-SIMETH 200-200-20 MG/5ML PO SUSP: 30.0000 mL | Freq: Four times a day (QID) | ORAL | Status: DC | PRN

## 2022-08-17 NOTE — ED Notes (Signed)
As this rn was making rounds, pt was seen alert, talking and laughing and pointing in the air to no one there in the room.

## 2022-08-17 NOTE — BH Assessment (Signed)
Patient has been accepted to [facility].  Patient assigned to room 501 bed 1 Accepting physician is Dr. Caswell Corwin .  Call report to [phone].  Representative was [facility].   ER Staff is aware of it:  [facility] ER Secretary  Dr. Ellender Hose, ER MD  Joelene Millin Patient's Nurse

## 2022-08-17 NOTE — ED Notes (Signed)
Patient signed paper copy of consent for transfer.

## 2022-08-17 NOTE — ED Notes (Signed)
22 when dressed out

## 2022-08-17 NOTE — ED Notes (Signed)
Pt brought in by family .  Pt reports hearing voices.  Pt denies SI or HI.  Pt denies drugs or etoh use.  Pt calm and cooperative at this time.
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
## 2022-08-17 NOTE — Telephone Encounter (Signed)
Medication management - Assisted patient with his parents to get to the ED from [place] office after evaluation by Dr. Shea Evans. Pt checked in and waiting with parents for futher evaluation. Dr. Shea Evans provided the phone number for the ED nursing station that will have patient [phone] to call with report and to also put in [facility] his note from today.  Parents agreed to call if any issues with getting patient further evaluated today and patient waiting and in agreement to be seen.

## 2022-08-17 NOTE — ED Notes (Signed)
moved to bhu 4.Marland Kitchen

## 2022-08-17 NOTE — BH Assessment (Signed)
Comprehensive Clinical Assessment (CCA) Note  08/17/2022 Carl [name]. [id number]  Carl [name]. 52 year old male who presents to [facility] ED voluntarily for treatment. Per triage note, Pt brought in by family . Pt reports hearing voices.  Pt denies SI or HI.  Pt denies drugs or etoh use.  Pt calm and cooperative at this time.      During TTS assessment pt presents alert and oriented x 4, restless but cooperative, and mood-congruent with affect. The pt does not appear to be responding to internal or external stimuli. Neither is the pt presenting with any delusional thinking. Pt verified the information provided to triage RN.   Pt identifies his main complaint to be that he has been agitated but is not sure why. Patient states he has been having auditory hallucinations, yet the voices are not "harming". Patient reports he believes there is an issue with his medications. "My mom says I need to get my meds straightened out." Patient reports he lives with his parents. Patient denies using any illicit substances and alcohol. Patient reports his appetite and sleep habits are good. Pt denies current SI/HI/AH/VH.    Per Christal, NP, pt is recommended for inpatient psychiatric admission.    Chief Complaint:  Chief Complaint  Patient presents with   Psychiatric Evaluation   Visit Diagnosis: Agitation    CCA Screening, Triage and Referral (STR)  Patient Reported Information How did you hear about [place]? Family/Friend  Referral name: No data recorded Referral phone number: No data recorded  Whom do you see for routine medical problems? No data recorded Practice/Facility Name: No data recorded Practice/Facility Phone Number: No data recorded Name of Contact: No data recorded Contact Number: No data recorded Contact Fax Number: No data recorded Prescriber Name: No data recorded Prescriber Address (if known): No data recorded  What Is the Reason for Your Visit/Call Today? Patient was brought  to the ED for agitation and med adjustment.  How Long Has This Been Causing You Problems? <Week  What Do You Feel Would Help You the Most Today? Medication(s)   Have You Recently Been in Any Inpatient Treatment (Hospital/Detox/Crisis Center/28-Day Program)? No data recorded Name/Location of Program/Hospital:No data recorded How Long Were You There? No data recorded When Were You Discharged? No data recorded  Have You Ever Received Services From [facility] Before? No data recorded Who Do You See at [facility] No data recorded  Have You Recently Had Any Thoughts About Hurting Yourself? No  Are You Planning to Commit Suicide/Harm Yourself At This time? No   Have you Recently Had Thoughts About [place]? No  Explanation: No data recorded  Have You Used Any Alcohol or Drugs in the Past 24 Hours? No  How Long Ago Did You Use Drugs or Alcohol? No data recorded What Did You Use and How Much? No data recorded  Do You Currently Have a Therapist/Psychiatrist? Yes  Name of Therapist/Psychiatrist: Dr. Shea Evans   Have You Been Recently Discharged From Any Office Practice or Programs? No  Explanation of Discharge From Practice/Program: No data recorded    CCA Screening Triage Referral Assessment Type of Contact: Face-to-Face  Is this Initial or Reassessment? No data recorded Date Telepsych consult ordered in [facility]:  No data recorded Time Telepsych consult ordered in [facility]:  No data recorded  Patient Reported Information Reviewed? No data recorded Patient Left Without Being Seen? No data recorded Reason for Not Completing Assessment: No data recorded  Collateral Involvement: None provided  Does Patient Have a Stage manager Guardian? No data recorded Name and Contact of Legal Guardian: No data recorded If Minor and Not Living with Parent(s), Who has Custody? No data recorded Is CPS involved or ever been involved? No data recorded Is APS involved or ever been  involved? No data recorded  Patient Determined To Be At Risk for Harm To Self or Others Based on Review of Patient Reported Information or Presenting Complaint? No data recorded Method: No data recorded Availability of Means: No data recorded Intent: No data recorded Notification Required: No data recorded Additional Information for Danger to Others Potential: No data recorded Additional Comments for Danger to Others Potential: No data recorded Are There Guns or Other Weapons in Your Home? No data recorded Types of Guns/Weapons: No data recorded Are These Weapons Safely Secured?                            No data recorded Who Could Verify You Are Able To Have These Secured: No data recorded Do You Have any Outstanding Charges, Pending Court Dates, Parole/Probation? No data recorded Contacted To Inform of Risk of Harm To Self or Others: No data recorded  Location of Assessment: [facility]   Does Patient Present under Involuntary Commitment? No  IVC Papers Initial File Date: No data recorded  [place] of Residence:    Patient Currently Receiving the Following Services: Medication Management; IOP (Intensive Outpatient Program)   Determination of Need: Emergent (2 hours)   Options For Referral: ED Visit; Inpatient Hospitalization; Outpatient Therapy; Medication Management     CCA Biopsychosocial Intake/Chief Complaint:  No data recorded Current Symptoms/Problems: No data recorded  Patient Reported Schizophrenia/Schizoaffective Diagnosis in Past: Yes   Strengths: Patient able to communicate and verbalize needs.  Preferences: No data recorded Abilities: No data recorded  Type of Services Patient Feels are Needed: No data recorded  Initial Clinical Notes/Concerns: No data recorded  Mental Health Symptoms Depression:   Change in energy/activity   Duration of Depressive symptoms:  Less than two weeks   Mania:   N/A   Anxiety:    Irritability   Psychosis:    None   Duration of Psychotic symptoms: No data recorded  Trauma:   N/A   Obsessions:   N/A   Compulsions:   N/A   Inattention:   N/A   Hyperactivity/Impulsivity:   N/A   Oppositional/Defiant Behaviors:   N/A   Emotional Irregularity:   N/A   Other Mood/Personality Symptoms:  No data recorded   Mental Status Exam Appearance and self-care  Stature:   Average   Weight:   Average weight   Clothing:   Casual   Grooming:   Normal   Cosmetic use:   None   Posture/gait:   Normal   Motor activity:   Not Remarkable   Sensorium  Attention:   Normal   Concentration:   Normal   Orientation:   X5   Recall/memory:   Normal   Affect and Mood  Affect:   Anxious   Mood:   Anxious   Relating  Eye contact:   Normal   Facial expression:   Anxious   Attitude toward examiner:   Cooperative   Thought and Language  Speech flow:  Garbled   Thought content:   Appropriate to Mood and Circumstances   Preoccupation:   None   Hallucinations:   Auditory   Organization:  No data recorded  Transport planner of Knowledge:   Average   Intelligence:   Average   Abstraction:   Functional   Judgement:   Fair   Art therapist:   Adequate   Insight:   Fair   Decision Making:  No data recorded  Social Functioning  Social Maturity:   Irresponsible   Social Judgement:  No data recorded  Stress  Stressors:   Illness   Coping Ability:   Exhausted   Skill Deficits:   Decision making; Intellect/education   Supports:   Family     Religion:    Leisure/Recreation:    Exercise/Diet: Exercise/Diet Have You Gained or Lost A Significant Amount of Weight in the Past Six Months?: No Do You Follow a Special Diet?: No Do You Have Any Trouble Sleeping?: No   CCA Employment/Education Employment/Work Situation: Employment / Work Technical sales engineer: On disability How Long has Patient Been on Disability:  Unable to recall Patient's Job has Been Impacted by Current Illness: Yes Describe how Patient's Job has Been Impacted: on disability unable to work Has Patient ever Been in Passenger transport manager?: No  Education:     CCA Family/Childhood History Family and Relationship History: Family history Marital status: Single Does patient have children?: No  Childhood History:  Childhood History By whom was/is the patient raised?: Both parents  Child/Adolescent Assessment:     CCA Substance Use Alcohol/Drug Use: Alcohol / Drug Use Pain Medications: See PTA Prescriptions: See PTA Over the Counter: See PTA History of alcohol / drug use?: No history of alcohol / drug abuse Longest period of sobriety (when/how long): n/a                         ASAM's:  Six Dimensions of Multidimensional Assessment  Dimension 1:  Acute Intoxication and/or Withdrawal Potential:      Dimension 2:  Biomedical Conditions and Complications:      Dimension 3:  Emotional, Behavioral, or Cognitive Conditions and Complications:     Dimension 4:  Readiness to Change:     Dimension 5:  Relapse, Continued use, or Continued Problem Potential:     Dimension 6:  Recovery/Living Environment:     ASAM Severity Score:    ASAM Recommended Level of Treatment:     Substance use Disorder (SUD)    Recommendations for Services/Supports/Treatments:    DSM5 Diagnoses: Patient Active Problem List   Diagnosis Date Noted   Agitation 08/17/2022   Family history of cancer 04/13/2022   Microcytosis 04/13/2022   Sleep disorder 03/02/2022   Schizophrenia ([place]) 08/21/2021   High risk medication use 08/21/2021   At risk for prolonged QT interval syndrome 08/21/2021   History of colon cancer    Goals of care, counseling/discussion 04/08/2020   Malignant neoplasm of colon ([place]) 03/26/2020   Intussusception of colon ([place]) [id number]   Umbilical hernia without obstruction and without gangrene    Colonic mass     Abnormal CT scan, colon    Polyp of colon    Schizoaffective disorder, bipolar type without good prognostic features ([place]) 09/05/2017   Seizures ([place]) 09/05/2017   Infected postoperative seroma 02/27/2015    Patient Centered Plan: Patient is on the following Treatment Plan(s):     Referrals to Alternative Service(s): Referred to Alternative Service(s):   Place:   Date:   Time:    Referred to Alternative Service(s):   Place:   Date:   Time:    Referred to Alternative Service(s):  Place:   Date:   Time:    Referred to Alternative Service(s):   Place:   Date:   Time:      @BHCOLLABOFCARE$ @  [place], Counselor, LCAS-A

## 2022-08-17 NOTE — ED Notes (Signed)
Snack: a cup of vanilla icecream and graham crackers.

## 2022-08-17 NOTE — ED Provider Notes (Signed)
[facility] Provider Note    Event Date/Time   First MD Initiated Contact with Patient 08/17/22 1510     (approximate)   History   Psychiatric Evaluation   HPI  Carl [name]. is a 52 y.o. male  here with auditory hallucinations. Pt reports he has been hearing voices since November when his meds were changed. He is not sure why his meds were changed. Voices are multiple. Denies thoughts of self harm or violence promoted by voices but they have been increasingly severe and interfering with his day to day activities. Was asked to come here by his mother who he lives with. No other complaints.       Physical Exam   Triage Vital Signs: ED Triage Vitals  Enc Vitals Group     BP 08/17/22 1448 (!) 126/96     Pulse Rate 08/17/22 1448 84     Resp 08/17/22 1448 17     Temp 08/17/22 1448 98.1 F (36.7 C)     Temp Source 08/17/22 1448 Oral     SpO2 08/17/22 1448 99 %     Weight 08/17/22 1449 185 lb (83.9 kg)     Height --      Head Circumference --      Peak Flow --      Pain Score 08/17/22 1448 0     Pain Loc --      Pain Edu? --      Excl. in [place]? --     Most recent vital signs: Vitals:   08/17/22 1448  BP: (!) 126/96  Pulse: 84  Resp: 17  Temp: 98.1 F (36.7 C)  SpO2: 99%     General: Awake, no distress.  CV:  Good peripheral perfusion. RRR. No murmurs. Resp:  Normal effort. Lungs clear. Abd:  No distention. No tenderness. Other:  Alert, oriented, calm. +AH and occasionally seems to be responding to internal stimuli.   ED Results / Procedures / Treatments   Labs (all labs ordered are listed, but only abnormal results are displayed) Labs Reviewed  COMPREHENSIVE METABOLIC PANEL - Abnormal; Notable for the following components:      Result Value   Glucose, Bld 101 (*)    Creatinine, Ser 1.25 (*)    AST 13 (*)    All other components within normal limits  SALICYLATE LEVEL - Abnormal; Notable for the following components:    Salicylate Lvl [id number] (*)    All other components within normal limits  ACETAMINOPHEN LEVEL - Abnormal; Notable for the following components:   Acetaminophen (Tylenol), Serum <10 (*)    All other components within normal limits  ETHANOL  CBC  URINE DRUG SCREEN, QUALITATIVE ([facility] ONLY)     EKG    RADIOLOGY    I also independently reviewed and agree with radiologist interpretations.   PROCEDURES:  Critical Care performed: No   MEDICATIONS ORDERED IN ED: Medications  acetaminophen (TYLENOL) tablet 650 mg (has no administration in time range)  alum & mag hydroxide-simeth (MAALOX/MYLANTA) 200-200-20 MG/5ML suspension 30 mL (has no administration in time range)  traZODone (DESYREL) tablet 100 mg (has no administration in time range)  QUEtiapine (SEROQUEL XR) 24 hr tablet 300 mg (has no administration in time range)  fluPHENAZine (PROLIXIN) tablet 2.5 mg (has no administration in time range)  famotidine (PEPCID) tablet 20 mg (20 mg Oral Given 08/17/22 1532)  loratadine (CLARITIN) tablet 10 mg (10 mg Oral Given 08/17/22 1532)  benztropine (COGENTIN) tablet 1  mg (1 mg Oral Given 08/17/22 1535)     IMPRESSION / MDM / ASSESSMENT AND PLAN / ED COURSE  I reviewed the triage vital signs and the nursing notes.                              Differential diagnosis includes, but is not limited to, schizophrenia, schizoaffective d/o, substance use, medication effect.  Patient's presentation is most consistent with acute presentation with potential threat to life or bodily function.  52 yo M with h/o schizophrenia here with increasing auditory hallucinations. No SI, HI. He does appear to be responding to internal stimuli with ongoing hallucinations. TTS/Psych consulted. Here voluntarily at this time. Home meds ordered.   Labs reassuring. No leukocytosis. Tox labs negative. Stable for psych dispo.  FINAL CLINICAL IMPRESSION(S) / ED DIAGNOSES   Final diagnoses:  Paranoid schizophrenia  ([place])     Rx / DC Orders   ED Discharge Orders     None        Note:  This document was prepared using Dragon voice recognition software and may include unintentional dictation errors.   Duffy Bruce, MD 08/17/22 [phone]

## 2022-08-17 NOTE — ED Notes (Signed)
Patient signed waiver for safe transport.

## 2022-08-17 NOTE — ED Notes (Signed)
EMTALA reviewed by this RN.  Transfer consent signed hard copy.

## 2022-08-17 NOTE — ED Triage Notes (Signed)
Pt mother is with pt and sts that pt has been hearing things since November of 2023.

## 2022-08-17 NOTE — ED Notes (Signed)
Pt. To BHU from ED ambulatory without difficulty, to room  BHU 4. Report from Amy RN. Pt. Is alert and oriented, warm and dry in no distress. Pt. Denies SI, HI, and AVH. Pt. Calm and cooperative. Pt. Made aware of security cameras and Q15 minute rounds. Pt. Encouraged to let Nursing staff know of any concerns or needs.   ENVIRONMENTAL ASSESSMENT Potentially harmful objects out of patient reach: Yes.   Personal belongings secured: Yes.   Patient dressed in hospital provided attire only: Yes.   Plastic bags out of patient reach: Yes.   Patient care equipment (cords, cables, call bells, lines, and drains) shortened, removed, or accounted for: Yes.   Equipment and supplies removed from bottom of stretcher: Yes.   Potentially toxic materials out of patient reach: Yes.   Sharps container removed or out of patient reach: Yes.

## 2022-08-17 NOTE — ED Notes (Signed)
Called [facility] for transport to [facility]. They will be arriving in 30 mins.

## 2022-08-17 NOTE — ED Notes (Signed)
Report off to [place] rn bhu nurse

## 2022-08-17 NOTE — ED Notes (Signed)
Hospital meal provided, pt tolerated w/o complaints.  Waste discarded appropriately.  

## 2022-08-17 NOTE — Consult Note (Signed)
[facility] Face-to-Face Psychiatry Consult   Reason for Consult: Psychiatric Evaluation  Referring Physician:  Dr. Myrene Buddy  Patient Identification: Carl [name]. MRN:  [id number] Principal Diagnosis: Schizophrenia ([place]) Diagnosis:  Principal Problem:   Schizophrenia ([place]) Active Problems:   Agitation   Total Time spent with patient: 1 hour  Subjective: " I got agitated and hearing voices". Acencion Toni. is a 52 y.o. male patient admitted with auditory hallucinations and agitation.  Upon evaluation, the patient was observed sitting up on side of the bed.  He reports agitation and auditory hallucinations, not remembering the events that led up to the agitation. The patient is unable to discern the content of the voices but denies the voices are mean or negative. The frequency of the auditory hallucinations is described as "not too very often" and "sometimes". The patient denies suicidal or homicidal ideations and reports no visual hallucinations. Patient reports having a good sleep pattern, and a satisfactory appetite. The patient denies any alcohol or drug use and has no history of self-harm attempts.  The patient admits that the voices sometimes cause agitation.  Collateral: RAMSES LENGLE (mother) reports, the patient has been experiencing auditory hallucinations, specifically hearing voices.  She reports that he has been taking fluphenazine 10 mg, but a new doctor started weaning him off the medication. Ms. Tora Perches reports the patient has a history of taking clozapine for 30 years, but it was discontinued due to blood work concerns. Ms. Weisman reports since the decrease in fluphenazine, patient has been experiencing increased agitation, resulting in kicking and punching walls inside the home. She reports that the patient has had a past hospital stay in a psychiatric unit at [facility].   Plan: The patient meets criteria for inpatient psychiatric admission.  Reviewed with Dr. Ellender Hose,  EDP.   HPI:  Per Dr. Myrene Buddy, Carl [name]. is a 52 y.o. male  here with auditory hallucinations. Pt reports he has been hearing voices since November when his meds were changed. He is not sure why his meds were changed. Voices are multiple. Denies thoughts of self harm or violence promoted by voices but they have been increasingly severe and interfering with his day to day activities. Was asked to come here by his mother who he lives with. No other complaints.    Past Psychiatric History: Schizophrenia  Risk to Self:  No Risk to Others:  No  Prior Inpatient Therapy:  Yes  Prior Outpatient Therapy: Yes    Past Medical History:  Past Medical History:  Diagnosis Date   Abdominal pain    Anxiety    Constipation    Decreased visual acuity    Nevus    Polyuria    Recurrent boils    scalp behind ear   Schizophrenia (HCC)    Seizures ([place]) 08/15/1990   due to haldol   Soft tissue mass    left scalp posterior    Past Surgical History:  Procedure Laterality Date   COLONOSCOPY WITH PROPOFOL N/A 02/14/2020   Procedure: COLONOSCOPY WITH PROPOFOL;  Surgeon: Virgel Manifold, MD;  Location: [facility] ENDOSCOPY;  Service: Endoscopy;  Laterality: N/A;   COLONOSCOPY WITH PROPOFOL N/A 02/28/2020   Procedure: COLONOSCOPY WITH PROPOFOL;  Surgeon: Virgel Manifold, MD;  Location: [facility] ENDOSCOPY;  Service: Endoscopy;  Laterality: N/A;   COLONOSCOPY WITH PROPOFOL N/A 03/18/2021   Procedure: COLONOSCOPY WITH PROPOFOL;  Surgeon: Virgel Manifold, MD;  Location: [facility] ENDOSCOPY;  Service: Endoscopy;  Laterality: N/A;   LAPAROSCOPIC RIGHT  COLECTOMY Right 03/11/2020   Procedure: LAPAROSCOPIC RIGHT COLECTOMY, possible open;  Surgeon: Olean Ree, MD;  Location: [facility] ORS;  Service: General;  Laterality: Right;   MASS EXCISION Left 02/11/2015   Procedure: EXCISION MASS, left post neck;  Surgeon: Florene Glen, MD;  Location: [facility];  Service: General;  Laterality: Left;   Family  History:  Family History  Problem Relation Age of Onset   Hyperlipidemia Mother    Hypertension Mother    Fibromyalgia Mother    Hypertension Father    Diabetes Father    Hyperlipidemia Father    Mental illness Neg Hx    Family Psychiatric  History: None reported  Social History:  Social History   Substance and Sexual Activity  Alcohol Use No   Alcohol/week: 0.0 standard drinks of alcohol     Social History   Substance and Sexual Activity  Drug Use No    Social History   Socioeconomic History   Marital status: Single    Spouse name: Not on file   Number of children: Not on file   Years of education: Not on file   Highest education level: 12th grade  Occupational History   Occupation: on disability  Tobacco Use   Smoking status: Never   Smokeless tobacco: Never  Vaping Use   Vaping Use: Never used  Substance and Sexual Activity   Alcohol use: No    Alcohol/week: 0.0 standard drinks of alcohol   Drug use: No   Sexual activity: Not Currently  Other Topics Concern   Not on file  Social History Narrative   Not on file   Social Determinants of Health   Financial Resource Strain: Not on file  Food Insecurity: Not on file  Transportation Needs: Not on file  Physical Activity: Not on file  Stress: Not on file  Social Connections: Not on file   Additional Social History:    Allergies:   Allergies  Allergen Reactions   Haldol [Haloperidol] Other (See Comments)    Causes seizures    Labs:  Results for orders placed or performed during the hospital encounter of 08/17/22 (from the past 48 hour(s))  Comprehensive metabolic panel     Status: Abnormal   Collection Time: 08/17/22  2:54 PM  Result Value Ref Range   Sodium 136 135 - 145 mmol/L   Potassium 3.7 3.5 - 5.1 mmol/L   Chloride 102 98 - 111 mmol/L   CO2 24 22 - 32 mmol/L   Glucose, Bld 101 (H) 70 - 99 mg/dL    Comment: Glucose reference range applies only to samples taken after fasting for at least 8  hours.   BUN 12 6 - 20 mg/dL   Creatinine, Ser 1.25 (H) 0.61 - 1.24 mg/dL   Calcium 9.3 8.9 - 10.3 mg/dL   Total Protein 7.0 6.5 - 8.1 g/dL   Albumin 4.0 3.5 - 5.0 g/dL   AST 13 (L) 15 - 41 U/L   ALT 10 0 - 44 U/L   Alkaline Phosphatase 49 38 - 126 U/L   Total Bilirubin 0.6 0.3 - 1.2 mg/dL   GFR, Estimated >60 >60 mL/min    Comment: (NOTE) Calculated using the CKD-EPI Creatinine Equation (2021)    Anion gap 10 5 - 15    Comment: Performed at [facility], [address] [place], [place] [address]  Ethanol     Status: None   Collection Time: 08/17/22  2:54 PM  Result Value Ref Range  Alcohol, Ethyl (B) <10 <10 mg/dL    Comment: (NOTE) Lowest detectable limit for serum alcohol is 10 mg/dL.  For medical purposes only. Performed at [facility], [address] [place], [place] [id number]   Salicylate level     Status: Abnormal   Collection Time: 08/17/22  2:54 PM  Result Value Ref Range   Salicylate Lvl Q000111Q (L) 7.0 - 30.0 mg/dL    Comment: Performed at [facility], [place], [address] [place], [place] [address]  Acetaminophen level     Status: Abnormal   Collection Time: 08/17/22  2:54 PM  Result Value Ref Range   Acetaminophen (Tylenol), Serum <10 (L) 10 - 30 ug/mL    Comment: (NOTE) Therapeutic concentrations vary significantly. A range of 10-30 ug/mL  may be an effective concentration for many patients. However, some  are best treated at concentrations outside of this range. Acetaminophen concentrations >150 ug/mL at 4 hours after ingestion  and >50 ug/mL at 12 hours after ingestion are often associated with  toxic reactions.  Performed at [facility], [address] [place], [place] [address]   cbc     Status: None   Collection Time: 08/17/22  2:54 PM  Result Value Ref Range   WBC 4.1 4.0 - 10.5 K/uL   RBC 5.32 4.22 - 5.81 MIL/uL   Hemoglobin 14.3 13.0 - 17.0 g/dL   HCT 42.8 39.0 - 52.0 %   MCV 80.5 80.0 - 100.0 fL   MCH  26.9 26.0 - 34.0 pg   MCHC 33.4 30.0 - 36.0 g/dL   RDW 12.9 11.5 - 15.5 %   Platelets 264 150 - 400 K/uL   nRBC 0.0 0.0 - 0.2 %    Comment: Performed at [facility], [address], , [place] [address]  Urine Drug Screen, Qualitative     Status: Abnormal   Collection Time: 08/17/22  2:54 PM  Result Value Ref Range   Tricyclic, Ur Screen POSITIVE (A) NONE DETECTED   Amphetamines, Ur Screen NONE DETECTED NONE DETECTED   MDMA (Ecstasy)Ur Screen NONE DETECTED NONE DETECTED   Cocaine Metabolite,Ur [place] NONE DETECTED NONE DETECTED   Opiate, Ur Screen NONE DETECTED NONE DETECTED   Phencyclidine (PCP) Ur S NONE DETECTED NONE DETECTED   Cannabinoid 50 Ng, Ur [place] NONE DETECTED NONE DETECTED   Barbiturates, Ur Screen NONE DETECTED NONE DETECTED   Benzodiazepine, Ur Scrn NONE DETECTED NONE DETECTED   Methadone Scn, Ur NONE DETECTED NONE DETECTED    Comment: (NOTE) Tricyclics + metabolites, urine    Cutoff 1000 ng/mL Amphetamines + metabolites, urine  Cutoff 1000 ng/mL MDMA (Ecstasy), urine              Cutoff 500 ng/mL Cocaine Metabolite, urine          Cutoff 300 ng/mL Opiate + metabolites, urine        Cutoff 300 ng/mL Phencyclidine (PCP), urine         Cutoff 25 ng/mL Cannabinoid, urine                 Cutoff 50 ng/mL Barbiturates + metabolites, urine  Cutoff 200 ng/mL Benzodiazepine, urine              Cutoff 200 ng/mL Methadone, urine                   Cutoff 300 ng/mL  The urine drug screen provides only a preliminary, unconfirmed analytical test result and should not be  used for non-medical purposes. Clinical consideration and professional judgment should be applied to any positive drug screen result due to possible interfering substances. A more specific alternate chemical method must be used in order to obtain a confirmed analytical result. Gas chromatography / mass spectrometry (GC/MS) is the preferred confirm atory method. Performed at [facility], [address] [place], [place] [address]   Resp panel by RT-PCR (RSV, Flu A&B, Covid) Anterior Nasal Swab     Status: None   Collection Time: 08/17/22  7:27 PM   Specimen: Anterior Nasal Swab  Result Value Ref Range   SARS Coronavirus 2 by RT PCR NEGATIVE NEGATIVE    Comment: (NOTE) SARS-CoV-2 target nucleic acids are NOT DETECTED.  The SARS-CoV-2 RNA is generally detectable in upper respiratory specimens during the acute phase of infection. The lowest concentration of SARS-CoV-2 viral copies this assay can detect is 138 copies/mL. A negative result does not preclude SARS-Cov-2 infection and should not be used as the sole basis for treatment or other patient management decisions. A negative result may occur with  improper specimen collection/handling, submission of specimen other than nasopharyngeal swab, presence of viral mutation(s) within the areas targeted by this assay, and inadequate number of viral copies(<138 copies/mL). A negative result must be combined with clinical observations, patient history, and epidemiological information. The expected result is Negative.  Fact Sheet for Patients:  EntrepreneurPulse.com.au  Fact Sheet for Healthcare Providers:  IncredibleEmployment.be  This test is no t yet approved or cleared by the [place] FDA and  has been authorized for detection and/or diagnosis of SARS-CoV-2 by FDA under an Emergency Use Authorization (EUA). This EUA will remain  in effect (meaning this test can be used) for the duration of the COVID-19 declaration under Section 564(b)(1) of the Act, 21 U.S.C.section 360bbb-3(b)(1), unless the authorization is terminated  or revoked sooner.       Influenza A by PCR NEGATIVE NEGATIVE   Influenza B by PCR NEGATIVE NEGATIVE    Comment: (NOTE) The Xpert Xpress SARS-CoV-2/FLU/RSV plus assay is intended as an aid in the diagnosis of influenza from Nasopharyngeal swab specimens  and should not be used as a sole basis for treatment. Nasal washings and aspirates are unacceptable for Xpert Xpress SARS-CoV-2/FLU/RSV testing.  Fact Sheet for Patients: EntrepreneurPulse.com.au  Fact Sheet for Healthcare Providers: IncredibleEmployment.be  This test is not yet approved or cleared by the [place] FDA and has been authorized for detection and/or diagnosis of SARS-CoV-2 by FDA under an Emergency Use Authorization (EUA). This EUA will remain in effect (meaning this test can be used) for the duration of the COVID-19 declaration under Section 564(b)(1) of the Act, 21 U.S.C. section 360bbb-3(b)(1), unless the authorization is terminated or revoked.     Resp Syncytial Virus by PCR NEGATIVE NEGATIVE    Comment: (NOTE) Fact Sheet for Patients: EntrepreneurPulse.com.au  Fact Sheet for Healthcare Providers: IncredibleEmployment.be  This test is not yet approved or cleared by the [place] FDA and has been authorized for detection and/or diagnosis of SARS-CoV-2 by FDA under an Emergency Use Authorization (EUA). This EUA will remain in effect (meaning this test can be used) for the duration of the COVID-19 declaration under Section 564(b)(1) of the Act, 21 U.S.C. section 360bbb-3(b)(1), unless the authorization is terminated or revoked.  Performed at [facility], [address] [place], [place] [address]     Current Facility-Administered Medications  Medication Dose Route Frequency Provider Last Rate Last Admin   acetaminophen (TYLENOL)  tablet 650 mg  650 mg Oral Q4H PRN Duffy Bruce, MD       alum & mag hydroxide-simeth (MAALOX/MYLANTA) 200-200-20 MG/5ML suspension 30 mL  30 mL Oral Q6H PRN Duffy Bruce, MD       benztropine (COGENTIN) tablet 1 mg  1 mg Oral Daily Duffy Bruce, MD   1 mg at 08/17/22 1535   famotidine (PEPCID) tablet 20 mg  20 mg Oral BID Duffy Bruce, MD   20 mg at 08/17/22 2308   [START ON 08/18/2022] fluPHENAZine (PROLIXIN) tablet 2.5 mg  2.5 mg Oral QPC lunch Duffy Bruce, MD       loratadine (CLARITIN) tablet 10 mg  10 mg Oral Daily Duffy Bruce, MD   10 mg at 08/17/22 1532   risperiDONE (RISPERDAL M-TABS) disintegrating tablet 2 mg  2 mg Oral Q8H PRN Caroline Sauger, NP       And   LORazepam (ATIVAN) tablet 1 mg  1 mg Oral PRN Caroline Sauger, NP       And   ziprasidone (GEODON) injection 20 mg  20 mg Intramuscular PRN Caroline Sauger, NP       QUEtiapine (SEROQUEL XR) 24 hr tablet 300 mg  300 mg Oral QHS Duffy Bruce, MD   300 mg at 08/17/22 2307   traZODone (DESYREL) tablet 100 mg  100 mg Oral QHS PRN Duffy Bruce, MD       Current Outpatient Medications  Medication Sig Dispense Refill   benztropine (COGENTIN) 1 MG tablet Take 1 tablet (1 mg total) by mouth daily. 30 tablet 3   cetirizine (ZYRTEC) 10 MG tablet Take 10 mg by mouth daily as needed for allergies.     docusate sodium (COLACE) 100 MG capsule Take 1 capsule (100 mg total) by mouth daily. (Patient taking differently: Take 100 mg by mouth 2 (two) times daily.) 30 capsule 1   famotidine (PEPCID) 20 MG tablet Take 20 mg by mouth 2 (two) times daily.     fluPHENAZine (PROLIXIN) 2.5 MG tablet Take 1 tablet (2.5 mg total) by mouth daily after lunch. DOSE CHANGE , PLEASE STOP FLUPHENAZINE 5 MG 30 tablet 2   fluticasone (FLONASE) 50 MCG/ACT nasal spray Place 2 sprays into the nose daily as needed for allergies.      QUEtiapine (SEROQUEL XR) 300 MG 24 hr tablet Take 1 tablet (300 mg total) by mouth at bedtime. 30 tablet 3   QUEtiapine (SEROQUEL XR) 50 MG TB24 24 hr tablet Take 2 tablets (100 mg total) by mouth at bedtime. Take along with 300 mg at bedtime, total of 400 mg . 60 tablet 2   traZODone (DESYREL) 100 MG tablet Take 1 tablet (100 mg total) by mouth at bedtime as needed for sleep. Use only as needed for sleep 30 tablet 3     Musculoskeletal: Strength & Muscle Tone: within normal limits Gait & Station: normal Patient leans: N/A            Psychiatric Specialty Exam:  Presentation  General Appearance: Casual  Eye Contact:Good  Speech:Slurred  Speech Volume:Decreased  Handedness:Right   Mood and Affect  Mood:Euthymic  Affect:Blunt   Thought Process  Thought Processes:Goal Directed  Descriptions of Associations:Intact  Orientation:Full (Time, Place and Person)  Thought Content:No data recorded History of Schizophrenia/Schizoaffective disorder:Yes  Duration of Psychotic Symptoms:No data recorded Hallucinations:Hallucinations: Auditory  Ideas of Reference:None  Suicidal Thoughts:Suicidal Thoughts: No  Homicidal Thoughts:Homicidal Thoughts: No   Sensorium  Memory:Immediate Poor  Judgment:Poor  Insight:Lacking  Executive Functions  Concentration:Poor  Attention Span:Poor  [place]  Language:Poor   Psychomotor Activity  Psychomotor Activity:Psychomotor Activity: Clinical biochemist; Desire for Improvement; Financial Resources/Insurance; Housing; Physical Health; Social Support   Sleep  Sleep:Sleep: Good   Physical Exam: Physical Exam Constitutional:      Appearance: Normal appearance. He is normal weight.  HENT:     Head: Normocephalic and atraumatic.     Nose: Nose normal.  Pulmonary:     Effort: Pulmonary effort is normal.  Musculoskeletal:        General: Normal range of motion.     Cervical back: Normal range of motion.  Neurological:     Mental Status: He is alert.  Psychiatric:        Mood and Affect: Mood normal.    Review of Systems  Constitutional: Negative.   HENT: Negative.    Respiratory: Negative.    Musculoskeletal: Negative.   Neurological: Negative.   Psychiatric/Behavioral:  Positive for hallucinations.    Blood pressure (!) 115/91, pulse 83,  temperature 97.6 F (36.4 C), temperature source Oral, resp. rate 17, weight 83.9 kg, SpO2 97 %. Body mass index is 27.32 kg/m.  Treatment Plan Summary: Plan The patient meets criteria for inpatient psychiatric admission.  Reviewed with Dr. Ellender Hose, [place].  Disposition:  Recommend psychiatric Inpatient admission when medically cleared. Supportive therapy provided about ongoing stressors.  Ronny Flurry, NP 08/17/2022 11:26 PM

## 2022-08-18 ENCOUNTER — Encounter (HOSPITAL_COMMUNITY): Payer: Self-pay

## 2022-08-18 ENCOUNTER — Inpatient Hospital Stay (HOSPITAL_COMMUNITY)
Admission: AD | Admit: 2022-08-18 | Discharge: 2022-08-24 | DRG: 885 | Disposition: A | Discharge status: Home / Self Care | Payer: 59 | Attending: Psychiatry | Admitting: Psychiatry

## 2022-08-18 ENCOUNTER — Other Ambulatory Visit: Payer: Self-pay

## 2022-08-18 DIAGNOSIS — Z79899 Other long term (current) drug therapy: Secondary | ICD-10-CM | POA: Diagnosis not present

## 2022-08-18 DIAGNOSIS — F203 Undifferentiated schizophrenia: Secondary | ICD-10-CM | POA: Diagnosis not present

## 2022-08-18 DIAGNOSIS — G4700 Insomnia, unspecified: Secondary | ICD-10-CM | POA: Diagnosis present

## 2022-08-18 DIAGNOSIS — Z560 Unemployment, unspecified: Secondary | ICD-10-CM | POA: Diagnosis not present

## 2022-08-18 DIAGNOSIS — F209 Schizophrenia, unspecified: Principal | ICD-10-CM | POA: Diagnosis present

## 2022-08-18 DIAGNOSIS — I10 Essential (primary) hypertension: Secondary | ICD-10-CM | POA: Diagnosis present

## 2022-08-18 DIAGNOSIS — Z85038 Personal history of other malignant neoplasm of large intestine: Secondary | ICD-10-CM

## 2022-08-18 DIAGNOSIS — J301 Allergic rhinitis due to pollen: Secondary | ICD-10-CM | POA: Diagnosis present

## 2022-08-18 DIAGNOSIS — R440 Auditory hallucinations: Secondary | ICD-10-CM | POA: Diagnosis present

## 2022-08-18 DIAGNOSIS — Z20822 Contact with and (suspected) exposure to covid-19: Secondary | ICD-10-CM | POA: Diagnosis present

## 2022-08-18 DIAGNOSIS — F200 Paranoid schizophrenia: Principal | ICD-10-CM | POA: Diagnosis present

## 2022-08-18 MED ORDER — QUETIAPINE FUMARATE ER 300 MG PO TB24
300.0000 mg | ORAL_TABLET | Freq: Every day | ORAL | Status: DC
  Filled 2022-08-18 (×2): qty 1

## 2022-08-18 MED ORDER — LORAZEPAM 1 MG PO TABS: 1.0000 mg | ORAL_TABLET | ORAL | Status: DC | PRN

## 2022-08-18 MED ORDER — MAGNESIUM HYDROXIDE 400 MG/5ML PO SUSP: 30.0000 mL | Freq: Every day | ORAL | Status: DC | PRN

## 2022-08-18 MED ORDER — FLUPHENAZINE HCL 2.5 MG PO TABS
2.5000 mg | ORAL_TABLET | Freq: Every day | ORAL | Status: DC
  Administered 2022-08-18: 2.5 mg via ORAL
  Filled 2022-08-18 (×2): qty 1

## 2022-08-18 MED ORDER — QUETIAPINE FUMARATE ER 400 MG PO TB24
400.0000 mg | ORAL_TABLET | Freq: Every day | ORAL | Status: DC
  Administered 2022-08-19 – 2022-08-23 (×5): 400 mg via ORAL
  Filled 2022-08-18 (×7): qty 1

## 2022-08-18 MED ORDER — LORATADINE 10 MG PO TABS
10.0000 mg | ORAL_TABLET | Freq: Every day | ORAL | Status: DC
  Administered 2022-08-18 – 2022-08-24 (×7): 10 mg via ORAL
  Filled 2022-08-18 (×10): qty 1

## 2022-08-18 MED ORDER — BENZTROPINE MESYLATE 1 MG PO TABS
1.0000 mg | ORAL_TABLET | Freq: Every day | ORAL | Status: DC
  Administered 2022-08-18: 1 mg via ORAL
  Filled 2022-08-18 (×4): qty 1

## 2022-08-18 MED ORDER — FLUPHENAZINE HCL 2.5 MG PO TABS
2.5000 mg | ORAL_TABLET | Freq: Once | ORAL | Status: AC
  Administered 2022-08-18: 2.5 mg via ORAL
  Filled 2022-08-18 (×2): qty 1

## 2022-08-18 MED ORDER — FLUPHENAZINE HCL 2.5 MG PO TABS
2.5000 mg | ORAL_TABLET | Freq: Three times a day (TID) | ORAL | Status: DC
  Administered 2022-08-19 (×2): 2.5 mg via ORAL
  Filled 2022-08-18 (×8): qty 1

## 2022-08-18 MED ORDER — ZIPRASIDONE MESYLATE 20 MG IM SOLR: 20.0000 mg | INTRAMUSCULAR | Status: DC | PRN

## 2022-08-18 MED ORDER — OLANZAPINE 5 MG PO TBDP: 5.0000 mg | ORAL_TABLET | Freq: Three times a day (TID) | ORAL | Status: DC | PRN

## 2022-08-18 MED ORDER — FAMOTIDINE 20 MG PO TABS
20.0000 mg | ORAL_TABLET | Freq: Two times a day (BID) | ORAL | Status: DC
  Administered 2022-08-18 – 2022-08-24 (×13): 20 mg via ORAL
  Filled 2022-08-18 (×16): qty 1

## 2022-08-18 MED ORDER — QUETIAPINE FUMARATE ER 200 MG PO TB24
400.0000 mg | ORAL_TABLET | Freq: Every day | ORAL | Status: DC
  Filled 2022-08-18: qty 2

## 2022-08-18 MED ORDER — TRAZODONE HCL 100 MG PO TABS
100.0000 mg | ORAL_TABLET | Freq: Every evening | ORAL | Status: DC | PRN
  Administered 2022-08-19 – 2022-08-22 (×2): 100 mg via ORAL
  Filled 2022-08-18 (×3): qty 1

## 2022-08-18 NOTE — Progress Notes (Signed)
Recreation Therapy Notes  INPATIENT RECREATION THERAPY ASSESSMENT  Patient Details Name: Carl [name]. MRN: [id number] DOB: 04-08-1971 Today's Date: 08/18/2022       Information Obtained From: Patient  Able to Participate in Assessment/Interview: Yes  Patient Presentation: Alert  Reason for Admission (Per Patient): Other (Comments) (Hearing voices)  Patient Stressors: Other (Comment) (None)  Coping Skills:   TV, Music, Exercise, Talk, Avoidance, Read, Hot Bath/Shower  Leisure Interests (2+):  Individual - TV, Individual - Other (Comment) ("going to different places")  Frequency of Recreation/Participation: Weekly  Awareness of Community Resources:  No  Expressed Interest in [employer] Information: No  County of Residence:  [place]  Patient Main Form of Transportation: Other (Comment) (Parents)  Patient Strengths:  Help people; Communication  Patient Identified Areas of Improvement:  None  Patient Goal for Hospitalization:  "to get better"  Current SI (including self-harm):  No  Current HI:  No  Current AVH: Yes ("hearing good voices")  Staff Intervention Plan: Group Attendance, Collaborate with Interdisciplinary Treatment Team  Consent to Intern Participation: N/A   Junia Nygren-McCall, LRT,CTRS Carl [name] 08/18/2022, 2:30 PM

## 2022-08-18 NOTE — Plan of Care (Signed)

## 2022-08-18 NOTE — [facility] Counselor (Signed)
Adult Comprehensive Assessment  Patient ID: Arjay Hibberd., male   DOB: 11-12-1970, 52 y.o.   MRN: [id number]  Information Source: Information source: Patient  Current Stressors:  Patient states their primary concerns and needs for treatment are:: "Hearing voices-been hearing them for awhile" Patient states their goals for this hospitilization and ongoing recovery are:: "Get back on the right track" Educational / Learning stressors: "No" Employment / Job issues: "No" Family Relationships: "No" Financial / Lack of resources (include bankruptcy): "No" Housing / Lack of housing: "No' Physical health (include injuries & life threatening diseases): "No" Social relationships: "No" Substance abuse: "No" Bereavement / Loss: "No"  Living/Environment/Situation:  Living Arrangements: Parent Living conditions (as described by patient or guardian): Parent has a LG Who else lives in the home?: parents and patient How long has patient lived in current situation?: " 8 years" What is atmosphere in current home: Comfortable, Supportive  Family History:  Marital status: Single Are you sexually active?: No What is your sexual orientation?: "straight" Has your sexual activity been affected by drugs, alcohol, medication, or emotional stress?: N/A Does patient have children?: No  Childhood History:  By whom was/is the patient raised?: Both parents Description of patient's relationship with caregiver when they were a child: "Good" Patient's description of current relationship with people who raised him/her: "Good" How were you disciplined when you got in trouble as a child/adolescent?: " good - switches and getting grounded" Does patient have siblings?: Yes Number of Siblings: 2 Description of patient's current relationship with siblings: 2 brothers patient reports that their relationship is good Did patient suffer any verbal/emotional/physical/sexual abuse as a child?: No Did patient suffer  from severe childhood neglect?: No Has patient ever been sexually abused/assaulted/raped as an adolescent or adult?: No Was the patient ever a victim of a crime or a disaster?: No Witnessed domestic violence?: No Has patient been affected by domestic violence as an adult?: No  Education:  Highest grade of school patient has completed: GED Currently a Ship broker?: No Learning disability?: No  Employment/Work Situation:   Employment Situation: On disability Why is Patient on Disability: Mental Health How Long has Patient Been on Disability: Patient reports about in his 20's got on disability Patient's Job has Been Impacted by Current Illness: No Describe how Patient's Job has Been Impacted: on disability unable to work What is the Longest Time Patient has Held a Job?: a couple of years Where was the Patient Employed at that Time?: [employer] Has Patient ever Been in the [employer]?: No  Financial Resources:   Financial resources: Teacher, early years/pre, Medicaid Does patient have a Programmer, applications or guardian?: Yes Name of representative payee or guardian: Patient reports that his parents are his LG  Alcohol/Substance Abuse:   What has been your use of drugs/alcohol within the last 12 months?: "No" If attempted suicide, did drugs/alcohol play a role in this?: No If yes, describe treatment: N/A Has alcohol/substance abuse ever caused legal problems?: No  Social Support System:   Pensions consultant Support System: Good Describe Community Support System: Patient reports his parents are supportive Type of faith/religion: 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, manufacturing How does patient's faith help to cope with current illness?: " I go to church sometimes"  Leisure/Recreation:   Do You Have Hobbies?: Yes Leisure and Hobbies: " watch tv, play chess, and go to the store  Strengths/Needs:   What is the patient's perception of their strengths?: " I am good a chess and playing hand held games Patient  states they can use these personal strengths during their treatment to contribute to their recovery: " getting back on track and not hearing the voices anymore" Patient states these barriers may affect/interfere with their treatment: "No" Patient states these barriers may affect their return to the community: "No" Other important information patient would like considered in planning for their treatment: N/A  Discharge Plan:   Currently receiving community mental health services: Yes (From Whom) (Patient was unable to provide the information to CSW) Patient states concerns and preferences for aftercare planning are: "No" Patient states they will know when they are safe and ready for discharge when: "No" Does patient have access to transportation?: Yes Does patient have financial barriers related to discharge medications?: No Will patient be returning to same living situation after discharge?: Yes  Summary/Recommendations:   Summary and Recommendations (to be completed by the evaluator): Beckham Dasilva is a 52 y/o male who was admitted to the hospital with reports of hearing voices that he shared with CSW as being  good voices he hears. 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 Nasario states that he does not have any current stressors at this time other than the voices that he have been having for awhile. This is not patient first hospitalization, prior hospitalization were regarding medication management and severe agitation. LG reported to staff her concerns about patient needing some adjustment to his meds. [place] is unsure why he get easily agitated but expressed that he just wants to get "back on track". Patient states that he is being followed by outside providers for therapy and medication management but was unable to provide the name of psychiatrist/therapist or practice, just location. Patient did deny using any  illicit substances and alcohol.While here, Corydon Kissner can benefit from crisis stabilization, medication management, therapeutic milieu, and referrals for services.   Sherre Lain. 08/18/2022
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
## 2022-08-18 NOTE — Group Note (Signed)
Recreation Therapy Group Note   Group Topic:Team Building  Group Date: 08/18/2022 Start Time: 1005 End Time: 1035 Facilitators: Carl [name], LRT,CTRS Location: [address] Dayroom   Goal Area(s) Addresses:  Patient will effectively work with peer towards shared goal.  Patient will identify skills used to make activity successful.  Patient will identify how skills used during activity can be applied to reach post d/c goals.   Group Description: [employer]. In teams of 5-6, patients were given 12 craft pipe cleaners. Using the materials provided, patients were instructed to compete again the opposing team(s) to build the tallest free-standing structure from floor level. The activity was timed; difficulty increased by Probation officer as Pharmacist, hospital continued.  Systematically resources were removed with additional directions for example, placing one arm behind their back, working in silence, and shape stipulations. LRT facilitated post-activity discussion reviewing team processes and necessary communication skills involved in completion. Patients were encouraged to reflect how the skills utilized, or not utilized, in this activity can be incorporated to positively impact support systems post discharge.   Affect/Mood: N/A   Participation Level: Did not attend    Clinical Observations/Individualized Feedback:     Plan: Continue to engage patient in RT group sessions 2-3x/week.   Carl [name], LRT,CTRS 08/18/2022 2:20 PM

## 2022-08-18 NOTE — Progress Notes (Signed)
   08/18/22 0928  Psych Admission Type (Psych Patients Only)  Admission Status Voluntary  Psychosocial Assessment  Patient Complaints Anxiety  Eye Contact Fair  Facial Expression Pensive  Affect Appropriate to circumstance  Speech Soft  Interaction Assertive  Motor Activity Fidgety  Appearance/Hygiene Unremarkable  Behavior Characteristics Cooperative  Mood Pleasant  Thought Process  Coherency WDL  Content WDL  Delusions WDL  Perception WDL  Hallucination Auditory  Judgment Limited  Confusion Mild  Danger to Self  Current suicidal ideation? Denies  Danger to Others  Danger to Others None reported or observed

## 2022-08-18 NOTE — Group Note (Signed)
Date:  08/18/2022 Time:  10:58 AM  Group Topic/Focus:  Goals Group:   The focus of this group is to help patients establish daily goals to achieve during treatment and discuss how the patient can incorporate goal setting into their daily lives to aide in recovery.    Participation Level:  Active  Participation Quality:  Appropriate  Affect:  Appropriate  Cognitive:  Appropriate  Insight: Limited  Engagement in Group:  Limited  Modes of Intervention:  Orientation  Additional Comments:  N/A  Cephus Slater [place] 08/18/2022, 10:58 AM

## 2022-08-18 NOTE — Progress Notes (Signed)
Adult Psychoeducational Group Note  Date:  08/18/2022 Time:  11:18 PM  Group Topic/Focus:  Wrap-Up Group:   The focus of this group is to help patients review their daily goal of treatment and discuss progress on daily workbooks.  Participation Level:  Active  Participation Quality:  Appropriate  Affect:  Appropriate  Cognitive:  Appropriate  Insight: Appropriate  Engagement in Group:  Improving  Modes of Intervention:  Discussion  Additional Comments:  Pt stated his goal for today was to focus on his treatment plan. Pt stated he accomplished his goal today. Pt stated he talked with his doctor and social worker about his care today. Pt rated his overall day a 10. Pt stated he was able to contact his father today and his mother coming for visitation tonight improved his overall day. Pt stated he felt better about himself today. Pt stated he was able to attend all meals. Pt stated he took all medications provided today. Pt stated he attend all groups held today. Pt stated his appetite was pretty good today. Pt rated sleep last night was pretty good. Pt stated the goal tonight was to get some rest. Pt stated he had no physical pain tonight. Pt deny visual hallucinations and auditory issues tonight. Pt denies thoughts of harming himself or others. Pt stated he would alert staff if anything changed  Candy Sledge 08/18/2022, 11:18 PM

## 2022-08-18 NOTE — Progress Notes (Signed)
Admission Note: report received from [place] ED RN patient is a 52 YO VOL male from [facility] Pt was calm and cooperative during assessment. Denies SI/HI/AVH. Admission plan of care reviewed with pt, consent signed.  Personal belongings/skin assessment completed.   No contraband found.  Patient oriented to the unit, staff and room.  Routine safety checks initiated.  Verbalizes understanding of unit rules/protocols.   Patient is presently safe on the unit. No unsafe behaviors noted.  Q 15 minute safety checks maintained per unit protocol.
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
## 2022-08-18 NOTE — Tx Team (Signed)
Initial Treatment Plan 08/18/2022 1:24 AM [name] [id number]    PATIENT STRESSORS: Other: Auditory Hallucinations     PATIENT STRENGTHS: Supportive family/friends    PATIENT IDENTIFIED PROBLEMS: Auditory Hallucinations                     DISCHARGE CRITERIA:  Improved stabilization in mood, thinking, and/or behavior  PRELIMINARY DISCHARGE PLAN: Return to previous living arrangement  PATIENT/FAMILY INVOLVEMENT: This treatment plan has been presented to and reviewed with the patient, Carl [name]., and/or family member, .  The patient and family have been given the opportunity to ask questions and make suggestions.  Cherly Beach, RN 08/18/2022, 1:24 AM

## 2022-08-19 MED ORDER — FLUPHENAZINE HCL 5 MG PO TABS
5.0000 mg | ORAL_TABLET | Freq: Three times a day (TID) | ORAL | Status: DC
  Administered 2022-08-19 – 2022-08-20 (×3): 5 mg via ORAL
  Filled 2022-08-19 (×9): qty 1

## 2022-08-19 MED ORDER — AMLODIPINE BESYLATE 5 MG PO TABS
5.0000 mg | ORAL_TABLET | Freq: Every day | ORAL | Status: DC
  Administered 2022-08-19 – 2022-08-24 (×6): 5 mg via ORAL
  Filled 2022-08-19 (×7): qty 1

## 2022-08-19 MED ORDER — DOCUSATE SODIUM 100 MG PO CAPS
100.0000 mg | ORAL_CAPSULE | Freq: Every day | ORAL | Status: DC
  Administered 2022-08-19 – 2022-08-24 (×6): 100 mg via ORAL
  Filled 2022-08-19 (×7): qty 1
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
## 2022-08-19 NOTE — Progress Notes (Signed)
Adult Psychoeducational Group Note  Date:  08/19/2022 Time:  9:07 PM  Group Topic/Focus:  Wrap-Up Group:   The focus of this group is to help patients review their daily goal of treatment and discuss progress on daily workbooks.  Participation Level:  Active  Participation Quality:  Appropriate  Affect:  Appropriate  Cognitive:  Appropriate  Insight: Appropriate  Engagement in Group:  Engaged  Modes of Intervention:  Education and Exploration  Additional Comments:  Patient attended and participated in group tonight. He reports that today he learn that it will be well with people.  Salley Scarlet [facility] 08/19/2022, 9:07 PM

## 2022-08-19 NOTE — Progress Notes (Signed)
   08/19/22 1400  Psych Admission Type (Psych Patients Only)  Admission Status Voluntary  Psychosocial Assessment  Patient Complaints Anxiety  Eye Contact Fair  Facial Expression Anxious  Affect Preoccupied  Speech Soft  Interaction Assertive  Motor Activity Fidgety  Appearance/Hygiene Unremarkable  Behavior Characteristics Cooperative  Mood Pleasant  Thought Process  Coherency WDL  Content Preoccupation  Delusions None reported or observed  Perception Hallucinations  Hallucination Auditory  Judgment Limited  Confusion Mild  Danger to Self  Current suicidal ideation? Denies  Danger to Others  Danger to Others None reported or observed

## 2022-08-19 NOTE — Group Note (Signed)
Recreation Therapy Group Note   Group Topic:Health and Wellness  Group Date: 08/19/2022 Start Time: [id number] End Time: 1037 Facilitators: Yobani Schertzer-McCall, LRT,CTRS Location: [address] Dayroom   Goal Area(s) Addresses:  Patient will define components of whole wellness. Patient will verbalize benefit of whole wellness.  Group Description:  Exercise.  Each patient picked the songs of their choosing.  For each some the patient chose, that patient had to exercises to that song.  Patients had to option to do exercises such as push ups, sit ups, squats, jumping jacks, etc.   Affect/Mood: N/A   Participation Level: Did not attend    Clinical Observations/Individualized Feedback:     Plan: Continue to engage patient in RT group sessions 2-3x/week.   Sibel Khurana-McCall, LRT,CTRS  08/19/2022 12:36 PM

## 2022-08-19 NOTE — Group Note (Signed)
Occupational Therapy Group Note  Group Topic:Coping Skills  Group Date: 08/19/2022 Start Time: 1400 End Time: 5 Facilitators: Brantley Stage, OT   Group Description: Group encouraged increased engagement and participation through discussion and activity focused on "Coping Ahead." Patients were split up into teams and selected a card from a stack of positive coping strategies. Patients were instructed to act out/charade the coping skill for other peers to guess and receive points for their team. Discussion followed with a focus on identifying additional positive coping strategies and patients shared how they were going to cope ahead over the weekend while continuing hospitalization stay.  Therapeutic Goal(s): Identify positive vs negative coping strategies. Identify coping skills to be used during hospitalization vs coping skills outside of hospital/at home Increase participation in therapeutic group environment and promote engagement in treatment   Participation Level: Minimal   Participation Quality: Minimal Cues   Behavior: Appropriate   Speech/Thought Process: Loose association    Affect/Mood: Flat   Insight: Limited   Judgement: Limited   Individualization: pt was passively engaged in their participation of group discussion/activity. New skills identified  Modes of Intervention: Discussion and Education  Patient Response to Interventions:  Attentive   Plan: Continue to engage patient in OT groups 2 - 3x/week.  08/19/2022  Brantley Stage, OT  Cornell Barman, OT

## 2022-08-19 NOTE — Progress Notes (Signed)
I called pt's mother (contact info and phone # in epic) -  She states that pt does not have a legal guardian but they have been considering this.  She confirmed information from psychiatrist as to reason for admission.  She stated pt was clozapine for 32 years but WBC decr and he was at risk for infection, so it was stopped.  She confirmed that prolixin was helpful and when decreased, pt started to decompensate.  We discussed diagnosis, rationale for admission, details of HPI, medication recommendations, and hospital course thus far. Mother is agreeable with plan.  She had no other questions at the end of the call.

## 2022-08-19 NOTE — BHH Suicide Risk Assessment (Signed)
[place] INPATIENT:  Family/Significant Other Suicide Prevention Education  Suicide Prevention Education:  Education Completed; Barton Canlas [phone] [facility] Phone) ,  (name of family member/significant other) has been identified by the patient as the family member/significant other with whom the patient will be residing, and identified as the person(s) who will aid the patient in the event of a mental health crisis (suicidal ideations/suicide attempt).  With written consent from the patient, the family member/significant other has been provided the following suicide prevention education, prior to the and/or following the discharge of the patient.  The suicide prevention education provided includes the following: Suicide risk factors Suicide prevention and interventions National Suicide Hotline telephone number [facility] assessment telephone number [facility] Emergency Assistance [place] and/or Residential Mobile Crisis Unit telephone number  Request made of family/significant other to: Remove weapons (e.g., guns, rifles, knives), all items previously/currently identified as safety concern.   Remove drugs/medications (over-the-counter, prescriptions, illicit drugs), all items previously/currently identified as a safety concern.  The family member/significant other verbalizes understanding of the suicide prevention education information provided.  The family member/significant other agrees to remove the items of safety concern listed above.  CSW spoke with patient mom who states that she is not patient Legal Guardian, " just his mom ". CSW asked if patient dad was Legal Gaurdian and if they went to court and mom said, " No but that was the plan some time ago ". However, mom said that patient had a appointment with his psychiatrist Steva Colder but ended up at [facility] because psychiatrist recommended it since patient was getting more agitated and the voices he was hearing  were getting worse . Mom said that the voices would not allow patient to have a conversation with them, " they were bad and then he would get agitated with [place], it was a mess". Mom states that patient use to be on Clozaril which helped him out really good but his blood work levels were off so patient was taken off since his levels where not being monitored. Mom does not have any safety concerns, confirmed that their are guns in the home, but all are locked up.   Sherre Lain 08/19/2022, 3:03 PM

## 2022-08-19 NOTE — Progress Notes (Signed)
   08/18/22 2000  Psych Admission Type (Psych Patients Only)  Admission Status Voluntary  Psychosocial Assessment  Patient Complaints Anxiety  Eye Contact Fair  Facial Expression Pensive  Affect Appropriate to circumstance  Speech Soft  Interaction Assertive  Motor Activity Fidgety  Appearance/Hygiene Unremarkable  Behavior Characteristics Cooperative  Mood Pleasant  Thought Process  Coherency WDL  Content WDL  Delusions WDL  Perception WDL  Hallucination Auditory  Judgment Limited  Confusion Mild  Danger to Self  Current suicidal ideation? Denies  Danger to Others  Danger to Others None reported or observed
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
## 2022-08-20 LAB — BASIC METABOLIC PANEL
Anion gap: 12 (ref 5–15)
BUN: 19 mg/dL (ref 6–20)
CO2: 27 mmol/L (ref 22–32)
Calcium: 9.9 mg/dL (ref 8.9–10.3)
Chloride: 102 mmol/L (ref 98–111)
Creatinine, Ser: 1.31 mg/dL — ABNORMAL HIGH (ref 0.61–1.24)
GFR, Estimated: >60 mL/min (ref >60)
Glucose, Bld: 114 mg/dL — ABNORMAL HIGH (ref 70–99)
Potassium: 4 mmol/L (ref 3.5–5.1)
Sodium: 141 mmol/L (ref 135–145)

## 2022-08-20 MED ORDER — FLUPHENAZINE HCL 5 MG PO TABS
7.5000 mg | ORAL_TABLET | Freq: Three times a day (TID) | ORAL | Status: DC
  Administered 2022-08-20 – 2022-08-24 (×11): 7.5 mg via ORAL
  Filled 2022-08-20 (×15): qty 1
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
## 2022-08-20 NOTE — Progress Notes (Signed)
Adult Psychoeducational Group Note  Date:  08/20/2022 Time:  8:29 PM  Group Topic/Focus:  Wrap-Up Group:   The focus of this group is to help patients review their daily goal of treatment and discuss progress on daily workbooks.  Participation Level:  Did Not Attend  Participation Quality:   Did not attend  Affect:  Did Not Attend  Cognitive:  Did Not Attend  Insight: None  Engagement in Group:  Did Not Attend  Modes of Intervention:  Did Not Attend  Additional Comments:  Pt was encouraged to attend wrap up group but did not attend.  Candy Sledge 08/20/2022, 8:29 PM

## 2022-08-20 NOTE — BHH Group Notes (Signed)
Adult Psychoeducational Group Note  Date:  08/20/2022 Time:  9:52 AM  Group Topic/Focus:  Goals Group:   The focus of this group is to help patients establish daily goals to achieve during treatment and discuss how the patient can incorporate goal setting into their daily lives to aide in recovery.  Participation Level:  Did Not Attend    Dub Mikes 08/20/2022, 9:52 AM

## 2022-08-20 NOTE — Progress Notes (Signed)
   08/20/22 1000  Psych Admission Type (Psych Patients Only)  Admission Status Voluntary  Psychosocial Assessment  Patient Complaints Anxiety  Eye Contact Fair  Facial Expression Anxious  Affect Preoccupied  Speech Soft  Interaction Assertive  Motor Activity Fidgety  Appearance/Hygiene Unremarkable  Behavior Characteristics Cooperative  Mood Pleasant  Thought Process  Coherency WDL  Content Preoccupation  Delusions None reported or observed  Perception Hallucinations  Hallucination Auditory  Judgment Limited  Confusion None  Danger to Self  Current suicidal ideation? Denies  Danger to Others  Danger to Others None reported or observed

## 2022-08-20 NOTE — Group Note (Signed)
Recreation Therapy Group Note   Group Topic:Communication  Group Date: 08/20/2022 Start Time: [id number] End Time: 1035 Facilitators: Storm Sovine-McCall, LRT,CTRS Location: [address] Dayroom   Goal Area(s) Addresses:  Patient will effectively listen to complete activity.  Patient will identify communication skills used to make activity successful.  Patient will identify how skills used during activity can be used to reach post d/c goals.     Group Description: Geometric Drawings.  Three volunteers from the peer group will be shown an abstract picture with a particular arrangement of geometrical shapes.  Each round, one 'speaker' will describe the pattern, as accurately as possible without revealing the image to the group.  The remaining group members will listen and draw the picture to reflect how it is described to them. Patients with the role of 'listener' cannot ask clarifying questions but, may request that the speaker repeat a direction. Once the drawings are complete, the presenter will show the rest of the group the picture and compare how close each person came to drawing the picture. LRT will facilitate a post-activity discussion regarding effective communication and the importance of planning, listening, and asking for clarification in daily interactions with others.   Affect/Mood: N/A   Participation Level: Did not attend    Clinical Observations/Individualized Feedback:     Plan: Continue to engage patient in RT group sessions 2-3x/week.   Carl [name], LRT,CTRS 08/20/2022 11:55 AM

## 2022-08-20 NOTE — Plan of Care (Signed)
  Problem: Coping: Goal: Ability to verbalize frustrations and anger appropriately will improve Outcome: Progressing   Problem: Physical Regulation: Goal: Ability to maintain clinical measurements within normal limits will improve Outcome: Progressing   Problem: Safety: Goal: Periods of time without injury will increase Outcome: Progressing  Patient appears to be responding to internal stimuli denies SI/HI and verbally contracted for safety. Compliant with medications. Patient visible in dayroom interacted well with Peers and Staff. Support and encouragement provided.

## 2022-08-21 NOTE — BHH Group Notes (Signed)
Goals Group 08/21/22   Group Focus: affirmation, clarity of thought, and goals/reality orientation Treatment Modality:  Psychoeducation Interventions utilized were assignment, group exercise, and support Purpose: To be able to understand and verbalize the reason for their admission to the hospital. To understand that the medication helps with their chemical imbalance but they also need to work on their choices in life. To be challenged to develop a list of 30 positives about themselves. Also introduce the concept that "feelings" are not reality.  Participation Level:  did not attend  Carl [name]

## 2022-08-21 NOTE — Progress Notes (Addendum)
Pt visible in milieu at intervals during shift. Denies SI, HI, VH and pain when assessed. Continues to endorse +AH but reports improvement "It's better than when I first came in. It's not loud and they are telling me good things". Observed to be restless and anxious on interactions but is pleasant. Showered X3, stayed in the bathroom at longer intervals this shift. Did not attend scheduled groups despite multiple prompts. Remains medication compliant without adverse drug reactions.  Support and reassurance offered. Tolerates meals and fluids well. Off unit to cafeteria for meals and returned to unit without issues.
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
## 2022-08-21 NOTE — Progress Notes (Signed)
Psychoeducational Group Note  Date:  08/21/2022 Time:  2019  Group Topic/Focus:  Crisis Planning:   The purpose of this group is to help patients create a crisis plan for use upon discharge or in the future, as needed.  Participation Level: Did Not Attend  Participation Quality:  Not Applicable  Affect:  Not Applicable  Cognitive:  Not Applicable  Insight:  Not Applicable  Engagement in Group: Not Applicable  Additional Comments:  The patient did not attend group this evening.   Archie Balboa S 08/21/2022, 8:19 PM

## 2022-08-21 NOTE — Progress Notes (Signed)

## 2022-08-22 DIAGNOSIS — F209 Schizophrenia, unspecified: Principal | ICD-10-CM

## 2022-08-22 LAB — BASIC METABOLIC PANEL WITH GFR
Anion gap: 10 (ref 5–15)
BUN: 13 mg/dL (ref 6–20)
CO2: 25 mmol/L (ref 22–32)
Calcium: 9.2 mg/dL (ref 8.9–10.3)
Chloride: 103 mmol/L (ref 98–111)
Creatinine, Ser: 1.11 mg/dL (ref 0.61–1.24)
GFR, Estimated: >60 mL/min
Glucose, Bld: 94 mg/dL (ref 70–99)
Potassium: 4 mmol/L (ref 3.5–5.1)
Sodium: 138 mmol/L (ref 135–145)

## 2022-08-22 LAB — LIPID PANEL
Cholesterol: 176 mg/dL (ref 0–200)
HDL: 43 mg/dL (ref >40)
LDL Cholesterol: 107 mg/dL — ABNORMAL HIGH (ref 0–99)
Total CHOL/HDL Ratio: 4.1 RATIO
Triglycerides: 129 mg/dL (ref <150)
VLDL: 26 mg/dL (ref 0–40)

## 2022-08-22 LAB — HEMOGLOBIN A1C
Hgb A1c MFr Bld: 5.1 % (ref 4.8–5.6)
Mean Plasma Glucose: 99.67 mg/dL

## 2022-08-22 LAB — TSH: TSH: 2.016 u[IU]/mL (ref 0.350–4.500)

## 2022-08-22 LAB — VITAMIN B12: Vitamin B-12: 320 pg/mL (ref 180–914)

## 2022-08-22 NOTE — Progress Notes (Signed)
   08/22/22 2200  Psych Admission Type (Psych Patients Only)  Admission Status Voluntary  Psychosocial Assessment  Patient Complaints Anxiety  Eye Contact Fair  Facial Expression Anxious  Affect Preoccupied  Speech Soft  Interaction Assertive  Motor Activity Fidgety  Appearance/Hygiene Unremarkable  Behavior Characteristics Cooperative  Mood Anxious;Pleasant  Aggressive Behavior  Effect No apparent injury  Thought Process  Coherency WDL  Content Preoccupation  Delusions None reported or observed  Perception Hallucinations  Hallucination None reported or observed  Judgment Impaired  Confusion None  Danger to Self  Current suicidal ideation? Denies  Danger to Others  Danger to Others None reported or observed   Alert/oriented. Makes needs/concerns known to staff. Pleasant cooperative with staff. Denies SI/HI/A/V hallucinations. Med compliant. PRN med given with good effect. Patient states went to group. Will encourage continue compliance and progression towards goals. Verbally contracted for safety. Will continue to monitor.

## 2022-08-22 NOTE — Progress Notes (Signed)
Pt out of bed for meals and in dayroom at short intervals during shift. More interactive with peers and staff this evening singing and dancing to music with bright affect. Observed talking to self, pointing to floor and giggling inappropriately at times when not participating in activities. Denies SI, HI, VH and pain. Still endorsing AH but is pleasant when engaged. Reports he slept well with good appetite. Emotional support, encouragement and reassurance provided to pt. Safety checks maintained at Q 15 minutes intervals without issues thus far. Off unit to gym and meals with peers, returned without issues. Safety maintained in milieu.
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
## 2022-08-22 NOTE — Progress Notes (Signed)
Adult Psychoeducational Group Note  Date:  08/22/2022 Time:  9:00 PM  Group Topic/Focus:  Wrap-Up Group:   The focus of this group is to help patients review their daily goal of treatment and discuss progress on daily workbooks.  Participation Level:  Active  Participation Quality:  Appropriate  Affect:  Appropriate  Cognitive:  Appropriate  Insight: Appropriate  Engagement in Group:  Developing/Improving  Modes of Intervention:  Discussion  Additional Comments:   Pt stated his goal for today was to focus on his treatment plan. Pt stated he accomplished his goal today. Pt stated he talked with his doctor and social worker about his care today. Pt rated his overall day a 10. Pt stated his mother coming for visitation tonight improved his overall day. Pt stated he felt better about himself today. Pt stated he was able to attend all meals. Pt stated he took all medications provided today. Pt stated he attend all groups held today. Pt stated his appetite was pretty good today. Pt rated sleep last night was pretty good. Pt stated the goal tonight was to get some rest. Pt stated he had no physical pain tonight. Pt deny visual hallucinations and auditory issues tonight. Pt denies thoughts of harming himself or others. Pt stated he would alert staff if anything changed   Candy Sledge 08/22/2022, 9:00 PM

## 2022-08-23 ENCOUNTER — Encounter (HOSPITAL_COMMUNITY): Payer: Self-pay

## 2022-08-23 LAB — URINALYSIS, ROUTINE W REFLEX MICROSCOPIC
Bilirubin Urine: NEGATIVE
Glucose, UA: NEGATIVE mg/dL
Hgb urine dipstick: NEGATIVE
Ketones, ur: NEGATIVE mg/dL
Leukocytes,Ua: NEGATIVE
Nitrite: NEGATIVE
Protein, ur: NEGATIVE mg/dL
Specific Gravity, Urine: 1.006 (ref 1.005–1.030)
pH: 5 (ref 5.0–8.0)

## 2022-08-23 NOTE — Group Note (Signed)
LCSW Group Therapy Note  Group Date: 08/23/2022 Start Time: 1300 End Time: 1400   Type of Therapy and Topic:  Group Therapy - How To Cope with Nervousness about Discharge   Participation Level:  Did Not Attend   Description of Group This process group involved identification of patients' feelings about discharge. Some of them are scheduled to be discharged soon, while others are new admissions, but each of them was asked to share thoughts and feelings surrounding discharge from the hospital. One common theme was that they are excited at the prospect of going home, while another was that many of them are apprehensive about sharing why they were hospitalized. Patients were given the opportunity to discuss these feelings with their peers in preparation for discharge.  Therapeutic Goals  Patient will identify their overall feelings about pending discharge. Patient will think about how they might proactively address issues that they believe will once again arise once they get home (i.e. with parents). Patients will participate in discussion about having hope for change.   Summary of Patient Progress:  x   Therapeutic Modalities Cognitive Behavioral Therapy   Zachery Conch, LCSW 08/23/2022  2:09 PM

## 2022-08-23 NOTE — Progress Notes (Signed)
   08/23/22 2208  Psych Admission Type (Psych Patients Only)  Admission Status Voluntary  Psychosocial Assessment  Patient Complaints Anxiety  Eye Contact Fair  Facial Expression Anxious  Affect Preoccupied  Speech Soft  Interaction Assertive  Motor Activity Fidgety  Appearance/Hygiene Unremarkable  Behavior Characteristics Cooperative  Mood Anxious;Pleasant  Aggressive Behavior  Effect No apparent injury  Thought Process  Coherency WDL  Content Preoccupation  Delusions None reported or observed  Perception Hallucinations  Hallucination None reported or observed  Judgment Impaired  Confusion None  Danger to Self  Current suicidal ideation? Denies  Danger to Others  Danger to Others None reported or observed   Alert/oriented. Makes needs/concerns known to staff. Pleasant cooperative with staff. Denies SI/HI/A/V hallucinations. Med complian. Patient states went to group. Will encourage continue compliance and progression towards goals. Verbally contracted for safety. Will continue to monitor. States is going home tomorrow.

## 2022-08-23 NOTE — Plan of Care (Signed)
  Problem: Safety: Goal: Ability to remain free from injury will improve Outcome: Progressing   
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
## 2022-08-23 NOTE — BH IP Treatment Plan (Signed)
Interdisciplinary Treatment and Diagnostic Plan Update  08/23/2022 Time of Session: 10:00am  Carl [name]. MRN: [id number]  Principal Diagnosis: Schizophrenia ([place])  Secondary Diagnoses: Principal Problem:   Schizophrenia ([place])   Current Medications:  Current Facility-Administered Medications  Medication Dose Route Frequency Provider Last Rate Last Admin   amLODipine (NORVASC) tablet 5 mg  5 mg Oral Daily Massengill, Nathan, MD   5 mg at 08/23/22 [id number]   docusate sodium (COLACE) capsule 100 mg  100 mg Oral Daily Massengill, Ovid Curd, MD   100 mg at 08/23/22 0823   famotidine (PEPCID) tablet 20 mg  20 mg Oral BID Caroline Sauger, NP   20 mg at 08/23/22 [id number]   fluPHENAZine (PROLIXIN) tablet 7.5 mg  7.5 mg Oral Q8H Massengill, Ovid Curd, MD   7.5 mg at 08/23/22 0610   loratadine (CLARITIN) tablet 10 mg  10 mg Oral Daily Caroline Sauger, NP   10 mg at 08/23/22 0824   OLANZapine zydis (ZYPREXA) disintegrating tablet 5 mg  5 mg Oral Q8H PRN Massengill, Ovid Curd, MD       And   LORazepam (ATIVAN) tablet 1 mg  1 mg Oral PRN Massengill, Ovid Curd, MD       And   ziprasidone (GEODON) injection 20 mg  20 mg Intramuscular PRN Massengill, Ovid Curd, MD       magnesium hydroxide (MILK OF MAGNESIA) suspension 30 mL  30 mL Oral Daily PRN Caroline Sauger, NP       QUEtiapine (SEROQUEL XR) 24 hr tablet 400 mg  400 mg Oral QHS Ntuen, Tina C, FNP   400 mg at 08/22/22 2104   traZODone (DESYREL) tablet 100 mg  100 mg Oral QHS PRN Caroline Sauger, NP   100 mg at 08/22/22 2105   PTA Medications: Medications Prior to Admission  Medication Sig Dispense Refill Last Dose   benztropine (COGENTIN) 1 MG tablet Take 1 tablet (1 mg total) by mouth daily. 30 tablet 3    cetirizine (ZYRTEC) 10 MG tablet Take 10 mg by mouth daily as needed for allergies.      docusate sodium (COLACE) 100 MG capsule Take 1 capsule (100 mg total) by mouth daily. (Patient taking differently: Take 100 mg by mouth 2 (two) times  daily.) 30 capsule 1    famotidine (PEPCID) 20 MG tablet Take 20 mg by mouth 2 (two) times daily.      fluPHENAZine (PROLIXIN) 2.5 MG tablet Take 1 tablet (2.5 mg total) by mouth daily after lunch. DOSE CHANGE , PLEASE STOP FLUPHENAZINE 5 MG 30 tablet 2    fluticasone (FLONASE) 50 MCG/ACT nasal spray Place 2 sprays into the nose daily as needed for allergies.       QUEtiapine (SEROQUEL XR) 300 MG 24 hr tablet Take 1 tablet (300 mg total) by mouth at bedtime. 30 tablet 3    QUEtiapine (SEROQUEL XR) 50 MG TB24 24 hr tablet Take 2 tablets (100 mg total) by mouth at bedtime. Take along with 300 mg at bedtime, total of 400 mg . 60 tablet 2    traZODone (DESYREL) 100 MG tablet Take 1 tablet (100 mg total) by mouth at bedtime as needed for sleep. Use only as needed for sleep 30 tablet 3     Patient Stressors: Other: Auditory Hallucinations    Patient Strengths: Supportive family/friends   Treatment Modalities: Medication Management, Group therapy, Case management,  1 to 1 session with clinician, Psychoeducation, Recreational therapy.   Physician Treatment Plan for Primary Diagnosis: Schizophrenia [facility]) Long Term Goal(s):  Short Term Goals:    Medication Management: Evaluate patient's response, side effects, and tolerance of medication regimen.  Therapeutic Interventions: 1 to 1 sessions, Unit Group sessions and Medication administration.  Evaluation of Outcomes: Progressing  Physician Treatment Plan for Secondary Diagnosis: Principal Problem:   Schizophrenia ([place])  Long Term Goal(s):     Short Term Goals:       Medication Management: Evaluate patient's response, side effects, and tolerance of medication regimen.  Therapeutic Interventions: 1 to 1 sessions, Unit Group sessions and Medication administration.  Evaluation of Outcomes: Progressing   RN Treatment Plan for Primary Diagnosis: Schizophrenia ([place]) Long Term Goal(s): Knowledge of disease and therapeutic regimen to  maintain health will improve  Short Term Goals: Ability to remain free from injury will improve, Ability to participate in decision making will improve, Ability to verbalize feelings will improve, Ability to disclose and discuss suicidal ideas, and Ability to identify and develop effective coping behaviors will improve  Medication Management: RN will administer medications as ordered by provider, will assess and evaluate patient's response and provide education to patient for prescribed medication. RN will report any adverse and/or side effects to prescribing provider.  Therapeutic Interventions: 1 on 1 counseling sessions, Psychoeducation, Medication administration, Evaluate responses to treatment, Monitor vital signs and CBGs as ordered, Perform/monitor CIWA, COWS, AIMS and Fall Risk screenings as ordered, Perform wound care treatments as ordered.  Evaluation of Outcomes: Progressing   LCSW Treatment Plan for Primary Diagnosis: Schizophrenia ([place]) Long Term Goal(s): Safe transition to appropriate next level of care at discharge, Engage patient in therapeutic group addressing interpersonal concerns.  Short Term Goals: Engage patient in aftercare planning with referrals and resources, Increase social support, Increase emotional regulation, Facilitate acceptance of mental health diagnosis and concerns, Identify triggers associated with mental health/substance abuse issues, and Increase skills for wellness and recovery  Therapeutic Interventions: Assess for all discharge needs, 1 to 1 time with Social worker, Explore available resources and support systems, Assess for adequacy in community support network, Educate family and significant other(s) on suicide prevention, Complete Psychosocial Assessment, Interpersonal group therapy.  Evaluation of Outcomes: Progressing   Progress in Treatment: Attending groups: Yes. Participating in groups: Yes. Taking medication as prescribed: Yes. Toleration  medication: Yes. Family/Significant other contact made: Yes, individual(s) contacted:  Mother and father  Patient understands diagnosis: No. Discussing patient identified problems/goals with staff: Yes. Medical problems stabilized or resolved: Yes. Denies suicidal/homicidal ideation: Yes. Issues/concerns per patient self-inventory: No.   New problem(s) identified: No, Describe:  None   New Short Term/Long Term Goal(s): medication stabilization, elimination of SI thoughts, development of comprehensive mental wellness plan.   Patient Goals:  Did not attend   Discharge Plan or Barriers: Patient recently admitted. CSW will continue to follow and assess for appropriate referrals and possible discharge planning.   Reason for Continuation of Hospitalization: Anxiety Hallucinations Medication stabilization  Estimated Length of Stay: 3 to 7 days   Last 3 [place] Suicide Severity Risk Score: [place] Admission (Current) from 08/18/2022 in [facility] [facility] ED from 08/17/2022 in [facility] Emergency Department at [facility] Visit from 05/18/2022 in confidential department  C-SSRS RISK CATEGORY No Risk No Risk No Risk       Last PHQ 2/9 Scores:    05/18/2022    1:24 PM 03/02/2022    1:40 PM 01/21/2022    4:07 PM  Depression screen PHQ 2/9  Decreased Interest 0 0 0  Down, Depressed, Hopeless 0 0  0  PHQ - 2 Score 0 0 0  Altered sleeping 0    Tired, decreased energy 0    Change in appetite 0    Feeling bad or failure about yourself  0    Trouble concentrating 0    Moving slowly or fidgety/restless 0    Suicidal thoughts 0    PHQ-9 Score 0    Difficult doing work/chores Not difficult at all      Scribe for Treatment Team: Darleen Crocker, Latanya Presser 08/23/2022 1:17 PM

## 2022-08-23 NOTE — Progress Notes (Signed)
Patient denies SI, HI and AVH this shift. Patient has had no incidents of behavioral dyscontrol. Patient has participated in group activities and has been calm and cooperative this shift.   Assess patient for safety, offer medications as prescribed, engage patient with 1:1 staff talks.   Continue to monitor as planned patient able to contract for safety.

## 2022-08-23 NOTE — Progress Notes (Signed)
Adult Psychoeducational Group Note  Date:  08/23/2022 Time:  8:23 PM  Group Topic/Focus:  Wrap-Up Group:   The focus of this group is to help patients review their daily goal of treatment and discuss progress on daily workbooks.  Participation Level:  Active  Participation Quality:  Appropriate  Affect:  Appropriate  Cognitive:  Appropriate  Insight: Appropriate  Engagement in Group:  Engaged  Modes of Intervention:  Discussion  Additional Comments:   Pt states that he had a good day and enjoyed the visit he received from his brother today. Pt expressed excitement about his D/C tomorrow and states that he's excited to lay in his own bed and plans to stay on his medications. Pt denies everything.   Gerhard Perches 08/23/2022, 8:23 PM

## 2022-08-23 NOTE — Group Note (Signed)
Recreation Therapy Group Note   Group Topic:Healthy Decision Making  Group Date: 08/23/2022 Start Time: [id number] End Time: 1100 Facilitators: Yiannis Tulloch-McCall, LRT,CTRS Location: 500 [address] Dayroom   Goal Area(s) Addresses:  Patient will effectively work with peer towards shared goal.  Patient will identify factors that guided their decision making.  Patient will pro-socially communicate ideas during group session.   Group Description:  Patients were given a scenario that they were going to be stranded on a deserted [place] for several months before being rescued. Writer tasked them with making a list of 15 things they would choose to bring with them for "survival". The list of items was prioritized most important to least. Each patient would come up with their own list, then work together to create a new list of 15 items while in a group of 3-5 peers. LRT discussed each person's list and how it differed from others. The debrief included discussion of priorities, good decisions versus bad decisions, and how it is important to think before acting so we can make the best decision possible. LRT tied the concept of effective communication among group members to patient's support systems outside of the hospital and its benefit post discharge.   Affect/Mood: N/A   Participation Level: Did not attend    Clinical Observations/Individualized Feedback:     Plan: Continue to engage patient in RT group sessions 2-3x/week.   Nea Gittens-McCall, LRT,CTRS 08/23/2022 1:59 PM

## 2022-08-24 DIAGNOSIS — F200 Paranoid schizophrenia: Secondary | ICD-10-CM

## 2022-08-24 MED ORDER — QUETIAPINE FUMARATE ER 400 MG PO TB24: 400.0000 mg | ORAL_TABLET | Freq: Every day | ORAL | 0 refills | Status: DC

## 2022-08-24 MED ORDER — AMLODIPINE BESYLATE 5 MG PO TABS: 5.0000 mg | ORAL_TABLET | Freq: Every day | ORAL | 0 refills | Status: DC

## 2022-08-24 MED ORDER — TRAZODONE HCL 100 MG PO TABS: 100.0000 mg | ORAL_TABLET | Freq: Every evening | ORAL | 0 refills | Status: DC | PRN

## 2022-08-24 MED ORDER — FLUPHENAZINE HCL 2.5 MG PO TABS: 7.5000 mg | ORAL_TABLET | Freq: Three times a day (TID) | ORAL | 0 refills | Status: DC

## 2022-08-24 NOTE — Progress Notes (Signed)
[facility] LCSW Progress Note   08/24/2022    10:34 AM  [name]      Type of Note: Medicare Notice Given   Patient informed of right to appeal discharge, provided phone number to [facility]. Patient expressed no interest in appealing discharge at this time. CSW will continue to monitor situation.     Signed:   Silas Flood, MSW, [facility] 08/24/2022 10:34 AM
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
## 2022-08-24 NOTE — Progress Notes (Signed)
Pt discharged to lobby. Pt was stable and appreciative at that time. All papers and prescriptions were given and valuables returned. Verbal understanding expressed. Denies SI/HI and A/VH. Pt given opportunity to express concerns and ask questions.  

## 2022-08-24 NOTE — Plan of Care (Signed)
Patient was unable to identify coping skills due to not attending recreational therapy group sessions.   Carl [name], LRT,CTRS

## 2022-08-24 NOTE — Progress Notes (Signed)
  [facility] Adult Case Management Discharge Plan :  Will you be returning to the same living situation after discharge:  Yes,  Patient will return back with his parents  At discharge, do you have transportation home?: Yes,  Patient parents will pick him up today at 11:30 AM.  Do you have the ability to pay for your medications: Yes,  Patient has [employer]   Release of information consent forms completed and in the chart;  Patient's signature needed at discharge.  Patient to Follow up at:  [facility] Follow up on 09/07/2022.   Why: You have an appointment for therapy services on 09/07/22 at 7:00 pm.  This will be a Virtual appointment, but you may call to switch to in person. Contact information: [place], [place], [place] [address] [phone]         [facility] Follow up on 09/09/2022.   Specialty: Behavioral Health Why: You have an appointment for medication management services with Dr. Shea Evans on 09-09-22 at 2:00 pm. Contact information: [place] [phone]                Next level of care provider has access to [facility] and Suicide Prevention discussed: Yes,  [name] or [name] ( LG) [phone] [facility] Phone)     Has patient been referred to the Quitline?: N/A patient is not a smoker  Patient has been referred for addiction treatment: N/A  Charlett Lango 08/24/2022, 9:38 AM

## 2022-08-24 NOTE — Discharge Instructions (Signed)
-  Follow-up with your outpatient psychiatric provider -instructions on appointment date, time, and address (location) are provided to you in discharge paperwork.  -Take your psychiatric medications as prescribed at discharge - instructions are provided to you in the discharge paperwork  -Follow-up with outpatient primary care doctor and other specialists -for management of preventative medicine and any chronic medical disease.  -Recommend abstinence from alcohol, tobacco, and other illicit drug use at discharge.   -If your psychiatric symptoms recur, worsen, or if you have side effects to your psychiatric medications, call your outpatient psychiatric provider, 911, 988 or go to the nearest emergency department.  -If suicidal thoughts occur, call your outpatient psychiatric provider, 911, 988 or go to the nearest emergency department.  Naloxone (Narcan) can help reverse an overdose when given to the victim quickly.  [place] offers free naloxone kits and instructions/training on its use.  Add naloxone to your first aid kit and you can help save a life.   Pick up your free kit at the following locations:   [place]:  [facility], [address] [place] [address] ([phone]) [facility] [address] [place] [address] ([phone]) [facility] [address] [place] [address]  [place]: [facility] [address] [place] [address] ([phone]) [facility] [address] [place] [address] ([phone])  

## 2022-08-24 NOTE — Group Note (Signed)
Date:  08/24/2022 Time:  10:52 AM  Group Topic/Focus:  Goals Group:   The focus of this group is to help patients establish daily goals to achieve during treatment and discuss how the patient can incorporate goal setting into their daily lives to aide in recovery.    Participation Level:  Did Not Attend   Iona Coach Jasiyah Poland [id number], 10:52 AM

## 2022-08-24 NOTE — Progress Notes (Signed)
Recreation Therapy Notes  INPATIENT RECREATION TR PLAN  Patient Details Name: Carl [name]. MRN: [id number] DOB: [birth year] Today's Date: 08/24/2022  Rec Therapy Plan Is patient appropriate for Therapeutic Recreation?: Yes Treatment times per week: about 3 days Estimated Length of Stay: 5-7 days TR Treatment/Interventions: Group participation (Comment)  Discharge Criteria Pt will be discharged from therapy if:: Discharged Treatment plan/goals/alternatives discussed and agreed upon by:: Patient/family  Discharge Summary Short term goals set: See patient care plan Short term goals met: Not met Reason goals not met: Patient did not attend group sessions. Therapeutic equipment acquired: N/A Reason patient discharged from therapy: Discharge from hospital Pt/family agrees with progress & goals achieved: Yes Date patient discharged from therapy: 08/24/22   Lisa Milian-McCall, LRT,CTRS Kern Gingras A Carley Glendenning-McCall 08/24/2022, 1:17 PM
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
## 2022-08-26 LAB — VITAMIN B1: Vitamin B1 (Thiamine): 87.7 nmol/L (ref 66.5–200.0)

## 2022-08-26 LAB — VITAMIN D 25 HYDROXY (VIT D DEFICIENCY, FRACTURES)

## 2022-09-09 ENCOUNTER — Encounter: Payer: Self-pay | Admitting: Psychiatry

## 2022-09-09 ENCOUNTER — Ambulatory Visit (INDEPENDENT_AMBULATORY_CARE_PROVIDER_SITE_OTHER): Payer: 59 | Admitting: Psychiatry

## 2022-09-09 VITALS — BP 118/83 | HR 97 | Temp 97.3°F | Ht 66.0 in | Wt 154.8 lb

## 2022-09-09 DIAGNOSIS — F209 Schizophrenia, unspecified: Secondary | ICD-10-CM | POA: Diagnosis not present

## 2022-09-09 DIAGNOSIS — G4700 Insomnia, unspecified: Secondary | ICD-10-CM | POA: Diagnosis not present

## 2022-09-09 MED ORDER — TRAZODONE HCL 100 MG PO TABS: 150.0000 mg | ORAL_TABLET | Freq: Every evening | ORAL | 0 refills | Status: DC | PRN

## 2022-09-09 MED ORDER — QUETIAPINE FUMARATE ER 400 MG PO TB24: 400.0000 mg | ORAL_TABLET | Freq: Every day | ORAL | 1 refills | Status: DC

## 2022-09-09 MED ORDER — FLUPHENAZINE HCL 2.5 MG PO TABS: 7.5000 mg | ORAL_TABLET | Freq: Two times a day (BID) | ORAL | 1 refills | Status: DC

## 2022-09-09 NOTE — Progress Notes (Signed)
[place] MD OP Progress Note  09/09/2022 4:00 PM Carl [name].  MRN:  [id number]  Chief Complaint:  Chief Complaint  Patient presents with   Follow-up   Medication Refill   Hallucinations   Anxiety   HPI: Carl [name]. Is a 52 year old African-American male, single, lives in [place], has a history of schizophrenia, multiple psychotropic medications, presented for medication management.  Patient being a limited historian collateral information obtained from mother-Pamela.  Patient today reports he continues to have auditory hallucinations although he is unable to elaborate on what these voices are telling him.  Patient reports overall he has been doing well otherwise.  Has been spending time watching TV.  Patient appeared to be alert, oriented to person place time and situation.  Patient denies any suicidality, homicidality.  Did not express any side effects to medications.  Per collateral information obtained from mother, patient was discharged from behavioral health unit in [place] on a higher dosage of Prolixin, 7.5 mg 3 times a day.  He however has been taking it only 7.5 mg at 10 AM and 7.5 mg at 5 PM.  With the higher dosage he felt extremely sluggish and drowsy throughout the day and appeared to be slowed down with his thinking.  Mother believes he is currently doing well on this dosage except for his sleep.  Although he is also taking trazodone he has not been sleeping since the past few nights.  She tried giving him melatonin however that also does not work all the time.  Otherwise denies any concerns.  Visit Diagnosis:    ICD-10-CM   1. Schizophrenia, chronic condition with acute exacerbation (HCC)  F20.9 QUEtiapine (SEROQUEL XR) 400 MG 24 hr tablet    traZODone (DESYREL) 100 MG tablet    fluPHENAZine (PROLIXIN) 2.5 MG tablet    2. Insomnia, unspecified type  G47.00 traZODone (DESYREL) 100 MG tablet      Past Psychiatric History: Reviewed past psychiatric history from  progress note on 09/09/2021.  Reviewed past psychiatric history from progress note on 08/21/2021.  Recent inpatient behavioral health admission at behavioral health [facility] [place]-08/17/2022 - 08/18/2022  Past Medical History:  Past Medical History:  Diagnosis Date   Abdominal pain    Anxiety    Constipation    Decreased visual acuity    Nevus    Polyuria    Recurrent boils    scalp behind ear   Schizophrenia (HCC)    Seizures ([place]) 08/15/1990   due to haldol   Soft tissue mass    left scalp posterior    Past Surgical History:  Procedure Laterality Date   COLONOSCOPY WITH PROPOFOL N/A 02/14/2020   Procedure: COLONOSCOPY WITH PROPOFOL;  Surgeon: Virgel Manifold, MD;  Location: [facility] ENDOSCOPY;  Service: Endoscopy;  Laterality: N/A;   COLONOSCOPY WITH PROPOFOL N/A 02/28/2020   Procedure: COLONOSCOPY WITH PROPOFOL;  Surgeon: Virgel Manifold, MD;  Location: [facility] ENDOSCOPY;  Service: Endoscopy;  Laterality: N/A;   COLONOSCOPY WITH PROPOFOL N/A 03/18/2021   Procedure: COLONOSCOPY WITH PROPOFOL;  Surgeon: Virgel Manifold, MD;  Location: [facility] ENDOSCOPY;  Service: Endoscopy;  Laterality: N/A;   LAPAROSCOPIC RIGHT COLECTOMY Right 03/11/2020   Procedure: LAPAROSCOPIC RIGHT COLECTOMY, possible open;  Surgeon: Olean Ree, MD;  Location: [facility] ORS;  Service: General;  Laterality: Right;   MASS EXCISION Left 02/11/2015   Procedure: EXCISION MASS, left post neck;  Surgeon: Florene Glen, MD;  Location: [place];  Service: General;  Laterality: Left;  Family Psychiatric History: Reviewed family psychiatric history from progress note on 08/21/2021.  Family History:  Family History  Problem Relation Age of Onset   Hyperlipidemia Mother    Hypertension Mother    Fibromyalgia Mother    Hypertension Father    Diabetes Father    Hyperlipidemia Father    Mental illness Neg Hx     Social History: Reviewed so show history from progress note on 08/21/2021. Social History    Socioeconomic History   Marital status: Single    Spouse name: Not on file   Number of children: Not on file   Years of education: Not on file   Highest education level: 12th grade  Occupational History   Occupation: on disability  Tobacco Use   Smoking status: Never   Smokeless tobacco: Never  Vaping Use   Vaping Use: Never used  Substance and Sexual Activity   Alcohol use: No    Alcohol/week: 0.0 standard drinks of alcohol   Drug use: No   Sexual activity: Not Currently  Other Topics Concern   Not on file  Social History Narrative   Not on file   Social Determinants of Health   Financial Resource Strain: Not on file  Food Insecurity: No Food Insecurity (08/18/2022)   Hunger Vital Sign    Worried About Running Out of Food in the Last Year: Never true    Ran Out of Food in the Last Year: Never true  Transportation Needs: No Transportation Needs (08/18/2022)   PRAPARE - Hydrologist (Medical): No    Lack of Transportation (Non-Medical): No  Physical Activity: Not on file  Stress: Not on file  Social Connections: Not on file    Allergies:  Allergies  Allergen Reactions   Haldol [Haloperidol] Other (See Comments)    Causes seizures    Metabolic Disorder Labs: Lab Results  Component Value Date   HGBA1C 5.1 08/22/2022   MPG 99.67 08/22/2022   MPG 111 08/24/2021   Lab Results  Component Value Date   PROLACTIN 41.2 (H) 08/24/2021   Lab Results  Component Value Date   CHOL 176 08/22/2022   TRIG 129 08/22/2022   HDL 43 08/22/2022   CHOLHDL 4.1 08/22/2022   VLDL 26 08/22/2022   LDLCALC 107 (H) 08/22/2022   LDLCALC 149 (H) 08/24/2021   Lab Results  Component Value Date   TSH 2.016 08/22/2022   TSH 1.047 08/24/2021    Therapeutic Level Labs: No results found for: "LITHIUM" Lab Results  Component Value Date   VALPROATE 97 04/28/2019   VALPROATE 99 09/09/2017   No results found for: "CBMZ"  Current Medications: Current  Outpatient Medications  Medication Sig Dispense Refill   amLODipine (NORVASC) 5 MG tablet Take 1 tablet (5 mg total) by mouth daily. 30 tablet 0   cetirizine (ZYRTEC) 10 MG tablet Take 10 mg by mouth daily as needed for allergies.     docusate sodium (COLACE) 100 MG capsule Take 1 capsule (100 mg total) by mouth daily. (Patient taking differently: Take 100 mg by mouth 2 (two) times daily.) 30 capsule 1   famotidine (PEPCID) 20 MG tablet Take 20 mg by mouth 2 (two) times daily.     fluticasone (FLONASE) 50 MCG/ACT nasal spray Place 2 sprays into the nose daily as needed for allergies.      fluPHENAZine (PROLIXIN) 2.5 MG tablet Take 3 tablets (7.5 mg total) by mouth 2 (two) times daily. 180 tablet 1  QUEtiapine (SEROQUEL XR) 400 MG 24 hr tablet Take 1 tablet (400 mg total) by mouth at bedtime. 30 tablet 1   traZODone (DESYREL) 100 MG tablet Take 1.5-2 tablets (150-200 mg total) by mouth at bedtime as needed for sleep. Use only as needed for sleep 60 tablet 0   No current facility-administered medications for this visit.     Musculoskeletal: Strength & Muscle Tone: within normal limits Gait & Station: normal Patient leans: N/A  Psychiatric Specialty Exam: Review of Systems  Unable to perform ROS: Psychiatric disorder    Blood pressure 118/83, pulse 97, temperature (!) 97.3 F (36.3 C), temperature source Skin, height '5\' 6"'$  (1.676 m), weight 154 lb 12.8 oz (70.2 kg).Body mass index is 24.99 kg/m.  General Appearance: Casual  Eye Contact:  Fair  Speech:  Clear and Coherent  Volume:  Normal  Mood:  Euthymic  Affect:  Flat  Thought Process:  Linear and Descriptions of Associations: Intact  Orientation:  Full (Time, Place, and Person)  Thought Content: Hallucinations: Auditory improving cannot make out what the voices are saying  Suicidal Thoughts:  No  Homicidal Thoughts:  No  Memory:  Immediate;   Fair Recent;   Fair Remote;   Poor  Judgement:  Fair  Insight:  Shallow   Psychomotor Activity:  Decreased  Concentration:  Concentration: Fair and Attention Span: Fair  Recall:  [employer] of Knowledge: Fair  Language: Fair  Akathisia:  No  Handed:  Right  AIMS (if indicated): done  Assets:  Communication Skills Desire for Improvement Housing Social Support  ADL's:  Intact  Cognition: WNL  Sleep:  Poor   Screenings: [place] Office Visit from 09/09/2022 in [place] Admission (Discharged) from 08/18/2022 in [facility] [place] [facility] Office Visit from 05/18/2022 in [place] Office Visit from 03/02/2022 in [place] Office Visit from 01/21/2022 in [place] Total Score 0 0 0 0 0      AUDIT    Flowsheet Row Admission (Discharged) from 08/18/2022 in [facility] [facility] Admission (Discharged) from 09/05/2017 in [place]  Alcohol Use Disorder Identification Test Final Score (AUDIT) 0 0      GAD-7    Flowsheet Row Office Visit from 09/09/2022 in [facility] Office Visit from 05/18/2022 in [facility] [place] Office Visit from 03/02/2022 in [place] Office Visit from 01/21/2022 in [facility] Office Visit from 08/21/2021 in [facility]  Total GAD-7 Score 6 0 0 0 0      PHQ2-9    [place] Visit from 09/09/2022 in [place] Office Visit from 05/18/2022 in [place] Office Visit from 03/02/2022 in [place] Office Visit from 01/21/2022 in [place] Office Visit from 10/22/2021 in [facility]  PHQ-2 Total Score 0 0 0 0 0  PHQ-9 Total Score 5 0 -- -- 0      [place] Office Visit from 09/09/2022 in [facility] Admission (Discharged) from 08/18/2022 in [facility] ED from 08/17/2022 in [facility] Emergency Department at [place]  No Risk No Risk No Risk        Assessment and Plan: Carl [name]. Is a 52 year old African-American male on disability, has a history of schizophrenia, multiple antipsychotic medications with chronic auditory hallucinations, failed trials of Clozaril (developed side effects) presented for medication management after his recent inpatient behavioral health admission-08/18/2022.  Patient is currently improving except for sleep problems, will benefit from following plan.  Plan Schizophrenia-improving Reduce Prolixin to 7.5 mg p.o. twice daily.  Patient had side effects to higher dosage and has been noncompliant with the higher dosage. Seroquel extended release 400 mg p.o. nightly  Insomnia-unstable Increase trazodone to 150-200 mg p.o. nightly   Collateral information obtained from mother-Pamela as noted above.     Collaboration of Care: Collaboration of Care: Other I have reviewed notes per Dr. Fredricka Bonine 08/17/2022 - 08/18/2022-patient was discharged on Prolixin 7.5 mg p.o. 3 times daily, Seroquel 400 mg, trazodone 100 mg p.o. nightly as needed.  Benztropine was discontinued.  Patient/Guardian was advised Release of Information must be obtained prior to any record release in order to collaborate their care with an outside provider. Patient/Guardian was advised if they have not already done so to contact the registration department to sign all necessary forms in order for [place] to release information regarding their care.   Consent: Patient/Guardian  gives verbal consent for treatment and assignment of benefits for services provided during this visit. Patient/Guardian expressed understanding and agreed to proceed.   Follow-up in clinic in 4 weeks or sooner if needed.  This note was generated in part or whole with voice recognition software. Voice recognition is usually quite accurate but there are transcription errors that can and very often do occur. I apologize for any typographical errors that were not detected and corrected.    Ursula Alert, MD 09/10/2022, 12:34 PM

## 2022-09-29 ENCOUNTER — Telehealth (INDEPENDENT_AMBULATORY_CARE_PROVIDER_SITE_OTHER): Payer: 59 | Admitting: Psychiatry

## 2022-09-29 ENCOUNTER — Encounter: Payer: Self-pay | Admitting: Psychiatry

## 2022-09-29 DIAGNOSIS — F209 Schizophrenia, unspecified: Secondary | ICD-10-CM

## 2022-09-29 DIAGNOSIS — G4700 Insomnia, unspecified: Secondary | ICD-10-CM

## 2022-09-29 DIAGNOSIS — R251 Tremor, unspecified: Secondary | ICD-10-CM | POA: Diagnosis not present

## 2022-09-29 MED ORDER — TRIHEXYPHENIDYL HCL 2 MG PO TABS: 2.0000 mg | ORAL_TABLET | Freq: Two times a day (BID) | ORAL | 1 refills | Status: DC

## 2022-09-29 MED ORDER — FLUPHENAZINE HCL 2.5 MG PO TABS: 7.5000 mg | ORAL_TABLET | Freq: Three times a day (TID) | ORAL | 1 refills | Status: DC

## 2022-09-29 MED ORDER — TRAZODONE HCL 100 MG PO TABS: 150.0000 mg | ORAL_TABLET | Freq: Every evening | ORAL | 1 refills | Status: DC | PRN

## 2022-09-29 NOTE — Patient Instructions (Signed)
Trihexyphenidyl Tablets What is this medication? TRIHEXYPHENIDYL (trye hex ee FEN i dil) treats movement disorders, including those caused by Parkinson disease and some medications. It works by balancing substances in your brain that help manage body movements and coordination. This reduces symptoms, such as body stiffness and tremors. This medicine may be used for other purposes; ask your health care provider or pharmacist if you have questions. COMMON BRAND NAME(S): Artane What should I tell my care team before I take this medication? They need to know if you have any of these conditions: Glaucoma Heart disease High blood pressure Kidney disease Liver disease Prostate problems An unusual or allergic reaction to trihexyphenidyl, lactose, other medications, foods, dyes, or preservatives Pregnant or trying to get pregnant Breast-feeding How should I use this medication? Take this medication by mouth with a full glass of water. Take it as directed on the prescription label. Do not suddenly stop taking your medication because you may develop a severe reaction. Keep taking it unless your care team tells you to stop. Talk to your care team about the use of this medication in children. Special care may be needed. People over 101 years of age may have a stronger reaction and need a smaller dose. Overdosage: If you think you have taken too much of this medicine contact a poison control center or emergency room at once. NOTE: This medicine is only for you. Do not share this medicine with others. What if I miss a dose? If you miss a dose, take it as soon as you can. If it is almost time for your next dose, take only that dose. Do not take double or extra doses. What may interact with this medication? Benztropine Certain medications for bladder problems, such as oxybutynin, tolterodine Certain medications for breathing problems, such as ipratropium and tiotropium Certain medications for certain  stomach or intestinal problems, such as propantheline, homatropine methylbromide, glycopyrrolate, atropine, belladonna, and dicyclomine Certain medications for travel sickness, such as scopolamine Levodopa This list may not describe all possible interactions. Give your health care provider a list of all the medicines, herbs, non-prescription drugs, or dietary supplements you use. Also tell them if you smoke, drink alcohol, or use illegal drugs. Some items may interact with your medicine. What should I watch for while using this medication? Visit your care team for regular checks on your progress. Tell your care team if your symptoms do not start to get better or if they get worse. Your mouth may get dry. Chewing sugarless gum or sucking hard candy, and drinking plenty of water may help. Contact your care team if the problem does not go away or is severe. This medication may cause dry eyes and blurred vision. If you wear contact lenses, you may feel some discomfort. Lubricating eye drops may help. See your care team if the problem does not go away or is severe. This medication may affect your coordination, reaction time, or judgement. Do not drive or operate machinery until you know how this medication affects you. Sit up or stand slowly to reduce the risk of dizzy or fainting spells. Drinking alcohol with this medication can increase the risk of these side effects. What side effects may I notice from receiving this medication? Side effects that you should report to your care team as soon as possible: Allergic reactions--skin rash, itching, hives, swelling of the face, lips, tongue, or throat Fever that does not go away, decreased sweating Sudden eye pain or change in vision such as blurry  vision, seeing halos around lights, vision loss Side effects that usually do not require medical attention (report to your care team if they continue or are bothersome): Anxiety,  nervousness Confusion Dizziness Dry mouth Nausea Vomiting This list may not describe all possible side effects. Call your doctor for medical advice about side effects. You may report side effects to FDA at 1-800-FDA-1088. Where should I keep my medication? Keep out of the reach of children and pets. Store between 15 and 30 degrees C (59 and 86 degrees F). Get rid of any unused medication after the expiration date. To get rid of medications that are no longer needed or have expired: Take the medication to a take-back program. Check with your pharmacy or law enforcement to find a location. If you cannot return the medication, check the label or package insert to see if the medication should be thrown out in the garbage or flushed down the toilet. If you are not sure, ask your care team. If it is safe to put it in the trash, empty the medication out of the container. Mix the medication with cat litter, dirt, coffee grounds, or other unwanted substance. Seal the mixture in a bag or container. Put it in the trash. NOTE: This sheet is a summary. It may not cover all possible information. If you have questions about this medicine, talk to your doctor, pharmacist, or health care provider.  2023 Elsevier/Gold Standard (2021-09-11 00:00:00)

## 2022-09-29 NOTE — Progress Notes (Unsigned)
Virtual Visit via Video Note  I connected with Carl [name]. on 09/29/22 at  2:00 PM EDT by a video enabled telemedicine application and verified that I am speaking with the correct person using two identifiers.  Location Provider Location : [facility] Patient Location : Home  Participants: Patient ,Mother, Father, Provider   I discussed the limitations of evaluation and management by telemedicine and the availability of in person appointments. The patient expressed understanding and agreed to proceed.     I discussed the assessment and treatment plan with the patient. The patient was provided an opportunity to ask questions and all were answered. The patient agreed with the plan and demonstrated an understanding of the instructions.   The patient was advised to call back or seek an in-person evaluation if the symptoms worsen or if the condition fails to improve as anticipated.   [place] MD OP Progress Note  09/30/2022 8:10 AM Carl [name].  MRN:  [id number]  Chief Complaint:  Chief Complaint  Patient presents with   Follow-up   Anxiety   Hallucinations   Insomnia   Medication Refill   Tremors   HPI: Carl [name]. is a 52 year old African-American male, single, lives in [place], has a history of schizophrenia, on multiple psychotropic medications, insomnia, was evaluated by telemedicine today.  Patient being a limited historian collateral information provided by mother who was present in session-Pamela.  Patient today reports he is overall doing fairly well.  Patient denies any auditory hallucinations.  Reports he does have sleep problems.  Mother was able to elaborate further and reported that patient takes late evening naps.  Hence he cannot sleep at night.  Patient currently lacks sleep hygiene.  Patient had recent worsening of auditory hallucinations after he reduced the dosage of his fluphenazine to 7.5 mg twice a day.  He was discharged from the inpatient hospital at  behavioral health on 7.5 mg 3 times a day.  Hence patient was restarted back on the 7.5 mg 3 times a day.  Ever since he started doing that his voices have improved.  He however has tremors of upper extremities as well as possible postural tremors.  Likely antipsychotic induced.  Patient was taken off of the Cogentin at his previous admission.  Discussed trial of Artane.  Agreeable.  Patient's mother also worried about his low blood pressure.  There have been keeping a log of the blood pressure and he has been having a lot of low readings.  Patient is currently on amlodipine 5 mg.  Patient does have upcoming primary care provider appointment scheduled tomorrow.  Agreeable to start lowering the amlodipine dosage and discussed with primary care.  Patient denies any suicidality or homicidality.  Did not appear to be preoccupied with any delusions or paranoia.  Patient denies any other concerns today.   Visit Diagnosis:    ICD-10-CM   1. Schizophrenia, chronic condition with acute exacerbation (HCC)  F20.9 fluPHENAZine (PROLIXIN) 2.5 MG tablet    traZODone (DESYREL) 100 MG tablet   improving    2. Insomnia, unspecified type  G47.00 traZODone (DESYREL) 100 MG tablet    3. Tremor  R25.1 trihexyphenidyl (ARTANE) 2 MG tablet   likely due to psychotropics      Past Psychiatric History: Reviewed past psychiatric history from progress note on 09/09/2021.  Reviewed past psychiatric history from progress note on 08/21/2021.  Recent inpatient behavioral health admissions at [facility], [place]-08/17/2022 - 08/18/2022.  Past Medical History:  Past Medical History:  Diagnosis Date   Abdominal pain    Anxiety    Constipation    Decreased visual acuity    Nevus    Polyuria    Recurrent boils    scalp behind ear   Schizophrenia (HCC)    Seizures ([facility]) 08/15/1990   due to haldol   Soft tissue mass    left scalp posterior    Past Surgical History:  Procedure Laterality Date    COLONOSCOPY WITH PROPOFOL N/A 02/14/2020   Procedure: COLONOSCOPY WITH PROPOFOL;  Surgeon: Virgel Manifold, MD;  Location: [facility] ENDOSCOPY;  Service: Endoscopy;  Laterality: N/A;   COLONOSCOPY WITH PROPOFOL N/A 02/28/2020   Procedure: COLONOSCOPY WITH PROPOFOL;  Surgeon: Virgel Manifold, MD;  Location: [facility] ENDOSCOPY;  Service: Endoscopy;  Laterality: N/A;   COLONOSCOPY WITH PROPOFOL N/A 03/18/2021   Procedure: COLONOSCOPY WITH PROPOFOL;  Surgeon: Virgel Manifold, MD;  Location: [facility] ENDOSCOPY;  Service: Endoscopy;  Laterality: N/A;   LAPAROSCOPIC RIGHT COLECTOMY Right 03/11/2020   Procedure: LAPAROSCOPIC RIGHT COLECTOMY, possible open;  Surgeon: Olean Ree, MD;  Location: [facility] ORS;  Service: General;  Laterality: Right;   MASS EXCISION Left 02/11/2015   Procedure: EXCISION MASS, left post neck;  Surgeon: Florene Glen, MD;  Location: [facility];  Service: General;  Laterality: Left;    Family Psychiatric History: Reviewed family psychiatric history from progress note on 08/21/2021.  Family History:  Family History  Problem Relation Age of Onset   Hyperlipidemia Mother    Hypertension Mother    Fibromyalgia Mother    Hypertension Father    Diabetes Father    Hyperlipidemia Father    Mental illness Neg Hx     Social History: Reviewed social history from progress note on 08/21/2021. Social History   Socioeconomic History   Marital status: Single    Spouse name: Not on file   Number of children: Not on file   Years of education: Not on file   Highest education level: 12th grade  Occupational History   Occupation: on disability  Tobacco Use   Smoking status: Never   Smokeless tobacco: Never  Vaping Use   Vaping Use: Never used  Substance and Sexual Activity   Alcohol use: No    Alcohol/week: 0.0 standard drinks of alcohol   Drug use: No   Sexual activity: Not Currently  Other Topics Concern   Not on file  Social History Narrative   Not on file    Social Determinants of Health   Financial Resource Strain: Not on file  Food Insecurity: No Food Insecurity (08/18/2022)   Hunger Vital Sign    Worried About Running Out of Food in the Last Year: Never true    Ran Out of Food in the Last Year: Never true  Transportation Needs: No Transportation Needs (08/18/2022)   PRAPARE - Hydrologist (Medical): No    Lack of Transportation (Non-Medical): No  Physical Activity: Not on file  Stress: Not on file  Social Connections: Not on file    Allergies:  Allergies  Allergen Reactions   Haldol [Haloperidol] Other (See Comments)    Causes seizures    Metabolic Disorder Labs: Lab Results  Component Value Date   HGBA1C 5.1 08/22/2022   MPG 99.67 08/22/2022   MPG 111 08/24/2021   Lab Results  Component Value Date   PROLACTIN 41.2 (H) 08/24/2021   Lab Results  Component Value Date   CHOL 176 08/22/2022   TRIG 129  08/22/2022   HDL 43 08/22/2022   CHOLHDL 4.1 08/22/2022   VLDL 26 08/22/2022   LDLCALC 107 (H) 08/22/2022   LDLCALC 149 (H) 08/24/2021   Lab Results  Component Value Date   TSH 2.016 08/22/2022   TSH 1.047 08/24/2021    Therapeutic Level Labs: No results found for: "LITHIUM" Lab Results  Component Value Date   VALPROATE 97 04/28/2019   VALPROATE 99 09/09/2017   No results found for: "CBMZ"  Current Medications: Current Outpatient Medications  Medication Sig Dispense Refill   amLODipine (NORVASC) 5 MG tablet Take 1 tablet (5 mg total) by mouth daily. 30 tablet 0   cetirizine (ZYRTEC) 10 MG tablet Take 10 mg by mouth daily as needed for allergies.     famotidine (PEPCID) 20 MG tablet Take 20 mg by mouth 2 (two) times daily.     fluticasone (FLONASE) 50 MCG/ACT nasal spray Place 2 sprays into the nose daily as needed for allergies.      QUEtiapine (SEROQUEL XR) 400 MG 24 hr tablet Take 1 tablet (400 mg total) by mouth at bedtime. 30 tablet 1   trihexyphenidyl (ARTANE) 2 MG tablet  Take 1 tablet (2 mg total) by mouth 2 (two) times daily with a meal. For tremors 60 tablet 1   docusate sodium (COLACE) 100 MG capsule Take 1 capsule (100 mg total) by mouth daily. (Patient not taking: Reported on 09/29/2022) 30 capsule 1   fluPHENAZine (PROLIXIN) 2.5 MG tablet Take 3 tablets (7.5 mg total) by mouth 3 (three) times daily. 270 tablet 1   traZODone (DESYREL) 100 MG tablet Take 1.5-2 tablets (150-200 mg total) by mouth at bedtime as needed for sleep. Use only as needed for sleep 60 tablet 1   No current facility-administered medications for this visit.     Musculoskeletal: Strength & Muscle Tone:  UTA Gait & Station: normal Patient leans: N/A  Psychiatric Specialty Exam: Review of Systems  Unable to perform ROS: Psychiatric disorder    There were no vitals taken for this visit.There is no height or weight on file to calculate BMI.  General Appearance: Casual  Eye Contact:  Fair  Speech:  Normal Rate  Volume:  Normal  Mood:  Euthymic  Affect:  Restricted  Thought Process:  Goal Directed and Descriptions of Associations: Intact  Orientation:  Full (Time, Place, and Person)  Thought Content: Logical   Suicidal Thoughts:  No  Homicidal Thoughts:  No  Memory:  Immediate;   Fair Recent;   Fair Remote;   Poor  Judgement:  Fair  Insight:  Shallow  Psychomotor Activity:  Tremor  Concentration:  Concentration: Fair and Attention Span: Fair  Recall:  [employer] of Knowledge: Fair  Language: Fair  Akathisia:  No  Handed:  Right  AIMS (if indicated): done  Assets:  Communication Skills Desire for Improvement Housing Social Support Transportation  ADL's:  Intact  Cognition: WNL  Sleep:   Interrupted  at night   Screenings: [place] Video Visit from 09/29/2022 in [facility] [place] Office Visit from 09/09/2022 in [facility] Admission (Discharged) from 08/18/2022 in [facility] Office Visit from 05/18/2022 in [facility] Office Visit from 03/02/2022 in [facility] Total Score 0 0 0 0 0      AUDIT    Flowsheet Row Admission (Discharged) from 08/18/2022 in [facility]  [facility] Admission (Discharged) from 09/05/2017 in [facility]  Alcohol Use Disorder Identification Test Final Score (AUDIT) 0 0      GAD-7    Flowsheet Row Office Visit from 09/09/2022 in [facility] Office Visit from 05/18/2022 in [facility] Office Visit from 03/02/2022 in [facility] Office Visit from 01/21/2022 in [facility] Office Visit from 08/21/2021 in [place]  Total GAD-7 Score 6 0 0 0 0      PHQ2-9    [place] Office Visit from 09/09/2022 in [facility] Office Visit from 05/18/2022 in [facility] Office Visit from 03/02/2022 in [facility] Office Visit from 01/21/2022 in [facility] Office Visit from 10/22/2021 in [facility]  PHQ-2 Total Score 0 0 0 0 0  PHQ-9 Total Score 5 0 -- -- 0      Flowsheet Row Video Visit from 09/29/2022 in [facility] Office Visit from 09/09/2022 in [facility] Admission (Discharged) from 08/18/2022 in [facility]  C-SSRS RISK CATEGORY No Risk No Risk No Risk        Assessment and Plan: Carl Randallis a 52 year old African-American male on disability, has a history of  schizophrenia, multiple antipsychotic medications with chronic auditory hallucinations, failed trials of Clozaril (developed side effects) recent inpatient behavioral health admission, currently with possible adverse side effects to medications, likely due to being on 2 antipsychotics.  Patient will benefit from the following plan.  Plan Schizophrenia-improving Continue Prolixin 7.5 mg p.o. 3 times daily.  Dosage recently increased per family , this is the dose he was discharged on from the inpatient behavioral health. Seroquel extended release 400 mg p.o. nightly  Insomnia-unstable Patient advised to work on sleep hygiene.  Advised to avoid late evening naps.  Patient advised to keep a sleep diary. Continue trazodone 150-200 mg p.o. nightly  Tremor-likely secondary to psychotropics-unstable Start Artane 2 mg twice daily. Will consider reducing the dosage of Seroquel or Prolixin in the future as needed.  However at this time patient needs this combination of medication at this dosage due to risk of decompensation if reduced leading to rehospitalization.  Collateral information obtained from mother as noted above.  Follow-up in clinic in 2 months or sooner in person.  Patient/Guardian was advised Release of Information must be obtained prior to any record release in order to collaborate their care with an outside provider. Patient/Guardian was advised if they have not already done so to contact the registration department to sign all necessary forms in order for [place] to release information regarding their care.   Consent: Patient/Guardian gives verbal consent for treatment and assignment of benefits for services provided during this visit. Patient/Guardian expressed understanding and agreed to proceed.   This note was generated in part or whole with voice recognition software. Voice recognition is usually quite accurate but there are transcription errors that can and very often do occur. I  apologize for any typographical errors that were not detected and corrected.    Ursula Alert, MD 09/30/2022, 8:10 AM

## 2022-11-17 ENCOUNTER — Ambulatory Visit (INDEPENDENT_AMBULATORY_CARE_PROVIDER_SITE_OTHER): Payer: 59 | Admitting: Psychiatry

## 2022-11-17 ENCOUNTER — Encounter: Payer: Self-pay | Admitting: Psychiatry

## 2022-11-17 VITALS — BP 115/74 | HR 98 | Temp 98.2°F | Ht 66.0 in | Wt 154.0 lb

## 2022-11-17 DIAGNOSIS — F209 Schizophrenia, unspecified: Secondary | ICD-10-CM

## 2022-11-17 DIAGNOSIS — G2111 Neuroleptic induced parkinsonism: Secondary | ICD-10-CM | POA: Diagnosis not present

## 2022-11-17 DIAGNOSIS — G4709 Other insomnia: Secondary | ICD-10-CM

## 2022-11-17 MED ORDER — TRIHEXYPHENIDYL HCL 2 MG PO TABS: 2.0000 mg | ORAL_TABLET | Freq: Two times a day (BID) | ORAL | 3 refills | Status: DC

## 2022-11-17 MED ORDER — QUETIAPINE FUMARATE ER 400 MG PO TB24: 400.0000 mg | ORAL_TABLET | Freq: Every day | ORAL | 3 refills | Status: DC

## 2022-11-17 MED ORDER — FLUPHENAZINE HCL 2.5 MG PO TABS: 7.5000 mg | ORAL_TABLET | Freq: Three times a day (TID) | ORAL | 3 refills | Status: DC

## 2022-11-17 MED ORDER — TRAZODONE HCL 100 MG PO TABS: 150.0000 mg | ORAL_TABLET | Freq: Every evening | ORAL | 3 refills | Status: DC | PRN

## 2022-11-17 NOTE — Progress Notes (Signed)
BH MD OP Progress Note  11/17/2022 2:10 PM Carl [name].  MRN:  [id number]  Chief Complaint:  Chief Complaint  Patient presents with   Follow-up   Schizophrenia   Insomnia   HPI: Carl [name] a 52 year old African-American male, single, lives in [place], has a history of schizophrenia, on multiple psychotropic medications, insomnia, tremors was evaluated in office today.  Collateral information obtained from both parents who are present in session today.  Patient is a limited historian.  Patient however was able to answer questions appropriately although in short phrases.  Patient reports overall he is currently doing well.  Denies any hallucinations.  Denies any side effects to medications.  His tremors have improved.  He is currently compliant on the Artane.  Patient denies any suicidality, homicidality or perceptual disturbances.  Parents however reports patient continues to struggle with his sleep.  He stays in bed a lot during the day, takes extended naps during the day.  He goes to bed at around midnight and stays in bed until the next day 2 PM.  He does take his morning medications as well as has breakfast or lunch.  He goes back to bed most of the time after that.  He is unable to sleep at night if he does not take the combination of Seroquel and trazodone and even then it is hard at times to fall asleep.  Denies any other concerns today.  Visit Diagnosis:    ICD-10-CM   1. Schizophrenia, chronic condition with acute exacerbation (HCC)  F20.9 QUEtiapine (SEROQUEL XR) 400 MG 24 hr tablet   Improving    2. 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  Reviewed past psychiatric history from progress  note on 08/21/2021.  Recent inpatient behavioral health admission-[facility]-08/17/2022 - 08/18/2022.  Past Medical History:  Past Medical History:  Diagnosis Date   Abdominal pain    Anxiety    Constipation    Decreased visual acuity    Nevus    Polyuria    Recurrent boils    scalp behind ear   Schizophrenia (HCC)    Seizures (HCC) 08/15/1990   due to haldol   Soft tissue mass    left scalp posterior    Past Surgical History:  Procedure Laterality Date   COLONOSCOPY WITH PROPOFOL N/A 02/14/2020   Procedure: COLONOSCOPY WITH PROPOFOL;  Surgeon: Pasty Spillers, MD;  Location: [facility];  Service: Endoscopy;  Laterality: N/A;   COLONOSCOPY WITH PROPOFOL N/A 02/28/2020   Procedure: COLONOSCOPY WITH PROPOFOL;  Surgeon: Pasty Spillers, MD;  Location: [facility];  Service: Endoscopy;  Laterality: N/A;   COLONOSCOPY WITH PROPOFOL N/A 03/18/2021   Procedure: COLONOSCOPY WITH PROPOFOL;  Surgeon: Pasty Spillers, MD;  Location: [facility];  Service: Endoscopy;  Laterality: N/A;   LAPAROSCOPIC RIGHT COLECTOMY Right 03/11/2020   Procedure: LAPAROSCOPIC RIGHT COLECTOMY, possible open;  Surgeon: Henrene Dodge, MD;  Location: [facility];  Service: General;  Laterality: Right;   MASS EXCISION Left 02/11/2015   Procedure: EXCISION MASS, left post neck;  Surgeon: Lattie Haw, MD;  Location: [facility];  Service: General;  Laterality: Left;    Family Psychiatric History: I have reviewed family psychiatric history  from progress note on 08/21/2021.  Family History:  Family History  Problem Relation Age of Onset   Hyperlipidemia Mother    Hypertension Mother    Fibromyalgia Mother    Hypertension Father    Diabetes Father    Hyperlipidemia Father    Mental illness Neg Hx     Social History: Reviewed social history from progress note on 08/21/2021. Social History   Socioeconomic History   Marital status: Single    Spouse name: Not on file   Number of  children: Not on file   Years of education: Not on file   Highest education level: 12th grade  Occupational History   Occupation: on disability  Tobacco Use   Smoking status: Never   Smokeless tobacco: Never  Vaping Use   Vaping Use: Never used  Substance and Sexual Activity   Alcohol use: No    Alcohol/week: 0.0 standard drinks of alcohol   Drug use: No   Sexual activity: Not Currently  Other Topics Concern   Not on file  Social History Narrative   Not on file   Social Determinants of Health   Financial Resource Strain: Not on file  Food Insecurity: No Food Insecurity (08/18/2022)   Hunger Vital Sign    Worried About Running Out of Food in the Last Year: Never true    Ran Out of Food in the Last Year: Never true  Transportation Needs: No Transportation Needs (08/18/2022)   PRAPARE - Administrator, Civil Service (Medical): No    Lack of Transportation (Non-Medical): No  Physical Activity: Not on file  Stress: Not on file  Social Connections: Not on file    Allergies:  Allergies  Allergen Reactions   Haldol [Haloperidol] Other (See Comments)    Causes seizures    Metabolic Disorder Labs: Lab Results  Component Value Date   HGBA1C 5.1 08/22/2022   MPG 99.67 08/22/2022   MPG 111 08/24/2021   Lab Results  Component Value Date   PROLACTIN 41.2 (H) 08/24/2021   Lab Results  Component Value Date   CHOL 176 08/22/2022   TRIG 129 08/22/2022   HDL 43 08/22/2022   CHOLHDL 4.1 08/22/2022   VLDL 26 08/22/2022   LDLCALC 107 (H) 08/22/2022   LDLCALC 149 (H) 08/24/2021   Lab Results  Component Value Date   TSH 2.016 08/22/2022   TSH 1.047 08/24/2021    Therapeutic Level Labs: No results found for: "LITHIUM" Lab Results  Component Value Date   VALPROATE 97 04/28/2019   VALPROATE 99 09/09/2017   No results found for: "CBMZ"  Current Medications: Current Outpatient Medications  Medication Sig Dispense Refill   cetirizine (ZYRTEC) 10 MG tablet  Take 10 mg by mouth daily as needed for allergies.     docusate sodium (COLACE) 100 MG capsule Take 1 capsule (100 mg total) by mouth daily. 30 capsule 1   famotidine (PEPCID) 20 MG tablet Take 20 mg by mouth 2 (two) times daily.     fluticasone (FLONASE) 50 MCG/ACT nasal spray Place 2 sprays into the nose daily as needed for allergies.      fluPHENAZine (PROLIXIN) 2.5 MG tablet Take 3 tablets (7.5 mg total) by mouth 3 (three) times daily. 270 tablet 3   QUEtiapine (SEROQUEL XR) 400 MG 24 hr tablet Take 1 tablet (400 mg total) by mouth at bedtime. 30 tablet 3   traZODone (DESYREL) 100 MG tablet Take 1.5-2 tablets (150-200 mg total) by mouth at bedtime as  needed for sleep. Use only as needed for sleep 60 tablet 3   trihexyphenidyl (ARTANE) 2 MG tablet Take 1 tablet (2 mg total) by mouth 2 (two) times daily with a meal. For tremors 60 tablet 3   No current facility-administered medications for this visit.     Musculoskeletal: Strength & Muscle Tone: within normal limits Gait & Station: normal Patient leans: N/A  Psychiatric Specialty Exam: Review of Systems  Psychiatric/Behavioral:  Positive for sleep disturbance.   All other systems reviewed and are negative.   Blood pressure 115/74, pulse 98, temperature 98.2 F (36.8 C), temperature source Temporal, height 5\' 6"  (1.676 m), weight 154 lb (69.9 kg).Body mass index is 24.86 kg/m.  General Appearance: Casual  Eye Contact:  Fair  Speech:  Clear and Coherent  Volume:  Normal  Mood:  Euthymic  Affect:  Flat  Thought Process:  Goal Directed and Descriptions of Associations: Intact  Orientation:  Full (Time, Place, and Person)  Thought Content: Logical   Suicidal Thoughts:  No  Homicidal Thoughts:  No  Memory:  Immediate;   Fair Recent;   Fair Remote;   Poor  Judgement:  Fair  Insight:  Fair  Psychomotor Activity:  Normal  Concentration:  Concentration: Fair and Attention Span: Fair  Recall:  [employer] of Knowledge: Fair   Language: Fair  Akathisia:  No  Handed:  Right  AIMS (if indicated): done  Assets:  Desire for Improvement Housing Social Support  ADL's:  Intact  Cognition: WNL  Sleep:   Excessive   Screenings: AIMS    Flowsheet Row Office Visit from 11/17/2022 in [facility] Video Visit from 09/29/2022 in [facility] Office Visit from 09/09/2022 in [facility] (Dis[facility] Visit from 05/18/2022 in [facility]  AIMS Total Score 0 0 0 0 0      AUDIT    Flowsheet Row [facility]) [facility] Admission (Discharged) from 09/05/2017 in [facility]  Alcohol Use Disorder Identification Test Final Score (AUDIT) 0 0      GAD-7    Flowsheet Row Office Visit from 11/17/2022 in [facility] Office Visit from 09/09/2022 in [facility] Office Visit from 05/18/2022 in [facility] Office Visit from 03/02/2022 in [facility] Office Visit from 01/21/2022 in [facility]  Total GAD-7 Score 0 6 0 0 0      PHQ2-9    Flowsheet Row Office Visit from 11/17/2022 in [facility] Office Visit from 09/09/2022 in [facility] Office Visit from 05/18/2022 in [facility] Office Visit from 03/02/2022 in [facility] Office Visit from 01/21/2022 in [facility]  PHQ-2 Total Score 0 0 0 0 0  PHQ-9 Total Score -- 5 0 -- --       Flowsheet Row Office Visit from 11/17/2022 in [facility] Video Visit from 09/29/2022 in [facility] Office Visit from 09/09/2022 in [facility]  C-SSRS RISK CATEGORY No Risk No Risk No Risk        Assessment and Plan:  Carl [name] a 52 year old African-American male, on disability, has a history of schizophrenia, failed trials of Clozaril (developed side effects) recent inpatient behavioral health admission currently improving with regards to his psychosis although continues to struggle with excessive sleepiness likely due to lack of sleep hygiene, will benefit from the following plan.  Plan Schizophrenia-improving Prolixin 7.5 mg p.o. 3 times daily. Seroquel extended release 400 mg p.o. nightly  Insomnia-unstable Patient lacks sleep hygiene.  Discussed sleep hygiene techniques at length with patient as well as family.  Patient does set a bedtime alarm and a wake-up time alarm.  Patient to stay out of bed and limit the amount of naps during the day.  If he continues to have trouble will consider readjusting the dosages of medications will also consider referral for sleep study.  Neuroleptic induced parkinsonism-improving Continue Artane 2 mg p.o. twice daily  Collateral information obtained from both parents as noted above.  Follow-up in clinic in 3 to 4 months or sooner if needed.   Collaboration of Care: Collaboration of Care: Other collateral information obtained from both parents.  Patient/Guardian was advised Release of Information must be obtained prior to any record release in order to collaborate their care with an outside provider. Patient/Guardian was advised if they have not already done so to contact the registration department to sign all necessary forms in order for [place] to release information regarding their care.   Consent: Patient/Guardian  gives verbal consent for treatment and assignment of benefits for services provided during this visit. Patient/Guardian expressed understanding and agreed to proceed.   This note was generated in part or whole with voice recognition software. Voice recognition is usually quite accurate but there are transcription errors that can and very often do occur. I apologize for any typographical errors that were not detected and corrected.    Jomarie Longs, MD 11/18/2022, 9:16 AM

## 2023-03-22 ENCOUNTER — Other Ambulatory Visit: Payer: Self-pay | Admitting: Psychiatry

## 2023-03-22 DIAGNOSIS — F209 Schizophrenia, unspecified: Secondary | ICD-10-CM

## 2023-03-28 ENCOUNTER — Telehealth: Payer: 59 | Admitting: Psychiatry

## 2023-03-31 ENCOUNTER — Telehealth: Payer: 59 | Admitting: Psychiatry

## 2023-04-11 ENCOUNTER — Other Ambulatory Visit: Payer: Self-pay | Admitting: Psychiatry

## 2023-04-11 DIAGNOSIS — G2111 Neuroleptic induced parkinsonism: Secondary | ICD-10-CM

## 2023-04-14 ENCOUNTER — Inpatient Hospital Stay: Payer: 59 | Admitting: Oncology

## 2023-04-14 ENCOUNTER — Inpatient Hospital Stay: Payer: 59

## 2023-04-14 NOTE — Assessment & Plan Note (Deleted)
#  Stage I colon cancer status post right hemicolectomy -03/11/2020 Labs are reviewed and discussed with patient. CEA is normal and stable.  Clinically he is doing well.  Recommend follow up in 1 year

## 2023-04-14 NOTE — Assessment & Plan Note (Deleted)
Patient was previously referred to genetic counseling multiple times. He canceled his appointment.

## 2023-04-21 ENCOUNTER — Telehealth: Payer: 59 | Admitting: Psychiatry

## 2023-04-25 ENCOUNTER — Other Ambulatory Visit: Payer: Self-pay | Admitting: Psychiatry

## 2023-04-25 DIAGNOSIS — G4709 Other insomnia: Secondary | ICD-10-CM

## 2023-04-26 ENCOUNTER — Telehealth: Payer: Self-pay | Admitting: Psychiatry

## 2023-04-26 DIAGNOSIS — G4709 Other insomnia: Secondary | ICD-10-CM

## 2023-04-26 MED ORDER — TRAZODONE HCL 100 MG PO TABS: 150.0000 mg | ORAL_TABLET | Freq: Every evening | ORAL | 3 refills | Status: DC | PRN

## 2023-04-26 NOTE — Telephone Encounter (Signed)
I have sent trazodone to pharmacy. 

## 2023-04-26 NOTE — Telephone Encounter (Signed)
called pharmacy the only medication patient needs is the trazodone. no more refills left.

## 2023-04-26 NOTE — Telephone Encounter (Signed)
Patient father in with patient states pharmacy has tried contacting office for refill on medication. He has been out of  fluphenazine 2.5 mg and quetapine 400 mg. Please review and send refills to [facility]. Advised patient and father to call prior to running out of medication.

## 2023-04-26 NOTE — Telephone Encounter (Signed)
pt mother called left message that Carl [name] needed refills on his all medications. after reviewing chart it looks like he should have refills on all his medication except for the trazodone. I will call the pharmacy and confirm this information.

## 2023-04-27 NOTE — Telephone Encounter (Signed)
Pt mother notified.

## 2023-04-28 ENCOUNTER — Telehealth: Payer: 59 | Admitting: Psychiatry

## 2023-05-05 ENCOUNTER — Encounter: Payer: Self-pay | Admitting: Psychiatry

## 2023-05-05 ENCOUNTER — Telehealth: Payer: 59 | Admitting: Psychiatry

## 2023-05-05 DIAGNOSIS — G2111 Neuroleptic induced parkinsonism: Secondary | ICD-10-CM

## 2023-05-05 DIAGNOSIS — Z9189 Other specified personal risk factors, not elsewhere classified: Secondary | ICD-10-CM

## 2023-05-05 DIAGNOSIS — G4709 Other insomnia: Secondary | ICD-10-CM | POA: Diagnosis not present

## 2023-05-05 DIAGNOSIS — F209 Schizophrenia, unspecified: Secondary | ICD-10-CM | POA: Diagnosis not present

## 2023-05-05 NOTE — Progress Notes (Signed)
Virtual Visit via Video Note  I connected with Carl [name]. on 05/05/23 at  3:00 PM EDT by a video enabled telemedicine application and verified that I am speaking with the correct person using two identifiers.  Location Provider Location : [facility] Patient Location : Home  Participants: Patient ,Mother, Provider   I discussed the limitations of evaluation and management by telemedicine and the availability of in person appointments. The patient expressed understanding and agreed to proceed.   I discussed the assessment and treatment plan with the patient. The patient was provided an opportunity to ask questions and all were answered. The patient agreed with the plan and demonstrated an understanding of the instructions.   The patient was advised to call back or seek an in-person evaluation if the symptoms worsen or if the condition fails to improve as anticipated.    BH MD OP Progress Note  05/06/2023 11:29 AM Carl [name].  MRN:  [id number]  Chief Complaint:  Chief Complaint  Patient presents with   Follow-up   Hallucinations   Sleeping Problem   Medication Refill   HPI: Carl Randallis a 52 year old African-American male, single, lives in [place], has a history of schizophrenia, on multiple psychotropic medications, insomnia, tremors was evaluated by telemedicine today.  Patient being a limited historian collateral information obtained from mother, Carl [name].  According to mother patient is currently not doing well.  He is often distracted, has limited attention and focus and has been hallucinating.  Patient appears to be responding to auditory hallucinations.  However when patient was asked directly about hallucinations patient reported,' he does not know', unable to elaborate further. Mother also reports patient has been having excessive sleepiness during the day.  He does not have a good sleep hygiene yet.  According to mother she sets up  pillbox for him and she  believes he is taking his medications however she does not know for sure.  Patient today appeared to be alert and oriented to the day, date.  Patient reports it is Halloween.  Otherwise patient unable to give any information about his medications or other details about his medical issues.  Patient currently denies any suicidality or homicidality.  Denies any other concerns today.  Visit Diagnosis:    ICD-10-CM   1. Schizophrenia, chronic condition with acute exacerbation (HCC)  F20.9     2. Other insomnia  G47.09    Likely lack of sleep hygiene, being on multiple psychotropics    3. 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 At risk for prolonged QT interval syndrome  Z91.89 EKG 12-Lead      Past Psychiatric History: I have reviewed past psychiatric history from progress note on 09/09/2021.  I have reviewed past psychiatric history from my progress note on 08/21/2021.  Recent inpatient behavioral health admission-[facility] at [facility]-08/17/2022 - 08/18/2022.  Past Medical History:  Past Medical History:  Diagnosis Date   Abdominal pain    Anxiety    Constipation    Decreased visual acuity    Nevus    Polyuria    Recurrent boils    scalp behind ear   Schizophrenia (HCC)    Seizures (HCC) 08/15/1990   due to haldol   Soft tissue mass    left scalp posterior    Past Surgical History:  Procedure Laterality Date   COLONOSCOPY WITH PROPOFOL N/A 02/14/2020   Procedure: COLONOSCOPY WITH PROPOFOL;  Surgeon: Pasty Spillers, MD;  Location: [facility] END[facility]COPY;  Service:  Endoscopy;  Laterality: N/A;   COLONOSCOPY WITH PROPOFOL N/A 02/28/2020   Procedure: COLONOSCOPY WITH PROPOFOL;  Surgeon: Pasty Spillers, MD;  Location: [facility] ENDOSCOPY;  Service: Endoscopy;  Laterality: N/A;   COLONOSCOPY WITH PROPOFOL N/A 03/18/2021   Procedure: COLONOSCOPY WITH PROPOFOL;  Surgeon: Pasty Spillers, MD;  Location: [facility] ENDOSCOPY;  Service: Endoscopy;  Laterality: N/A;    LAPAROSCOPIC RIGHT COLECTOMY Right 03/11/2020   Procedure: LAPAROSCOPIC RIGHT COLECTOMY, possible open;  Surgeon: Henrene Dodge, MD;  Location: [facility] ORS;  Service: General;  Laterality: Right;   MASS EXCISION Left 02/11/2015   Procedure: EXCISION MASS, left post neck;  Surgeon: Lattie Haw, MD;  Location: [facility];  Service: General;  Laterality: Left;    Family Psychiatric History: I have reviewed family psychiatric history from progress note on 09/09/2021.  Family History:  Family History  Problem Relation Age of Onset   Hyperlipidemia Mother    Hypertension Mother    Fibromyalgia Mother    Hypertension Father    Diabetes Father    Hyperlipidemia Father    Mental illness Neg Hx     Social History: I have reviewed social history from progress note on 09/09/2021. Social History   Socioeconomic History   Marital status: Single    Spouse name: Not on file   Number of children: Not on file   Years of education: Not on file   Highest education level: 12th grade  Occupational History   Occupation: on disability  Tobacco Use   Smoking status: Never   Smokeless tobacco: Never  Vaping Use   Vaping status: Never Used  Substance and Sexual Activity   Alcohol use: No    Alcohol/week: 0.0 standard drinks of alcohol   Drug use: No   Sexual activity: Not Currently  Other Topics Concern   Not on file  Social History Narrative   Not on file   Social Determinants of Health   Financial Resource Strain: Not on file  Food Insecurity: No Food Insecurity (08/18/2022)   Hunger Vital Sign    Worried About Running Out of Food in the Last Year: Never true    Ran Out of Food in the Last Year: Never true  Transportation Needs: No Transportation Needs (08/18/2022)   PRAPARE - Administrator, Civil Service (Medical): No    Lack of Transportation (Non-Medical): No  Physical Activity: Not on file  Stress: Not on file  Social Connections: Not on file    Allergies:   Allergies  Allergen Reactions   Haldol [Haloperidol] Other (See Comments)    Causes seizures    Metabolic Disorder Labs: Lab Results  Component Value Date   HGBA1C 5.1 08/22/2022   MPG 99.67 08/22/2022   MPG 111 08/24/2021   Lab Results  Component Value Date   PROLACTIN 41.2 (H) 08/24/2021   Lab Results  Component Value Date   CHOL 176 08/22/2022   TRIG 129 08/22/2022   HDL 43 08/22/2022   CHOLHDL 4.1 08/22/2022   VLDL 26 08/22/2022   LDLCALC 107 (H) 08/22/2022   LDLCALC 149 (H) 08/24/2021   Lab Results  Component Value Date   TSH 2.016 08/22/2022   TSH 1.047 08/24/2021    Therapeutic Level Labs: No results found for: "LITHIUM" Lab Results  Component Value Date   VALPROATE 97 04/28/2019   VALPROATE 99 09/09/2017   No results found for: "CBMZ"  Current Medications: Current Outpatient Medications  Medication Sig Dispense Refill   cetirizine (ZYRTEC) 10  MG tablet Take 10 mg by mouth daily as needed for allergies.     docusate sodium (COLACE) 100 MG capsule Take 1 capsule (100 mg total) by mouth daily. 30 capsule 1   famotidine (PEPCID) 20 MG tablet Take 20 mg by mouth 2 (two) times daily.     fluPHENAZine (PROLIXIN) 2.5 MG tablet TAKE 3 TABLETS (7.5 MG TOTAL) BY MOUTH 3TIMES DAILY 270 tablet 3   fluticasone (FLONASE) 50 MCG/ACT nasal spray Place 2 sprays into the nose daily as needed for allergies.      QUEtiapine (SEROQUEL XR) 400 MG 24 hr tablet TAKE 1 TABLET BY MOUTH AT BEDTIME 30 tablet 3   traZODone (DESYREL) 100 MG tablet Take 1.5-2 tablets (150-200 mg total) by mouth at bedtime as needed for sleep. Use only as needed for sleep 60 tablet 3   trihexyphenidyl (ARTANE) 2 MG tablet TAKE 1 TABLET BY MOUTH 2 TIMES DAILY WITH A MEAL FOR TREMORS 60 tablet 5   No current facility-administered medications for this visit.     Musculoskeletal: Strength & Muscle Tone:  UTA Gait & Station:  Seated Patient leans: N/A  Psychiatric Specialty Exam: Review of Systems   Unable to perform ROS: Psychiatric disorder    There were no vitals taken for this visit.There is no height or weight on file to calculate BMI.  General Appearance: Casual  Eye Contact:  Fair  Speech:  Clear and Coherent  Volume:  Normal  Mood:   unable to elaborate ,patient not very verbal  Affect:  Blunt  Thought Process:  Linear and Descriptions of Associations: Circumstantial  Orientation:  Other:  self ,situation  Thought Content: Hallucinations: Auditory   Suicidal Thoughts:  No  Homicidal Thoughts:  No  Memory:  Immediate;   Poor  Judgement:  Poor  Insight:  Shallow  Psychomotor Activity:  Normal  Concentration:  Concentration: Poor and Attention Span: Poor  Recall:  Poor  Fund of Knowledge: Fair  Language: Fair  Akathisia:  No  Handed:  Right  AIMS (if indicated): not done  Assets:  Desire for Improvement Housing Social Support  ADL's:  Intact  Cognition: WNL  Sleep:   excessive   Screenings: AIMS    Flowsheet Row Office Visit from 11/17/2022 in [facility] Video Visit from 09/29/2022 in [facility] Office Visit from 09/09/2022 in [facility] ([facility] Office Visit from 05/18/2022 in [facility]  AIMS Total Score 0 0 0 0 0      AUDIT    Flowsheet Row [facility]) [facility] Admission (Discharged) from 09/05/2017 in [facility]  Alcohol Use Disorder Identification Test Final Score (AUDIT) 0 0      GAD-7    Flowsheet Row Office Visit from 11/17/2022 in [facility] Office Visit from 09/09/2022 in [facility] Office Visit from 05/18/2022 in [facility] Office Visit from 03/02/2022 in [facility] Office Visit from 01/21/2022 in [facility]  Total GAD-7 Score 0 6 0 0 0      PHQ2-9    Flowsheet Row Office Visit from 11/17/2022 in [facility] Office Visit from 09/09/2022 in [facility] Office Visit  from 05/18/2022 in [facility] Office Visit from 03/02/2022 in [facility] Office Visit from 01/21/2022 in [facility]  PHQ-2 Total Score 0 0 0 0 0  PHQ-9 Total Score -- 5 0 -- --      Flowsheet Row Video Visit from 05/05/2023 in [facility] Office Visit from 11/17/2022 in [facility] Video Visit from 09/29/2022 in [facility]  C-SSRS RISK CATEGORY No Risk No Risk No Risk        Assessment and Plan: Carl Randallis a 52 year old African-American male, on disability, has a history of schizophrenia, failed trials of Clozaril(developed side effects) , currently presents with worsening hallucinations, excessive sleepiness likely multifactorial, will benefit from the following plan.  Plan Schizophrenia-unstable Continue Prolixin 7.5 mg p.o. 3 times daily Continue Seroquel extended release 400 mg p.o. nightly.  We will consider increasing the dosage of Seroquel however will need EKG prior to that.  Insomnia-unstable Patient currently struggling with lack of sleep hygiene, excessive sleepiness during the day.  Patient continues to not follow recommendations to work on sleep hygiene.   Neuroleptic induced parkinsonism-improving Continue Artane 2 mg p.o. twice daily  At risk for prolonged QT syndrome-we will order EKG.  Patient  to call [phone].  Collateral information obtained from mother as noted above.  Follow-up in clinic in 2 to 3 weeks or sooner in office.    Consent: Patient/Guardian gives verbal consent for treatment and assignment of benefits for services provided during this visit. Patient/Guardian expressed understanding and agreed to proceed.   This note was generated in part or whole with voice recognition software. Voice recognition is usually quite accurate but there are transcription errors that can and very often do occur. I apologize for any typographical errors that were not detected and corrected.    Jomarie Longs, MD 05/06/2023, 11:29 AM

## 2023-05-05 NOTE — Patient Instructions (Signed)
Please call for [phone] 

## 2023-05-06 ENCOUNTER — Ambulatory Visit
Admission: RE | Admit: 2023-05-06 | Discharge: 2023-05-06 | Disposition: A | Discharge status: Home / Self Care | Payer: 59 | Source: Ambulatory Visit | Attending: Psychiatry | Admitting: Psychiatry

## 2023-05-06 ENCOUNTER — Other Ambulatory Visit: Payer: Self-pay

## 2023-05-06 DIAGNOSIS — C189 Malignant neoplasm of colon, unspecified: Secondary | ICD-10-CM

## 2023-05-06 DIAGNOSIS — R9431 Abnormal electrocardiogram [ECG] [EKG]: Secondary | ICD-10-CM | POA: Diagnosis not present

## 2023-05-06 DIAGNOSIS — Z9189 Other specified personal risk factors, not elsewhere classified: Secondary | ICD-10-CM | POA: Diagnosis present

## 2023-05-09 ENCOUNTER — Encounter: Payer: Self-pay | Admitting: Oncology

## 2023-05-09 ENCOUNTER — Inpatient Hospital Stay: Payer: 59 | Attending: Oncology

## 2023-05-09 ENCOUNTER — Inpatient Hospital Stay (HOSPITAL_BASED_OUTPATIENT_CLINIC_OR_DEPARTMENT_OTHER): Payer: 59 | Admitting: Oncology

## 2023-05-09 VITALS — BP 125/94 | HR 89 | Temp 96.9°F | Resp 18 | Wt 157.0 lb

## 2023-05-09 DIAGNOSIS — Z8042 Family history of malignant neoplasm of prostate: Secondary | ICD-10-CM | POA: Insufficient documentation

## 2023-05-09 DIAGNOSIS — Z809 Family history of malignant neoplasm, unspecified: Secondary | ICD-10-CM | POA: Diagnosis not present

## 2023-05-09 DIAGNOSIS — Z9049 Acquired absence of other specified parts of digestive tract: Secondary | ICD-10-CM | POA: Diagnosis not present

## 2023-05-09 DIAGNOSIS — Z08 Encounter for follow-up examination after completed treatment for malignant neoplasm: Secondary | ICD-10-CM | POA: Insufficient documentation

## 2023-05-09 DIAGNOSIS — R718 Other abnormality of red blood cells: Secondary | ICD-10-CM | POA: Diagnosis not present

## 2023-05-09 DIAGNOSIS — Z85038 Personal history of other malignant neoplasm of large intestine: Secondary | ICD-10-CM | POA: Diagnosis present

## 2023-05-09 DIAGNOSIS — Z79899 Other long term (current) drug therapy: Secondary | ICD-10-CM | POA: Diagnosis not present

## 2023-05-09 DIAGNOSIS — C189 Malignant neoplasm of colon, unspecified: Secondary | ICD-10-CM

## 2023-05-09 DIAGNOSIS — Z803 Family history of malignant neoplasm of breast: Secondary | ICD-10-CM | POA: Insufficient documentation

## 2023-05-09 LAB — CMP (CANCER CENTER ONLY)
ALT: 12 U/L (ref 0–44)
AST: 14 U/L — ABNORMAL LOW (ref 15–41)
Albumin: 3.8 g/dL (ref 3.5–5.0)
Alkaline Phosphatase: 52 U/L (ref 38–126)
Anion gap: 6 (ref 5–15)
BUN: 12 mg/dL (ref 6–20)
CO2: 28 mmol/L (ref 22–32)
Calcium: 8.7 mg/dL — ABNORMAL LOW (ref 8.9–10.3)
Chloride: 102 mmol/L (ref 98–111)
Creatinine: 1.31 mg/dL — ABNORMAL HIGH (ref 0.61–1.24)
GFR, Estimated: >60 mL/min (ref >60)
Glucose, Bld: 103 mg/dL — ABNORMAL HIGH (ref 70–99)
Potassium: 3.7 mmol/L (ref 3.5–5.1)
Sodium: 136 mmol/L (ref 135–145)
Total Bilirubin: 0.5 mg/dL (ref <1.2)
Total Protein: 6.4 g/dL — ABNORMAL LOW (ref 6.5–8.1)

## 2023-05-09 LAB — CBC WITH DIFFERENTIAL (CANCER CENTER ONLY)
Abs Immature Granulocytes: 0.01 10*3/uL (ref 0.00–0.07)
Basophils Absolute: 0 10*3/uL (ref 0.0–0.1)
Basophils Relative: 1 %
Eosinophils Absolute: 0.1 10*3/uL (ref 0.0–0.5)
Eosinophils Relative: 2 %
HCT: 40 % (ref 39.0–52.0)
Hemoglobin: 13.8 g/dL (ref 13.0–17.0)
Immature Granulocytes: 0 %
Lymphocytes Relative: 39 %
Lymphs Abs: 1.7 10*3/uL (ref 0.7–4.0)
MCH: 27.7 pg (ref 26.0–34.0)
MCHC: 34.5 g/dL (ref 30.0–36.0)
MCV: 80.3 fL (ref 80.0–100.0)
Monocytes Absolute: 0.4 10*3/uL (ref 0.1–1.0)
Monocytes Relative: 9 %
Neutro Abs: 2.2 10*3/uL (ref 1.7–7.7)
Neutrophils Relative %: 49 %
Platelet Count: 244 10*3/uL (ref 150–400)
RBC: 4.98 MIL/uL (ref 4.22–5.81)
RDW: 13 % (ref 11.5–15.5)
WBC Count: 4.4 10*3/uL (ref 4.0–10.5)
nRBC: 0 % (ref 0.0–0.2)

## 2023-05-09 LAB — IRON AND TIBC
Iron: 93 ug/dL (ref 45–182)
Saturation Ratios: 34 % (ref 17.9–39.5)
TIBC: 274 ug/dL (ref 250–450)
UIBC: 181 ug/dL

## 2023-05-09 LAB — FERRITIN: Ferritin: 92 ng/mL (ref 24–336)

## 2023-05-09 NOTE — Assessment & Plan Note (Signed)
Patient was previously referred to genetic counseling multiple times. He canceled his appointment.

## 2023-05-09 NOTE — Assessment & Plan Note (Signed)
#  Stage I colon cancer status post right hemicolectomy -03/11/2020 Labs are reviewed and discussed with patient. CEA is normal and stable.  Clinically he is doing well.  Recommend follow up in 1 year

## 2023-05-09 NOTE — Progress Notes (Signed)
Hematology/Oncology Progress note [phone] [phone]      Patient Care Team: Emogene Morgan, MD as PCP - General (Family Medicine) Rickard Patience, MD as Consulting Physician (Oncology) Henrene Dodge, MD as Consulting Physician (General Surgery) Benita Gutter, RN as Oncology Nurse Navigator  ASSESSMENT & PLAN:   Malignant neoplasm of colon [facility]) #Stage I colon cancer status post right hemicolectomy -03/11/2020 Labs are reviewed and discussed with patient. CEA is normal and stable.  Clinically he is doing well.  Recommend follow up in 1 year  Family history of cancer Patient was previously referred to genetic counseling multiple times. He canceled his appointment.     Orders Placed This Encounter  Procedures   CBC with Differential (Cancer Center Only)    Standing Status:   Future    Standing Expiration Date:   05/08/2024   CMP (Cancer Center only)    Standing Status:   Future    Standing Expiration Date:   05/08/2024   CEA    Standing Status:   Future    Standing Expiration Date:   05/08/2024   Iron and TIBC    Standing Status:   Future    Standing Expiration Date:   05/08/2024   Ferritin    Standing Status:   Future    Standing Expiration Date:   05/08/2024   Follow up in 12 months.  All questions were answered. The patient knows to call the clinic with any problems, questions or concerns.  Rickard Patience, MD, PhD [facility] Hematology Oncology 05/09/2023   CHIEF COMPLAINTS/REASON FOR VISIT:  Follow-up colon cancer  HISTORY OF PRESENTING ILLNESS:   Carl [name]. is a  52 y.o.  male with PMH listed below was seen in consultation at the request of  Emogene Morgan, MD  for evaluation of colon cancer  01/26/2020, CT abdomen pelvis at [facility] showed intussusception of the large bowel at the hepatic flexure with irregular bowel wall thickening. Patient established care with gastroenterology and had colonoscopy on 02/14/2020 which showed 5 to 7 mm polyps  in ascending colon and in the cecum.  Resected and retrieved.  5 mm polyp in the ascending colon, resected and retrieved. There is a tumor found at 60 cm proximal to the anus.  Biopsied.  Pathology showed tubular adenoma x2, tubulovillous adenoma x1, 60 cm to anus biopsy showed villous adenoma, definitive high-grade dysplasia is not identified. 02/28/2020, patient had another colonoscopy to place the tatoo and bed placement.  Colonoscopy showed large polyp found in the hepatic flexure.  Polyp was multilobulated.   5 mm polyp in the sigmoid colon, resected and retrieved.  Pathology showed serrated polyp negative for dysplasia and malignancy.  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  All margins were negative for invasive carcinoma.  No lymphovascular invasion or perineural invasion was identified. pT1 pN0.  MLH1, MSH 2, PMS2 intact, MSH 6 heterogeneous staining.  Negative hypermethylation of the MLH1 gene promoter  Patient referred to oncology to discuss about diagnosis and management plan. Patient is accompanied by his mother. Denies any family history of colon cancer.  Family history is positive for maternal uncle who was diagnosed with prostate cancer.  Maternal great aunt was diagnosed with breast cancer. Patient has no new complaints.  He has history of schizophrenia and seizure.   1 year post op colonoscopy  on 03/18/2021 Small polyps at anastomosis, resected- pathology at tubular adenoma.  INTERVAL HISTORY Carl [name]. is a 52 y.o. male who has above history reviewed by me today presents for follow up visit for management of stage I colon cancer Problems and complaints are listed below: Patient was accompanied by his father patient is a poor historian. He denies any abdominal  pain,bowel habit changes, black or bloody bowel movements.  Weight is stable.   Review of Systems  Constitutional:  Negative for appetite change, chills, fatigue, fever and unexpected weight change.  HENT:   Negative for hearing loss and voice change.   Eyes:  Negative for eye problems and icterus.  Respiratory:  Negative for chest tightness and shortness of breath.        Occasional cough  Cardiovascular:  Negative for chest pain and leg swelling.  Gastrointestinal:  Negative for abdominal distention and abdominal pain.  Endocrine: Negative for hot flashes.  Genitourinary:  Negative for difficulty urinating, dysuria and frequency.   Musculoskeletal:  Negative for arthralgias.  Skin:  Negative for itching and rash.  Neurological:  Negative for light-headedness and numbness.  Hematological:  Negative for adenopathy. Does not bruise/bleed easily.  Psychiatric/Behavioral:  Negative for confusion.     MEDICAL HISTORY:  Past Medical History:  Diagnosis Date   Abdominal pain    Anxiety    Constipation    Decreased visual acuity    Nevus    Polyuria    Recurrent boils    scalp behind ear   Schizophrenia (HCC)    Seizures (HCC) 08/15/1990   due to haldol   Soft tissue mass    left scalp posterior    SURGICAL HISTORY: Past Surgical History:  Procedure Laterality Date   COLONOSCOPY WITH PROPOFOL N/A 02/14/2020   Procedure: COLONOSCOPY WITH PROPOFOL;  Surgeon: Pasty Spillers, MD;  Location: [facility] END[facility];  Service: Endoscopy;  Laterality: N/A;   COLONOSCOPY WITH PROPOFOL N/A 02/28/2020   Procedure: COLONOSCOPY WITH PROPOFOL;  Surgeon: Pasty Spillers, MD;  Location: [facility];  Service: Endoscopy;  Laterality: N/A;   COLONOSCOPY WITH PROPOFOL N/A 03/18/2021   Procedure: COLONOSCOPY WITH PROPOFOL;  Surgeon: Pasty Spillers, MD;  Location: [facility];  Service: Endoscopy;  Laterality: N/A;   LAPAROSCOPIC RIGHT COLECTOMY Right 03/11/2020   Procedure:  LAPAROSCOPIC RIGHT COLECTOMY, possible open;  Surgeon: Henrene Dodge, MD;  Location: [facility];  Service: General;  Laterality: Right;   MASS EXCISION Left 02/11/2015   Procedure: EXCISION MASS, left post neck;  Surgeon: Lattie Haw, MD;  Location: [facility];  Service: General;  Laterality: Left;    SOCIAL HISTORY: Social History   Socioeconomic History   Marital status: Single    Spouse name: Not on file   Number of children: Not on file   Years of education: Not on file   Highest education level: 12th grade  Occupational History   Occupation: on disability  Tobacco Use   Smoking status: Never   Smokeless tobacco: Never  Vaping Use   Vaping status: Never Used  Substance and Sexual Activity   Alcohol use: No    Alcohol/week: 0.0 standard drinks of alcohol   Drug use: No   Sexual activity: Not Currently  Other Topics Concern   Not on file  Social History Narrative   Not on file   Social Determinants of Health   Financial Resource Strain: Not on file  Food Insecurity: No Food Insecurity (08/18/2022)   Hunger Vital Sign  Worried About Programme researcher, broadcasting/film/video in the Last Year: Never true    Ran Out of Food in the Last Year: Never true  Transportation Needs: No Transportation Needs (08/18/2022)   PRAPARE - Administrator, Civil Service (Medical): No    Lack of Transportation (Non-Medical): No  Physical Activity: Not on file  Stress: Not on file  Social Connections: Not on file  Intimate Partner Violence: Not At Risk (08/18/2022)   Humiliation, Afraid, Rape, and Kick questionnaire    Fear of Current or Ex-Partner: No    Emotionally Abused: No    Physically Abused: No    Sexually Abused: No    FAMILY HISTORY: Family History  Problem Relation Age of Onset   Hyperlipidemia Mother    Hypertension Mother    Fibromyalgia Mother    Hypertension Father    Diabetes Father    Hyperlipidemia Father    Mental illness Neg Hx     ALLERGIES:  is allergic  to haldol [haloperidol].  MEDICATIONS:  Current Outpatient Medications  Medication Sig Dispense Refill   cetirizine (ZYRTEC) 10 MG tablet Take 10 mg by mouth daily as needed for allergies.     docusate sodium (COLACE) 100 MG capsule Take 1 capsule (100 mg total) by mouth daily. 30 capsule 1   famotidine (PEPCID) 20 MG tablet Take 20 mg by mouth 2 (two) times daily.     fluPHENAZine (PROLIXIN) 2.5 MG tablet TAKE 3 TABLETS (7.5 MG TOTAL) BY MOUTH 3TIMES DAILY 270 tablet 3   fluticasone (FLONASE) 50 MCG/ACT nasal spray Place 2 sprays into the nose daily as needed for allergies.      QUEtiapine (SEROQUEL XR) 400 MG 24 hr tablet TAKE 1 TABLET BY MOUTH AT BEDTIME 30 tablet 3   traZODone (DESYREL) 100 MG tablet Take 1.5-2 tablets (150-200 mg total) by mouth at bedtime as needed for sleep. Use only as needed for sleep 60 tablet 3   trihexyphenidyl (ARTANE) 2 MG tablet TAKE 1 TABLET BY MOUTH 2 TIMES DAILY WITH A MEAL FOR TREMORS 60 tablet 5   No current facility-administered medications for this visit.     PHYSICAL EXAMINATION: ECOG PERFORMANCE STATUS: 0 - Asymptomatic Vitals:   05/09/23 1329  BP: (!) 125/94  Pulse: 89  Resp: 18  Temp: (!) 96.9 F (36.1 C)   Filed Weights   05/09/23 1329  Weight: 157 lb (71.2 kg)    Physical Exam Constitutional:      General: He is not in acute distress. HENT:     Head: Normocephalic and atraumatic.  Eyes:     General: No scleral icterus. Cardiovascular:     Rate and Rhythm: Normal rate and regular rhythm.     Heart sounds: Normal heart sounds.  Pulmonary:     Effort: Pulmonary effort is normal. No respiratory distress.     Breath sounds: No wheezing.  Abdominal:     General: Bowel sounds are normal. There is no distension.     Palpations: Abdomen is soft.  Musculoskeletal:        General: No deformity. Normal range of motion.     Cervical back: Normal range of motion and neck supple.  Skin:    General: Skin is warm and dry.     Findings:  No erythema or rash.  Neurological:     Mental Status: He is alert. Mental status is at baseline.     Cranial Nerves: No cranial nerve deficit.     Coordination: Coordination  normal.  Psychiatric:        Mood and Affect: Mood normal.     LABORATORY DATA:  I have reviewed the data as listed     Latest Ref Rng & Units 05/09/2023    1:19 PM 08/17/2022    2:54 PM 04/13/2022   11:25 AM  CBC  WBC 4.0 - 10.5 K/uL 4.4  4.1  4.5   Hemoglobin 13.0 - 17.0 g/dL 16.1  09.6  04.5   Hematocrit 39.0 - 52.0 % 40.0  42.8  41.3   Platelets 150 - 400 K/uL 244  264  261       Latest Ref Rng & Units 05/09/2023    1:18 PM 08/22/2022    6:37 AM 08/20/2022    6:48 AM  CMP  Glucose 70 - 99 mg/dL 409  94  811   BUN 6 - 20 mg/dL 12  13  19    Creatinine 0.61 - 1.24 mg/dL 9.14  7.82  9.56   Sodium 135 - 145 mmol/L 136  138  141   Potassium 3.5 - 5.1 mmol/L 3.7  4.0  4.0   Chloride 98 - 111 mmol/L 102  103  102   CO2 22 - 32 mmol/L 28  25  27    Calcium 8.9 - 10.3 mg/dL 8.7  9.2  9.9   Total Protein 6.5 - 8.1 g/dL 6.4     Total Bilirubin <1.2 mg/dL 0.5     Alkaline Phos 38 - 126 U/L 52     AST 15 - 41 U/L 14     ALT 0 - 44 U/L 12           RADIOGRAPHIC STUDIES: I have personally reviewed the radiological images as listed and agreed with the findings in the report. No results found.

## 2023-05-10 ENCOUNTER — Telehealth: Payer: Self-pay | Admitting: Psychiatry

## 2023-05-10 DIAGNOSIS — F209 Schizophrenia, unspecified: Secondary | ICD-10-CM

## 2023-05-10 LAB — CEA: CEA: 3.6 ng/mL (ref 0.0–4.7)

## 2023-05-10 MED ORDER — QUETIAPINE FUMARATE ER 50 MG PO TB24: 50.0000 mg | ORAL_TABLET | Freq: Every day | ORAL | 1 refills | Status: DC

## 2023-05-10 NOTE — Telephone Encounter (Signed)
Pt was notified.  

## 2023-05-10 NOTE — Telephone Encounter (Signed)
I have reviewed EKG dated May 06, 2023-normal sinus rhythm, incomplete right bundle branch block. QTc-454.  Will increase Seroquel to 450 mg at bedtime to address current hallucinations and mood symptoms. I have sent a new prescription to pharmacy.

## 2023-05-11 ENCOUNTER — Ambulatory Visit (INDEPENDENT_AMBULATORY_CARE_PROVIDER_SITE_OTHER): Payer: 59 | Admitting: Psychiatry

## 2023-05-11 ENCOUNTER — Encounter: Payer: Self-pay | Admitting: Psychiatry

## 2023-05-11 VITALS — BP 113/83 | HR 87 | Temp 97.4°F | Ht 66.0 in | Wt 156.2 lb

## 2023-05-11 DIAGNOSIS — G2111 Neuroleptic induced parkinsonism: Secondary | ICD-10-CM | POA: Diagnosis not present

## 2023-05-11 DIAGNOSIS — Z9189 Other specified personal risk factors, not elsewhere classified: Secondary | ICD-10-CM | POA: Diagnosis not present

## 2023-05-11 DIAGNOSIS — F209 Schizophrenia, unspecified: Secondary | ICD-10-CM

## 2023-05-11 DIAGNOSIS — G4709 Other insomnia: Secondary | ICD-10-CM

## 2023-05-11 NOTE — Progress Notes (Signed)
BH MD OP Progress Note  05/11/2023 11:23 AM Carl [name].  MRN:  [id number]  Chief Complaint:  Chief Complaint  Patient presents with   Follow-up   Hallucinations   Anxiety   Medication Refill   HPI: Carl Randallis a 52 year old African-American male, single, lives in [place], has a history of schizophrenia, on multiple psychotropic medications, insomnia, tremors was evaluated in office today.  Patient being a limited historian collateral information obtained from mother,Carl [name].  According to mother he was keeping the living room dirty and was in the living room all day and hence she moved him from there to his bedroom where he has a TV and a couch and everything.  That kind of made him upset which triggered his most recent episode of psychosis.  However mother reports he has improved since the past couple of days and currently does not appear to have any active hallucinations.  They do not see him responding to internal stimuli like he did a week ago.  He does tend to have excessive sleepiness during the day.  He does not follow a good sleep hygiene and stays up watching TV till 2 AM.  Hence sleep continues to be all over the place.  Mother reports although she sets up his pillbox for him to take his medications it is like he may not be taking it as prescribed.  She has not picked up the higher dosage of Seroquel which was sent out to pharmacy yesterday.  He hence continues to be on his previous dosage of Seroquel and Prolixin.  Denies any side effects.  Patient today appeared to be calm, cooperative, responding to questions appropriately.  Did not appear to have any paranoia, did not appear to be responding to internal stimuli.  Patient denies any hallucinations today.  Denies any suicidality, homicidality or perceptual disturbances.  Reports the last time he had hallucinations where yesterday.  Patient denies any other concerns today.  Visit Diagnosis:    ICD-10-CM   1.  Schizophrenia, chronic condition with acute exacerbation (HCC)  F20.9     2. Other insomnia  G47.09    Lack of sleep hygiene, multiple psychotropics which are sedating    3. 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 At risk for prolonged QT interval syndrome  Z91.89       Past Psychiatric History: I have reviewed past psychiatric history from progress note on 09/09/2021.  I have reviewed past psychiatric history from progress note on 08/21/2021.  Recent inpatient behavioral health admission-[facility]-08/17/2022 - 08/18/2022.  Past Medical History:  Past Medical History:  Diagnosis Date   Abdominal pain    Anxiety    Constipation    Decreased visual acuity    Nevus    Polyuria    Recurrent boils    scalp behind ear   Schizophrenia (HCC)    Seizures (HCC) 08/15/1990   due to haldol   Soft tissue mass    left scalp posterior    Past Surgical History:  Procedure Laterality Date   COLONOSCOPY WITH PROPOFOL N/A 02/14/2020   Procedure: COLONOSCOPY WITH PROPOFOL;  Surgeon: Pasty Spillers, MD;  Location: [facility];  Service: Endoscopy;  Laterality: N/A;   COLONOSCOPY WITH PROPOFOL N/A 02/28/2020   Procedure: COLONOSCOPY WITH PROPOFOL;  Surgeon: Pasty Spillers, MD;  Location: [facility];  Service: Endoscopy;  Laterality: N/A;   COLONOSCOPY WITH PROPOFOL N/A 03/18/2021   Procedure: COLONOSCOPY WITH PROPOFOL;  Surgeon: Melodie Bouillon  B, MD;  Location: [facility];  Service: Endoscopy;  Laterality: N/A;   LAPAROSCOPIC RIGHT COLECTOMY Right 03/11/2020   Procedure: LAPAROSCOPIC RIGHT COLECTOMY, possible open;  Surgeon: Henrene Dodge, MD;  Location: [facility];  Service: General;  Laterality: Right;   MASS EXCISION Left 02/11/2015   Procedure: EXCISION MASS, left post neck;  Surgeon: Lattie Haw, MD;  Location: [facility];  Service: General;  Laterality: Left;    Family Psychiatric History: Reviewed for Family psychiatric history from  progress note on 09/09/2021.  Family History:  Family History  Problem Relation Age of Onset   Hyperlipidemia Mother    Hypertension Mother    Fibromyalgia Mother    Hypertension Father    Diabetes Father    Hyperlipidemia Father    Schizophrenia Cousin    Schizophrenia Other    Mental illness Neg Hx     Social History: Reviewed social history from progress note on 09/09/2021. Social History   Socioeconomic History   Marital status: Single    Spouse name: Not on file   Number of children: Not on file   Years of education: Not on file   Highest education level: 12th grade  Occupational History   Occupation: on disability  Tobacco Use   Smoking status: Never   Smokeless tobacco: Never  Vaping Use   Vaping status: Never Used  Substance and Sexual Activity   Alcohol use: No    Alcohol/week: 0.0 standard drinks of alcohol   Drug use: No   Sexual activity: Not Currently  Other Topics Concern   Not on file  Social History Narrative   Not on file   Social Determinants of Health   Financial Resource Strain: Not on file  Food Insecurity: No Food Insecurity (08/18/2022)   Hunger Vital Sign    Worried About Running Out of Food in the Last Year: Never true    Ran Out of Food in the Last Year: Never true  Transportation Needs: No Transportation Needs (08/18/2022)   PRAPARE - Administrator, Civil Service (Medical): No    Lack of Transportation (Non-Medical): No  Physical Activity: Not on file  Stress: Not on file  Social Connections: Not on file    Allergies:  Allergies  Allergen Reactions   Haldol [Haloperidol] Other (See Comments)    Causes seizures    Metabolic Disorder Labs: Lab Results  Component Value Date   HGBA1C 5.1 08/22/2022   MPG 99.67 08/22/2022   MPG 111 08/24/2021   Lab Results  Component Value Date   PROLACTIN 41.2 (H) 08/24/2021   Lab Results  Component Value Date   CHOL 176 08/22/2022   TRIG 129 08/22/2022   HDL 43 08/22/2022    CHOLHDL 4.1 08/22/2022   VLDL 26 08/22/2022   LDLCALC 107 (H) 08/22/2022   LDLCALC 149 (H) 08/24/2021   Lab Results  Component Value Date   TSH 2.016 08/22/2022   TSH 1.047 08/24/2021    Therapeutic Level Labs: No results found for: "LITHIUM" Lab Results  Component Value Date   VALPROATE 97 04/28/2019   VALPROATE 99 09/09/2017   No results found for: "CBMZ"  Current Medications: Current Outpatient Medications  Medication Sig Dispense Refill   cetirizine (ZYRTEC) 10 MG tablet Take 10 mg by mouth daily as needed for allergies.     docusate sodium (COLACE) 100 MG capsule Take 1 capsule (100 mg total) by mouth daily. 30 capsule 1   famotidine (PEPCID) 20 MG tablet Take 20  mg by mouth 2 (two) times daily.     fluPHENAZine (PROLIXIN) 2.5 MG tablet TAKE 3 TABLETS (7.5 MG TOTAL) BY MOUTH 3TIMES DAILY 270 tablet 3   fluticasone (FLONASE) 50 MCG/ACT nasal spray Place 2 sprays into the nose daily as needed for allergies.      QUEtiapine (SEROQUEL XR) 400 MG 24 hr tablet TAKE 1 TABLET BY MOUTH AT BEDTIME 30 tablet 3   QUEtiapine (SEROQUEL XR) 50 MG TB24 24 hr tablet Take 1 tablet (50 mg total) by mouth at bedtime. Take along with 400 mg at bedtime , total of 450 mg 30 tablet 1   traZODone (DESYREL) 100 MG tablet Take 1.5-2 tablets (150-200 mg total) by mouth at bedtime as needed for sleep. Use only as needed for sleep 60 tablet 3   trihexyphenidyl (ARTANE) 2 MG tablet TAKE 1 TABLET BY MOUTH 2 TIMES DAILY WITH A MEAL FOR TREMORS 60 tablet 5   No current facility-administered medications for this visit.     Musculoskeletal: Strength & Muscle Tone: within normal limits Gait & Station: normal Patient leans: N/A  Psychiatric Specialty Exam: Review of Systems  Unable to perform ROS: Psychiatric disorder    Blood pressure 113/83, pulse 87, temperature (!) 97.4 F (36.3 C), temperature source Skin, height 5\' 6"  (1.676 m), weight 156 lb 3.2 oz (70.9 kg).Body mass index is 25.21 kg/m.   General Appearance: Fairly Groomed  Eye Contact:  Fair  Speech:  Clear and Coherent  Volume:  Normal  Mood:  Anxious  Affect:  Appropriate  Thought Process:  Goal Directed and Descriptions of Associations: Intact  Orientation:  Other:  Self, situation, date  Thought Content: Hallucinations: Auditory improving  Suicidal Thoughts: Denies  Homicidal Thoughts:  No  Memory:  Immediate;   Good  Judgement:  Impaired  Insight:  Shallow  Psychomotor Activity:  Normal  Concentration:  Concentration: Poor and Attention Span: Poor  Recall:  Poor  Fund of Knowledge: Poor  Language: Fair  Akathisia:  No  Handed:  Right  AIMS (if indicated): done  Assets:  Desire for Improvement Housing Social Support  ADL's:  Intact  Cognition: Limited  Sleep:   Has lack of sleep hygiene and hence sleep is all over the place.   Screenings: AIMS    Flowsheet Row Office Visit from 05/11/2023 in [facility] Office Visit from 11/17/2022 in [facility] Video Visit from 09/29/2022 in [facility] Office Visit from 09/09/2022 in [facility]  AIMS Total Score 0 0 0 0 0      AUDIT    Flowsheet Row [facility]) [facility] Admission (Discharged) from 09/05/2017 in [facility]  Alcohol Use Disorder Identification Test Final Score (AUDIT) 0 0      GAD-7    Flowsheet Row Office Visit from 05/11/2023 in [facility] Office Visit from 11/17/2022 in [facility] Office Visit from 09/09/2022 in [facility] Office Visit from 05/18/2022 in [facility] Office Visit from 03/02/2022 in [facility]  Total GAD-7 Score 0 0 6 0 0      PHQ2-9    Flowsheet Row Office Visit from 05/11/2023 in [facility] Office  Visit from 11/17/2022 in [facility] Office Visit from 09/09/2022 in [facility] Office Visit from 05/18/2022 in [facility] Office Visit from 03/02/2022 in [facility]  PHQ-2 Total Score 0 0 0 0 0  PHQ-9 Total Score -- -- 5 0 --      Flowsheet Row Office Visit from 05/11/2023 in [facility] Video Visit from 05/05/2023 in [facility] Office Visit from 11/17/2022 in [facility]  C-SSRS RISK CATEGORY No Risk No Risk No Risk        Assessment and Plan: Pasha Broad Randallis a 52 year old African-American male on disability, has a history of schizophrenia, failed trials of medications like Clozaril(developed side effects)recent situational stressors which contributed to worsening hallucinations and anxiety however currently improving will benefit from the following plan.  Plan Schizophrenia-unstable Prolixin 7.5 mg p.o. 3 times daily Seroquel extended release increased to 450 mg p.o. nightly. EKG reviewed and discussed with patient-QTc-454-May 06, 2023.  Insomnia-unstable Patient lacks sleep hygiene and hence sleep is all over the place.  Patient encouraged to work on sleep hygiene techniques.  He takes naps during the day and goes to bed late at around 2 AM. Continue trazodone 150-200 mg p.o. nightly as needed  Neuroleptic induced parkinsonism-improving Artane 2 mg p.o. twice daily  At risk for prolonged QT syndrome-EKG reviewed and  discussed-05/06/2023-QTc-454.  Collateral information obtained from mother as noted above.  Discussed referral [facility] at [facility] given his need for a higher level of care due to ongoing psychotic episodes, being on multiple antipsychotic medications, recent inpatient behavioral health admission.  I have completed an online referral form for this patient.   Collaboration of Care: Collaboration of Care: Other I have referred this patient to psychotherapeutic services-as noted above.  Patient/Guardian was advised Release of Information must be obtained prior to any record release in order to collaborate their care with an outside provider. Patient/Guardian was advised if they have not already done so to contact the registration department to sign all necessary forms in order for [place] to release information regarding their care.   Consent: Patient/Guardian gives verbal consent for treatment and assignment of benefits for services provided during this visit. Patient/Guardian expressed understanding and agreed to proceed.   Follow-up in clinic in 4 weeks or sooner if needed.  This note was generated in part or whole with voice recognition software. Voice recognition is usually quite accurate but there are transcription errors that can and very often do occur. I apologize for any typographical errors that were not detected and corrected.    Carl Longs, MD 05/11/2023, 11:23 AM

## 2023-05-12 ENCOUNTER — Telehealth: Payer: Self-pay | Admitting: Psychiatry

## 2023-05-12 NOTE — Telephone Encounter (Signed)
Noted  

## 2023-05-12 NOTE — Telephone Encounter (Signed)
I have referred this patient to psychotherapeutic services, [facility].  However received an email as below.  'We have received your referral for our [facility] program. To proceed, we need a copy of the most recent CCA recommending PSR services, a medication list, and a letter from the legal guardian, if applicable. If you have any questions, please contact us at [phone].     Thank You, Eudelia Bunch, BA QP Program Coordinator [facility]  [address]  [place], [place] [address]  [phone] (Office)  [phone] (Fax)'    Will need to contact patient/family to sign an ROI so we can send information to them.

## 2023-05-26 ENCOUNTER — Ambulatory Visit: Payer: 59 | Admitting: Psychiatry

## 2023-06-09 ENCOUNTER — Encounter: Payer: Self-pay | Admitting: Psychiatry

## 2023-06-09 ENCOUNTER — Telehealth: Payer: 59 | Admitting: Psychiatry

## 2023-06-09 DIAGNOSIS — G4709 Other insomnia: Secondary | ICD-10-CM | POA: Diagnosis not present

## 2023-06-09 DIAGNOSIS — G2111 Neuroleptic induced parkinsonism: Secondary | ICD-10-CM

## 2023-06-09 DIAGNOSIS — F209 Schizophrenia, unspecified: Secondary | ICD-10-CM

## 2023-06-09 NOTE — Progress Notes (Signed)
Virtual Visit via Video Note  I connected with Carl [name]. on 06/09/23 at  3:00 PM EST by a video enabled telemedicine application and verified that I am speaking with the correct person using two identifiers.  Location Provider Location : [facility] Patient Location : Home  Participants: Patient , Mother ,Provider    I discussed the limitations of evaluation and management by telemedicine and the availability of in person appointments. The patient expressed understanding and agreed to proceed.   I discussed the assessment and treatment plan with the patient. The patient was provided an opportunity to ask questions and all were answered. The patient agreed with the plan and demonstrated an understanding of the instructions.   The patient was advised to call back or seek an in-person evaluation if the symptoms worsen or if the condition fails to improve as anticipated.   BH MD OP Progress Note  06/10/2023 1:37 PM Carl [name].  MRN:  [id number]  Chief Complaint:  Chief Complaint  Patient presents with   Follow-up   Schizophrenia   Medication Refill   HPI: Carl Randallis a 52 year old African-American male, single, lives in [place], has a history of schizophrenia, was evaluated by telemedicine today.  Patient being a limited historian collateral information obtained from mother.  The patient, diagnosed with schizophrenia and insomnia, has been on a regimen of Prolixin 7.5 mg three times daily and Seroquel extended release 450 mg at bedtime. Since the last appointment in early November, the patient reports feeling "fine" and "better." He has not reported any side effects or concerns with his medications.  The patient's sleep has improved. Patient denies any hallucinations or suicidal ideation. He spends his time mostly watching TV and reading magazines. He has declined participation in a program that could provide peer support and other resources.  The patient's mother,  who is his primary caregiver, reports that the patient's condition has improved slightly since the adjustment of his medication.  As per mother patient refused to go for a consultation with psychotherapeutic services at together house to which he was referred.  Patient is not interested at this time.  Patient appeared to be alert, oriented to person place time situation during the session.  3 word memory immediate 3 out of 3, after 5 minutes 3 out of 3.  Patient was able to spell the word 'world' forward.  Attention and focus improving.  Patient denies any other concerns today.    Visit Diagnosis:    ICD-10-CM   1. Schizophrenia, chronic condition with acute exacerbation (HCC)  F20.9    improving    2. Other insomnia  G47.09    Lack of sleep hygiene, multiple psychotropics which are sedating    3. 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  I have reviewed past psychiatric history from progress note on 08/21/2021.  Recent inpatient behavioral health admission-[facility] at [facility]-08/17/2022 - 08/18/2022.  Past Medical History:  Past Medical History:  Diagnosis Date   Abdominal pain    Anxiety    Constipation    Decreased visual acuity    Nevus    Polyuria    Recurrent boils    scalp behind ear   Schizophrenia (HCC)    Seizures (HCC) 08/15/1990   due to haldol   Soft tissue mass    left scalp posterior    Past Surgical History:  Procedure Laterality  Date   COLONOSCOPY WITH PROPOFOL N/A 02/14/2020   Procedure: COLONOSCOPY WITH PROPOFOL;  Surgeon: Pasty Spillers, MD;  Location: [facility];  Service: Endoscopy;  Laterality: N/A;   COLONOSCOPY WITH PROPOFOL N/A 02/28/2020   Procedure: COLONOSCOPY WITH PROPOFOL;  Surgeon: Pasty Spillers, MD;  Location: [facility];  Service: Endoscopy;  Laterality: N/A;   COLONOSCOPY WITH PROPOFOL N/A 03/18/2021    Procedure: COLONOSCOPY WITH PROPOFOL;  Surgeon: Pasty Spillers, MD;  Location: [facility];  Service: Endoscopy;  Laterality: N/A;   LAPAROSCOPIC RIGHT COLECTOMY Right 03/11/2020   Procedure: LAPAROSCOPIC RIGHT COLECTOMY, possible open;  Surgeon: Henrene Dodge, MD;  Location: [facility];  Service: General;  Laterality: Right;   MASS EXCISION Left 02/11/2015   Procedure: EXCISION MASS, left post neck;  Surgeon: Lattie Haw, MD;  Location: [facility];  Service: General;  Laterality: Left;    Family Psychiatric History: I have reviewed family psychiatric history from progress note on 09/09/2021.  Family History:  Family History  Problem Relation Age of Onset   Hyperlipidemia Mother    Hypertension Mother    Fibromyalgia Mother    Hypertension Father    Diabetes Father    Hyperlipidemia Father    Schizophrenia Cousin    Schizophrenia Other    Mental illness Neg Hx     Social History: I have reviewed social history from progress note on 09/09/2021. Social History   Socioeconomic History   Marital status: Single    Spouse name: Not on file   Number of children: Not on file   Years of education: Not on file   Highest education level: 12th grade  Occupational History   Occupation: on disability  Tobacco Use   Smoking status: Never   Smokeless tobacco: Never  Vaping Use   Vaping status: Never Used  Substance and Sexual Activity   Alcohol use: No    Alcohol/week: 0.0 standard drinks of alcohol   Drug use: No   Sexual activity: Not Currently  Other Topics Concern   Not on file  Social History Narrative   Not on file   Social Determinants of Health   Financial Resource Strain: Not on file  Food Insecurity: No Food Insecurity (08/18/2022)   Hunger Vital Sign    Worried About Running Out of Food in the Last Year: Never true    Ran Out of Food in the Last Year: Never true  Transportation Needs: No Transportation Needs (08/18/2022)   PRAPARE - Therapist, art (Medical): No    Lack of Transportation (Non-Medical): No  Physical Activity: Not on file  Stress: Not on file  Social Connections: Not on file    Allergies:  Allergies  Allergen Reactions   Haldol [Haloperidol] Other (See Comments)    Causes seizures    Metabolic Disorder Labs: Lab Results  Component Value Date   HGBA1C 5.1 08/22/2022   MPG 99.67 08/22/2022   MPG 111 08/24/2021   Lab Results  Component Value Date   PROLACTIN 41.2 (H) 08/24/2021   Lab Results  Component Value Date   CHOL 176 08/22/2022   TRIG 129 08/22/2022   HDL 43 08/22/2022   CHOLHDL 4.1 08/22/2022   VLDL 26 08/22/2022   LDLCALC 107 (H) 08/22/2022   LDLCALC 149 (H) 08/24/2021   Lab Results  Component Value Date   TSH 2.016 08/22/2022   TSH 1.047 08/24/2021    Therapeutic Level Labs: No results found for: "LITHIUM" Lab Results  Component Value Date   VALPROATE 97 04/28/2019   VALPROATE 99 09/09/2017   No results found for: "CBMZ"  Current Medications: Current Outpatient Medications  Medication Sig Dispense Refill   cetirizine (ZYRTEC) 10 MG tablet Take 10 mg by mouth daily as needed for allergies.     docusate sodium (COLACE) 100 MG capsule Take 1 capsule (100 mg total) by mouth daily. 30 capsule 1   famotidine (PEPCID) 20 MG tablet Take 20 mg by mouth 2 (two) times daily.     fluPHENAZine (PROLIXIN) 2.5 MG tablet TAKE 3 TABLETS (7.5 MG TOTAL) BY MOUTH 3TIMES DAILY 270 tablet 3   fluticasone (FLONASE) 50 MCG/ACT nasal spray Place 2 sprays into the nose daily as needed for allergies.      QUEtiapine (SEROQUEL XR) 400 MG 24 hr tablet TAKE 1 TABLET BY MOUTH AT BEDTIME 30 tablet 3   QUEtiapine (SEROQUEL XR) 50 MG TB24 24 hr tablet Take 1 tablet (50 mg total) by mouth at bedtime. Take along with 400 mg at bedtime , total of 450 mg 30 tablet 1   traZODone (DESYREL) 100 MG tablet Take 1.5-2 tablets (150-200 mg total) by mouth at bedtime as needed for sleep. Use only as  needed for sleep 60 tablet 3   trihexyphenidyl (ARTANE) 2 MG tablet TAKE 1 TABLET BY MOUTH 2 TIMES DAILY WITH A MEAL FOR TREMORS 60 tablet 5   No current facility-administered medications for this visit.     Musculoskeletal: Strength & Muscle Tone:  UTA Gait & Station:  Seated Patient leans: N/A  Psychiatric Specialty Exam: Review of Systems  Psychiatric/Behavioral: Negative.      There were no vitals taken for this visit.There is no height or weight on file to calculate BMI.  General Appearance: Fairly Groomed  Eye Contact:  Fair  Speech:  Clear and Coherent  Volume:  Normal  Mood:  Euthymic  Affect:  Full Range  Thought Process:  Linear and Descriptions of Associations: Intact  Orientation:  Full (Time, Place, and Person)  Thought Content: Logical   Suicidal Thoughts:  No  Homicidal Thoughts:  No  Memory:  Immediate;   Fair Recent;   Fair Remote;   Poor  Judgement:  Fair  Insight:  Fair  Psychomotor Activity:  Normal  Concentration:  Concentration: Fair and Attention Span: Fair  Recall:  [employer] of Knowledge: Fair  Language: Fair  Akathisia:  No  Handed:  Right  AIMS (if indicated): not done  Assets:  Communication Skills Desire for Improvement Housing Talents/Skills Transportation  ADL's:  Intact  Cognition: baseline  Sleep:   improving   Screenings: AIMS    Flowsheet Row Office Visit from 05/11/2023 in [facility] Office Visit from 11/17/2022 in [facility] Video Visit from 09/29/2022 in [facility] Office Visit from 09/09/2022 in [facility]  AIMS Total Score 0 0 0 0 0      AUDIT    Flowsheet Row [facility]) [facility] Ad[facility] (Discharged)  from 09/05/2017 in [facility]  Alcohol Use Disorder Identification Test Final Score (AUDIT) 0 0      GAD-7    Flowsheet Row Office Visit from 05/11/2023 in [facility] Office Visit from 11/17/2022 in [facility] Office Visit  from 09/09/2022 in [facility] Office Visit from 05/18/2022 in [facility] Office Visit from 03/02/2022 in [facility]  Total GAD-7 Score 0 0 6 0 0      PHQ2-9    Flowsheet Row Office Visit from 05/11/2023 in [facility] Office Visit from 11/17/2022 in [facility] Office Visit from 09/09/2022 in [facility] Office Visit from 05/18/2022 in [facility] Office Visit from 03/02/2022 in [facility]  PHQ-2 Total Score 0 0 0 0 0  PHQ-9 Total Score -- -- 5 0 --      Flowsheet Row Video Visit from 06/09/2023 in [facility] Office Visit from 05/11/2023 in [facility] Video Visit from 05/05/2023 in [facility]  C-SSRS RISK CATEGORY No Risk No Risk No Risk        Assessment and Plan: Carl Randallis a 52 year old African-American male on disability, has a history of schizophrenia, failed trials of medications like Clozaril(developed side effects), currently improving on the higher dosage of Seroquel, although noncompliant with referral to psychotherapeutic services as referred last visit, discussed assessment and plan as noted below.   Schizophrenia-improving Chronic schizophrenia, managed with fluphenazine 7.5 mg TID and Seroquel XR 450 mg  QHS. No hallucinations or self-harm ideation reported. Resistant to social engagement programs despite discussion of benefits. - Continue fluphenazine 7.5 mg TID - Continue Seroquel XR 450 mg QHS - Encourage participation in social support programs/therapeutic services.  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 Refill needed. - Continue trihexyphenidyl 2 mg BID - Instruct mother to contact pharmacy for refills  Insomnia -improving  Chronic insomnia managed with trazodone 100 mg (1.5 to 2 tablets) QHS PRN. Reinforced sleep hygiene. - Continue trazodone 100 mg (1.5 to 2 tablets) QHS PRN - Reinforce sleep hygiene practices  - Encourage reading books and other brain-stimulating activities  Follow-up - Schedule follow-up appointment for March 4th at 11:30 AM via video.   Collaboration of Care: Collaboration of Care: Patient refused AEB patient refused referral/noncompliant with referral to psychotherapeutic services collateral information obtained from mother..  Patient/Guardian was advised Release of Information must be obtained prior to any record release in order to collaborate their care with an outside provider. Patient/Guardian was advised if they have not already done so to contact the registration department to sign all necessary forms in order for [place] to release information regarding their care.   Consent: Patient/Guardian gives verbal consent for treatment and assignment of benefits for services provided during this visit. Patient/Guardian expressed understanding and agreed to proceed.   This note was generated in part or whole with voice recognition software. Voice recognition is usually quite accurate but there are transcription errors that can and very often do occur. I apologize for any typographical errors that were not detected and corrected.    Jomarie Longs, MD 06/10/2023, 1:37 PM

## 2023-07-14 ENCOUNTER — Emergency Department
Admission: EM | Admit: 2023-07-14 | Discharge: 2023-07-15 | Disposition: A | Discharge status: Home / Self Care | Payer: 59 | Attending: Emergency Medicine | Admitting: Emergency Medicine

## 2023-07-14 ENCOUNTER — Encounter: Payer: Self-pay | Admitting: Emergency Medicine

## 2023-07-14 ENCOUNTER — Other Ambulatory Visit: Payer: Self-pay

## 2023-07-14 DIAGNOSIS — R454 Irritability and anger: Secondary | ICD-10-CM | POA: Diagnosis present

## 2023-07-14 DIAGNOSIS — R4689 Other symptoms and signs involving appearance and behavior: Secondary | ICD-10-CM

## 2023-07-14 DIAGNOSIS — Z638 Other specified problems related to primary support group: Secondary | ICD-10-CM | POA: Insufficient documentation

## 2023-07-14 DIAGNOSIS — F250 Schizoaffective disorder, bipolar type: Secondary | ICD-10-CM | POA: Diagnosis present

## 2023-07-14 DIAGNOSIS — Z79899 Other long term (current) drug therapy: Secondary | ICD-10-CM | POA: Diagnosis not present

## 2023-07-14 LAB — ETHANOL: Alcohol, Ethyl (B): <10 mg/dL (ref <10)

## 2023-07-14 LAB — URINE DRUG SCREEN, QUALITATIVE (ARMC ONLY)
Amphetamines, Ur Screen: NOT DETECTED
Barbiturates, Ur Screen: NOT DETECTED
Benzodiazepine, Ur Scrn: NOT DETECTED
Cannabinoid 50 Ng, Ur ~~LOC~~: NOT DETECTED
Cocaine Metabolite,Ur ~~LOC~~: NOT DETECTED
MDMA (Ecstasy)Ur Screen: NOT DETECTED
Methadone Scn, Ur: NOT DETECTED
Opiate, Ur Screen: NOT DETECTED
Phencyclidine (PCP) Ur S: NOT DETECTED
Tricyclic, Ur Screen: POSITIVE — AB

## 2023-07-14 LAB — COMPREHENSIVE METABOLIC PANEL
ALT: 13 U/L (ref 0–44)
AST: 15 U/L (ref 15–41)
Albumin: 4.1 g/dL (ref 3.5–5.0)
Alkaline Phosphatase: 51 U/L (ref 38–126)
Anion gap: 9 (ref 5–15)
BUN: 12 mg/dL (ref 6–20)
CO2: 25 mmol/L (ref 22–32)
Calcium: 9.1 mg/dL (ref 8.9–10.3)
Chloride: 101 mmol/L (ref 98–111)
Creatinine, Ser: 1.23 mg/dL (ref 0.61–1.24)
GFR, Estimated: >60 mL/min (ref >60)
Glucose, Bld: 86 mg/dL (ref 70–99)
Potassium: 3.9 mmol/L (ref 3.5–5.1)
Sodium: 135 mmol/L (ref 135–145)
Total Bilirubin: 0.6 mg/dL (ref 0.0–1.2)
Total Protein: 6.9 g/dL (ref 6.5–8.1)

## 2023-07-14 LAB — CBC
HCT: 41.9 % (ref 39.0–52.0)
Hemoglobin: 14.1 g/dL (ref 13.0–17.0)
MCH: 27.1 pg (ref 26.0–34.0)
MCHC: 33.7 g/dL (ref 30.0–36.0)
MCV: 80.4 fL (ref 80.0–100.0)
Platelets: 273 10*3/uL (ref 150–400)
RBC: 5.21 MIL/uL (ref 4.22–5.81)
RDW: 12.9 % (ref 11.5–15.5)
WBC: 4 10*3/uL (ref 4.0–10.5)
nRBC: 0 % (ref 0.0–0.2)

## 2023-07-14 LAB — SALICYLATE LEVEL: Salicylate Lvl: <7 mg/dL — ABNORMAL LOW (ref 7.0–30.0)

## 2023-07-14 LAB — ACETAMINOPHEN LEVEL: Acetaminophen (Tylenol), Serum: <10 ug/mL — ABNORMAL LOW (ref 10–30)

## 2023-07-14 NOTE — ED Notes (Signed)
Vol /psych consult pending 

## 2023-07-14 NOTE — ED Notes (Signed)
 Patient's belongings include: 1 black jacket, 1 black shirt, 1 jeans, 1 black belt, 2 white socks, 2 black shoes.

## 2023-07-14 NOTE — ED Provider Notes (Signed)
 [facility] Provider Note    Event Date/Time   First MD Initiated Contact with Patient 07/14/23 2005     (approximate)   History   Aggressive Behavior (/)   HPI  Carl [name]. is a 53 y.o. male Review of notes from December of this year by psychiatry advised patient does have a history of schizophrenia   Attempted to contact the patient's emergency contact listed as Mrs. Sellers.  Received message the number you have dialed is not in service   Patient reports he had an argument with a family member.  He went to [facility] and he was upset.  Reports that now he feels much calm down.  He does not want hurt himself or anyone else.  Reports he just got upset and got into an argument verbally with someone.  He denies any other concerns at this time not any pain or discomfort reports that he ate dinner well  Physical Exam   Triage Vital Signs: ED Triage Vitals  Encounter Vitals Group     BP 07/14/23 1836 112/74     Systolic BP Percentile --      Diastolic BP Percentile --      Pulse Rate 07/14/23 1836 96     Resp 07/14/23 1836 18     Temp 07/14/23 1836 98.6 F (37 C)     Temp src --      SpO2 07/14/23 1836 95 %     Weight --      Height --      Head Circumference --      Peak Flow --      Pain Score 07/14/23 1835 0     Pain Loc --      Pain Education --      Exclude from Growth Chart --     Most recent vital signs: Vitals:   07/14/23 1836  BP: 112/74  Pulse: 96  Resp: 18  Temp: 98.6 F (37 C)  SpO2: 95%     General: Awake, no distress.  Resting comfortably, has just finished meal tray without difficulty.  He is calm very pleasant CV:  Good peripheral perfusion.  Resp:  Normal effort.  Abd:  No distention.  Other:  Resting comfortably.  Denies any desire to harm himself or anyone else.  Does not appear to be responding to any external stimuli.   ED Results / Procedures / Treatments   Labs (all labs ordered are listed, but only  abnormal results are displayed) Labs Reviewed  SALICYLATE LEVEL - Abnormal; Notable for the following components:      Result Value   Salicylate Lvl <7.0 (*)    All other components within normal limits  ACETAMINOPHEN  LEVEL - Abnormal; Notable for the following components:   Acetaminophen  (Tylenol ), Serum <10 (*)    All other components within normal limits  URINE DRUG SCREEN, QUALITATIVE ([facility] ONLY) - Abnormal; Notable for the following components:   Tricyclic, Ur Screen POSITIVE (*)    All other components within normal limits  COMPREHENSIVE METABOLIC PANEL  ETHANOL  CBC   CBC and comprehensive metabolic panel normal.  Salicylate and acetaminophen  levels normal.  EKG     RADIOLOGY     PROCEDURES:  Critical Care performed: No  Procedures   MEDICATIONS ORDERED IN ED: Medications - No data to display   IMPRESSION / MDM / ASSESSMENT AND PLAN / ED COURSE  I reviewed the triage vital signs and the nursing notes.  Differential diagnosis includes, but is not limited to, schizophrenia, adjustment disorder, anxiety, mood disorder etc.  No evidence of acute medical causation.  Patient resting comfortably calmly without acute complaint at this time.  Does not appear to meet criteria for involuntary commitment at this time.  Patient is medically cleared for psychiatric evaluation at 10 PM  Patient's presentation is most consistent with acute complicated illness / injury requiring diagnostic workup.  Labs show normal comprehensive metabolic panel and CBC        FINAL CLINICAL IMPRESSION(S) / ED DIAGNOSES   Final diagnoses:  Argumentative behavior     Rx / DC Orders   ED Discharge Orders     None        Note:  This document was prepared using Dragon voice recognition software and may include unintentional dictation errors.   Dicky Anes, MD 07/14/23 [phone]

## 2023-07-14 NOTE — Consult Note (Signed)
 [facility] Consult Note  Patient Name: Carl [name]. MRN: [id number] DOB: 10/13/1970 DATE OF Consult: 07/14/2023  PRIMARY PSYCHIATRIC DIAGNOSES  1.  Schizoaffective d/o, bipolar type   RECOMMENDATIONS   Inpt psych admission recommended:    [] YES       [x] NO  patient is calm, feels in control of behavior, mom reports feeling safe for patient to return home, will recommend discharge home and follow up with Dr. Coby   If yes:       []   Pt meets involuntary commitment criteria if not voluntary       []    Pt does not meet involuntary commitment criteria and must be         voluntary. If patient is not voluntary, then discharge is recommended.   Medication recommendations:  will maintain current outpatient medications as events of tonight appear situational; encouraged mom if patient continues to increase in irritability or noticing increase in auditory hallucinations to contact Dr. Eappen for appt Non-Medication recommendations:    Have a structured routine Using simple words and short sentences Asking your family member to repeat the instructions you gave them Remove any distractions when speaking them e.g., turn off radio or TV Offer only a couple of choices as it can hard for them if there are too many choices Encourage quiet time out if moods begin to escalate  Follow-Up Telepsychiatry C/L services:            []  We will continue to follow this patient with you.             [x]  Will sign off for now. Please re-consult our service as necessary.  Thank you for involving us  in the care of this patient. If you have any additional questions or concerns, please call [phone] and ask for me or the provider on-call.  TELEPSYCHIATRY ATTESTATION & CONSENT  As the provider for this telehealth consult, I attest that I verified the patient's identity using two separate identifiers, introduced myself to the patient, provided my credentials, disclosed my location, and performed this  encounter via a HIPAA-compliant, real-time, face-to-face, two-way, interactive audio and video platform and with the full consent and agreement of the patient (or guardian as applicable.)  Patient physical location: [facility] provider physical location: home office in state of [place].  Video start time: 22:15 (Central Time) Video end time: 22:30 (Central Time)  IDENTIFYING DATA  Carl [name]. is a 53 y.o. year-old male for whom a psychiatric consultation has been ordered by the primary provider. The patient was identified using two separate identifiers.  CHIEF COMPLAINT/REASON FOR CONSULT  I got in a fight with my family, argument  HISTORY OF PRESENT ILLNESS (HPI)  The patient with hx of treatment for Schizoaffective Disorder, Bipolar Type;   reconciled medication with mother via telephone, currently prescribed Fluphenazine  2.5 mg 3 tabs three times daily Artane   2mg  twice daily Pepcid  20mg  daily Quetiapine  400mg  at bedtime Quetiapine  50mg  at bedtime (added in Nov) Trazodone  150mg  po bedtime Docusate 100 mg bid    Patient presented to emergency department, referred by [facility] after police took him there for arguing with family.  Called mother Sharlet at [phone] for collateral  information.   His mother reported she called the police he came up on me and he has never done that before, his dad had to get on him and he argued with him, he just scared me, we've argued before, no he did not get physical  Mother reports he does argue back with them occasionally; she reports He took clozaril  for 30 yrs;he started having issues with blood work so was hospitalized this past June and started on fluphenazine  he had taken it before.  She reports he does have chronic auditory hallucinations.    Patient is sitting calmly in room, speech difficulty to understand at times, LCAS is in room and assists with understanding as needed.  Patient reports hx of intermittently hearing auditory  hallucinations denied commanding, denied negative, denied disturbing him, reports chronic; denied currently hearing voices during this episode  Today, client denied symptoms of depression with anergia, anhedonia, amotivation, no anxiety, no frequent worry, feeling restlessness, no reported panic symptoms, no reported obsessive/compulsive behaviors. Client denies active SI/HI ideations, plans or intent. There is no evidence of psychosis or delusional thinking.  Client no recent episodes of hypomania, hyperactivity, erratic/excessive spending, involvement in dangerous activities, self-inflated ego, grandiosity, or promiscuity.  sleeping 13-14hrs/24hrs, mom reports he naps during day then is up at night and he really needs his sleep or he doesn't do well;  appetite ok concentration alright, denied racy thoughts;  No self-harm behaviors. Reviewed active outpatient medication list/reviewed labs. Obtained Collateral information from medical record.  PAST PSYCHIATRIC HISTORY   Previous Psychiatric Hospitalizations: multiple, last admit was June Previous Detox/Residential treatments: denied Outpt treatment:  Dr Eappen last saw one month ago-mother was not sure of when next appt was Previous psychotropic medication trials: clozapine , depakote , risperidone , trazodone , benztropine , fluphenazine , quetiapine , haldol, trihexiphenydil Previous mental health diagnosis per client/MEDICAL RECORD NUMBERSchizoaffective disorder, bipolar type; Schizophrenia  Suicide attempts/self-injurious behaviors: denied history of suicidal/homicidal ideation/gestures; denied history of self-harm behaviors  PAST MEDICAL HISTORY  Past Medical History:  Diagnosis Date   Abdominal pain    Anxiety    Constipation    Decreased visual acuity    Nevus    Polyuria    Recurrent boils    scalp behind ear   Schizophrenia (HCC)    Seizures (HCC) 08/15/1990   due to haldol   Soft tissue mass    left scalp posterior     HOME  MEDICATIONS  PTA Medications  Medication Sig   docusate sodium  (COLACE) 100 MG capsule Take 1 capsule (100 mg total) by mouth daily.   fluticasone  (FLONASE ) 50 MCG/ACT nasal spray Place 2 sprays into the nose daily as needed for allergies.    cetirizine (ZYRTEC) 10 MG tablet Take 10 mg by mouth daily as needed for allergies.   famotidine  (PEPCID ) 20 MG tablet Take 20 mg by mouth 2 (two) times daily.   QUEtiapine  (SEROQUEL  XR) 400 MG 24 hr tablet TAKE 1 TABLET BY MOUTH AT BEDTIME   trihexyphenidyl  (ARTANE ) 2 MG tablet TAKE 1 TABLET BY MOUTH 2 TIMES DAILY WITH A MEAL FOR TREMORS   fluPHENAZine  (PROLIXIN ) 2.5 MG tablet TAKE 3 TABLETS (7.5 MG TOTAL) BY MOUTH 3TIMES DAILY   traZODone  (DESYREL ) 100 MG tablet Take 1.5-2 tablets (150-200 mg total) by mouth at bedtime as needed for sleep. Use only as needed for sleep   QUEtiapine  (SEROQUEL  XR) 50 MG TB24 24 hr tablet Take 1 tablet (50 mg total) by mouth at bedtime. Take along with 400 mg at bedtime , total of 450 mg    ALLERGIES  Allergies  Allergen Reactions   Haldol [Haloperidol] Other (See Comments)    Causes seizures    SOCIAL & SUBSTANCE USE HISTORY  Has 2 brothers Living Situation:lives with parents and brother Single; no children:  SSDI Education: GED Denied current legal issues.   Social Drivers of Health Y/N   Physicist, Medical Strain: N  Food Insecurity: N  Transportation Needs: N  Physical Activity: N  Stress: Y  Social Connections: N  Intimate Partner Violence: N  Housing Stability: N      Have you used/abused any of the following (include frequency/amt/last use):  a. Tobacco products N  b. ETOH N   c. Cannabis N   d. Cocaine N  e. Prescription Stimulants N   f. Methamphetamine N  g. Inhalants N  h. Sedative/sleeping pills N  i. Hallucinogens N  j. Street Opioids N   k. Prescription opioids N  l. Other: specify (spice, K2, bath salts, etc.)  N  Any history of substance related:  Blackouts:   - Tremors: - DUI: -  D/T's: -  seizures: -           FAMILY HISTORY  Family Psychiatric History (if known):  cousin schizophrenia, denied substance use or suicides  MENTAL STATUS EXAM (MSE)  Mental Status Exam: General Appearance: Neat  Orientation:  Full (Time, Place, and Person)  Memory:  Immediate;   Fair Recent;   Fair Remote;   Fair  Concentration:  Concentration: Good  Recall:  Fair  Attention  Good  Eye Contact:  Good  Speech:  Garbled  Language:  Fair  Volume:  Normal  Mood: good  Affect:  Congruent  Thought Process:  Coherent  Thought Content:  Logical  Suicidal Thoughts:  No  Homicidal Thoughts:  No  Judgement:  Impaired  Insight:  Lacking  Psychomotor Activity:  Normal  Akathisia:  Negative  Fund of Knowledge:  Fair    Assets:  Solicitor Social Support  Cognition:  WNL  ADL's:  Intact  AIMS (if indicated):       VITALS  Blood pressure 112/74, pulse 96, temperature 98.6 F (37 C), resp. rate 18, SpO2 95%.  LABS  Admission on 07/14/2023  Component Date Value Ref Range Status   Sodium 07/14/2023 135  135 - 145 mmol/L Final   Potassium 07/14/2023 3.9  3.5 - 5.1 mmol/L Final   Chloride 07/14/2023 101  98 - 111 mmol/L Final   CO2 07/14/2023 25  22 - 32 mmol/L Final   Glucose, Bld 07/14/2023 86  70 - 99 mg/dL Final   Glucose reference range applies only to samples taken after fasting for at least 8 hours.   BUN 07/14/2023 12  6 - 20 mg/dL Final   Creatinine, Ser 07/14/2023 1.23  0.61 - 1.24 mg/dL Final   Calcium 98/90/7974 9.1  8.9 - 10.3 mg/dL Final   Total Protein 98/90/7974 6.9  6.5 - 8.1 g/dL Final   Albumin  07/14/2023 4.1  3.5 - 5.0 g/dL Final   AST 98/90/7974 15  15 - 41 U/L Final   ALT 07/14/2023 13  0 - 44 U/L Final   Alkaline Phosphatase 07/14/2023 51  38 - 126 U/L Final   Total Bilirubin 07/14/2023 0.6  0.0 - 1.2 mg/dL Final   GFR, Estimated 07/14/2023 >60  >60 mL/min Final   Comment:  (NOTE) Calculated using the CKD-EPI Creatinine Equation (2021)    Anion gap 07/14/2023 9  5 - 15 Final   Performed at [facility], [address] [place], [place] [address]   Alcohol, Ethyl (B) 07/14/2023 <10  <10 mg/dL Final   Comment: (NOTE) Lowest detectable limit for serum alcohol is 10 mg/dL.  For medical  purposes only. Performed at [facility], [address] [place], [place] [address]    Salicylate Lvl 07/14/2023 <7.0 (L)  7.0 - 30.0 mg/dL Final   Performed at [facility], [address] [place], [place] [address]   Acetaminophen  (Tylenol ), Serum 07/14/2023 <10 (L)  10 - 30 ug/mL Final   Comment: (NOTE) Therapeutic concentrations vary significantly. A range of 10-30 ug/mL  may be an effective concentration for many patients. However, some  are best treated at concentrations outside of this range. Acetaminophen  concentrations >150 ug/mL at 4 hours after ingestion  and >50 ug/mL at 12 hours after ingestion are often associated with  toxic reactions.  Performed at [facility], [address] [place], [place] [address]    WBC 07/14/2023 4.0  4.0 - 10.5 K/uL Final   RBC 07/14/2023 5.21  4.22 - 5.81 MIL/uL Final   Hemoglobin 07/14/2023 14.1  13.0 - 17.0 g/dL Final   HCT 98/90/7974 41.9  39.0 - 52.0 % Final   MCV 07/14/2023 80.4  80.0 - 100.0 fL Final   MCH 07/14/2023 27.1  26.0 - 34.0 pg Final   MCHC 07/14/2023 33.7  30.0 - 36.0 g/dL Final   RDW 98/90/7974 12.9  11.5 - 15.5 % Final   Platelets 07/14/2023 273  150 - 400 K/uL Final   nRBC 07/14/2023 0.0  0.0 - 0.2 % Final   Performed at [facility], [address] [place], [place] [address]   Tricyclic, Ur Screen 07/14/2023 POSITIVE (A)  NONE DETECTED Final   Amphetamines, Ur Screen 07/14/2023 NONE DETECTED  NONE DETECTED Final   MDMA (Ecstasy)Ur Screen 07/14/2023 NONE DETECTED  NONE DETECTED Final   Cocaine Metabolite,Ur [place] 07/14/2023 NONE DETECTED  NONE DETECTED  Final   Opiate, Ur Screen 07/14/2023 NONE DETECTED  NONE DETECTED Final   Phencyclidine (PCP) Ur S 07/14/2023 NONE DETECTED  NONE DETECTED Final   Cannabinoid 50 Ng, Ur [place] 07/14/2023 NONE DETECTED  NONE DETECTED Final   Barbiturates, Ur Screen 07/14/2023 NONE DETECTED  NONE DETECTED Final   Benzodiazepine, Ur Scrn 07/14/2023 NONE DETECTED  NONE DETECTED Final   Methadone Scn, Ur 07/14/2023 NONE DETECTED  NONE DETECTED Final   Comment: (NOTE) Tricyclics + metabolites, urine    Cutoff 1000 ng/mL Amphetamines + metabolites, urine  Cutoff 1000 ng/mL MDMA (Ecstasy), urine              Cutoff 500 ng/mL Cocaine Metabolite, urine          Cutoff 300 ng/mL Opiate + metabolites, urine        Cutoff 300 ng/mL Phencyclidine (PCP), urine         Cutoff 25 ng/mL Cannabinoid, urine                 Cutoff 50 ng/mL Barbiturates + metabolites, urine  Cutoff 200 ng/mL Benzodiazepine, urine              Cutoff 200 ng/mL Methadone, urine                   Cutoff 300 ng/mL  The urine drug screen provides only a preliminary, unconfirmed analytical test result and should not be used for non-medical purposes. Clinical consideration and professional judgment should be applied to any positive drug screen result due to possible interfering substances. A more specific alternate chemical method must be used in order to obtain a confirmed analytical result. Gas chromatography / mass spectrometry (GC/MS) is the preferred confirm  atory method. Performed at [facility], [address], [place], [place] [address]     PSYCHIATRIC REVIEW OF SYSTEMS (ROS)   Depression:      [x]  Denies all symptoms of depression [] Depressed mood       [] Insomnia/hypersomnia              [] Fatigue        [] Change in appetite     [] Anhedonia                                [] Difficulty concentrating      [] Hopelessness             [] Worthlessness [] Guilt/shame                [] Psychomotor  agitation/retardation   Mania:     [x] Denies all symptoms of mania [] Elevated mood           [] Irritability         [] Pressured speech         []  Grandiosity         []  Decreased need for sleep                                                 [] Increased energy          []  Increase in goal directed activity                                       [] Flight of ideas    []  Excessive involvement in high-risk behaviors                   []  Distractibility     Psychosis:     [] Denies all symptoms of psychosis [] Paranoia         [x]  Auditory Hallucinations          [] Visual hallucinations         [] ELOC        [] IOR                [] Delusions   Suicide:    [x]  Denies SI/plan/intent []  Passive SI         []   Active SI         [] Plan           [] Intent   Homicide:  [x]   Denies HI/plan/intent []  Passive HI         []  Active HI         [] Plan            [] Intent           [] Identified Target    Additional findings:      Musculoskeletal: No abnormal movements observed      Gait & Station: Laying/Sitting      Pain Screening: Denies        RISK FORMULATION/ASSESSMENT  Is the patient experiencing any suicidal or homicidal ideations: No       Explain if yes:  Protective factors considered for safety management:  Access to adequate health care Advice& help seeking Resourcefulness/Survival skills Positive social support Positive therapeutic relationship Future oriented Suicide Inquiry:  Denies suicidal  ideations, intentions, or plans.  Denies  recent self-harm behavior. Talks futuristically.  Risk factors/concerns considered for safety management:  Access to lethal means Impulsivity Aggression Male gender Unmarried  Is there a safety management plan with the patient and treatment team to minimize risk factors and promote protective factors: Yes           Explain: safety precautions in ED Is crisis care placement or psychiatric hospitalization recommended: no     Based on my current  evaluation and risk assessment, patient is determined at this time to be at:  Low risk  *RISK ASSESSMENT Risk assessment is a dynamic process; it is possible that this patient's condition, and risk level, may change. This should be re-evaluated and managed over time as appropriate. Please re-consult psychiatric consult services if additional assistance is needed in terms of risk assessment and management. If your team decides to discharge this patient, please advise the patient how to best access emergency psychiatric services, or to call 911, if their condition worsens or they feel unsafe in any way.  Total time spent in this encounter was 60 minutes with greater than 50% of time spent in counseling and coordination of care.     Dr. Trinidad JUDITHANN Ada, PhD, MSN, APRN, PMHNP-BC, MCJ Windie Marasco  KANDICE Ada, NP Telepsychiatry Consult Services

## 2023-07-14 NOTE — BH Assessment (Signed)
 Comprehensive Clinical Assessment (CCA) Screening, Triage and Referral Note  07/14/2023 Carl [name]. [id number] Recommendations for Services/Supports/Treatments: Psych consult/disposition pending. Carl [name] is a 53 y.o., Black, Not Hispanic or Latino ethnicity, ENGLISH speaking male with psych hx of schizoaffective disorder, bipolar type. Pt is voluntary. Per triage note: Patient presents via [facility] escort from [facility]. Per note, [facility] sent patient to the ED for help. Patient told this RN he got into an argument with his family at home and that is why he went to [facility]. He is currently denying SI/HI, AH/VH. He does have legal guardian. He reports he lives with his brother and parents and that they argue a lot.  Pt was resting upon this writer's arrival. Pt's speech was garbled and difficult to understand. Pt admitted that he'd gotten into a verbal altercation with his family. Pt reported that it was nothing and everything is ok now. Pt reported that his mother called the police and he was then taken to [facility]. Pt denied substance abuse. Pt denied having sleep and appetite disturbance.  Motor behavior was restless. Pt made good eye contact. Pt's mood was anxious; affect was congruent. The pt. had lacking insight and poor judgement. Pt admitted that he hears voices. The patient denied current SI, HI or V/H. Pt expressed a need for a medication adjustment.  Chief Complaint:  Chief Complaint  Patient presents with   Aggressive Behavior        Visit Diagnosis: Schizophrenia [facility])   Patient Reported Information How did you hear about us ? Family/Friend  What Is the Reason for Your Visit/Call Today? Patient was brought to the ED for agitation and med adjustment.  How Long Has This Been Causing You Problems? <Week  What Do You Feel Would Help You the Most Today? Medication(s)   Have You Recently Had Any Thoughts About Hurting Yourself? No  Are You Planning to Commit Suicide/Harm Yourself At This time?  No   Have you Recently Had Thoughts About Hurting Someone Sherral? No  Are You Planning to Harm Someone at This Time? No  Explanation: No data recorded  Have You Used Any Alcohol or Drugs in the Past 24 Hours? No  How Long Ago Did You Use Drugs or Alcohol? No data recorded What Did You Use and How Much? No data recorded  Do You Currently Have a Therapist/Psychiatrist? Yes  Name of Therapist/Psychiatrist: Dr. Coby   Have You Been Recently Discharged From Any Office Practice or Programs? No  Explanation of Discharge From Practice/Program: No data recorded   CCA Screening Triage Referral Assessment Type of Contact: Face-to-Face  Telemedicine Service Delivery:   Is this Initial or Reassessment?   Date Telepsych consult ordered in [facility]:    Time Telepsych consult ordered in [facility]:    Location of Assessment: [facility]  Provider Location: [facility]    Collateral Involvement: None provided   Does Patient Have a Court Appointed Legal Guardian? No data recorded Name and Contact of Legal Guardian: No data recorded If Minor and Not Living with Parent(s), Who has Custody? No data recorded Is CPS involved or ever been involved? No data recorded Is APS involved or ever been involved? No data recorded  Patient Determined To Be At Risk for Harm To Self or Others Based on Review of Patient Reported Information or Presenting Complaint? No data recorded Method: No data recorded Availability of Means: No data recorded Intent: No data recorded Notification Required: No data recorded Additional Information for Danger to Others Potential: No  data recorded Additional Comments for Danger to Others Potential: No data recorded Are There Guns or Other Weapons in Your Home? No data recorded Types of Guns/Weapons: No data recorded Are These Weapons Safely Secured?                            No data recorded Who Could Verify You Are Able To Have These Secured: No data recorded Do You Have any  Outstanding Charges, Pending Court Dates, Parole/Probation? No data recorded Contacted To Inform of Risk of Harm To Self or Others: No data recorded  Does Patient Present under Involuntary Commitment? No    [place] of Residence: [place]   Patient Currently Receiving the Following Services: Medication Management; IOP (Intensive Outpatient Program)   Determination of Need: Emergent (2 hours)   Options For Referral: ED Visit; Inpatient Hospitalization; Outpatient Therapy; Medication Management   Disposition Recommendation per psychiatric provider: Psych disposition pending.  Carl [name] R Carl [name], LCAS

## 2023-07-14 NOTE — ED Notes (Signed)
 Legal guardian listed as mother on chart- this RN called mother to notify of arrival. Mother reports that patient does not have legal guardian, that he just lives with them. Mother said to call with any updates- her number is listed in patient's chart.

## 2023-07-14 NOTE — ED Triage Notes (Signed)
 Patient presents via [facility] escort from [facility]. Per note, [facility] sent patient to the ED for help. Patient told this RN he got into an argument with his family at home and that is why he went to [facility]. He is currently denying SI/HI, AH/VH. He does have legal guardian. He reports he lives with his brother and parents and that they argue a lot.

## 2023-07-15 DIAGNOSIS — R4689 Other symptoms and signs involving appearance and behavior: Secondary | ICD-10-CM

## 2023-07-15 NOTE — ED Notes (Signed)
 Spoke with pt mother, Nemiah Kissner, on telephone.  Notified mother that pt is ready for discharge.  Pt mother states she will arrive within 1 hour to transport pt home.

## 2023-07-15 NOTE — ED Provider Notes (Signed)
 Emergency Medicine Observation Re-evaluation Note  Carl [name]. is a 53 y.o. male, seen on rounds today.  Pt initially presented to the ED for complaints of Aggressive Behavior (/)  Currently, the patient is is no acute distress. Denies any concerns at this time.  Physical Exam  Blood pressure 112/74, pulse 96, temperature 98.6 F (37 C), resp. rate 18, SpO2 95%.  Physical Exam: General: No apparent distress Pulm: Normal WOB Neuro: Moving all extremities Psych: Resting comfortably     ED Course / MDM     I have reviewed the labs performed to date as well as medications administered while in observation.  Recent changes in the last 24 hours include: No acute events overnight.  Psychiatry evaluated the patient.  Did not meet inpatient criteria.  Recommended discharge home with family member.  Plan   Current plan: Patient awaiting psychiatric disposition. Patient is not under full IVC at this time.    Suzanne Kirsch, MD 07/15/23 [phone]

## 2023-07-21 ENCOUNTER — Other Ambulatory Visit: Payer: Self-pay | Admitting: Psychiatry

## 2023-07-21 DIAGNOSIS — F209 Schizophrenia, unspecified: Secondary | ICD-10-CM

## 2023-07-22 ENCOUNTER — Telehealth: Payer: Self-pay

## 2023-07-22 MED ORDER — QUETIAPINE FUMARATE ER 400 MG PO TB24: 400.0000 mg | ORAL_TABLET | Freq: Every day | ORAL | 4 refills | Status: AC

## 2023-07-22 NOTE — Telephone Encounter (Signed)
Received fax from pharmacy requesting a refill for the following medications please advise spoke to [facility] at pharmacy patient does not have any refills left   QUEtiapine (SEROQUEL XR) 400 MG 24 hr tablet  QUEtiapine (SEROQUEL XR) 50 MG TB24 24 hr tablet  Last visit 06-09-23 Next visit 09-06-23  Preferred pharmacy [facility] - [place], [place] - [address] Phone: [phone]  Fax: [phone]

## 2023-07-22 NOTE — Telephone Encounter (Signed)
Done

## 2023-07-22 NOTE — Telephone Encounter (Signed)
I have sent Seroquel to pharmacy as requested.

## 2023-08-24 ENCOUNTER — Telehealth: Payer: Self-pay

## 2023-08-24 DIAGNOSIS — G4709 Other insomnia: Secondary | ICD-10-CM

## 2023-08-24 NOTE — Telephone Encounter (Signed)
Fax received from patients pharmacy requesting a refill for the following medications spoke to [facility] she stated that the patient does not have any refills on fill please advise  fluPHENAZine (PROLIXIN) 2.5 MG tablet   Last visit 06-09-23 Next visit 09-06-23  Preferred pharmacy [facility] - [place], [place] - [address] Phone: [phone]  Fax: [phone]

## 2023-08-25 MED ORDER — FLUPHENAZINE HCL 2.5 MG PO TABS: 7.5000 mg | ORAL_TABLET | Freq: Three times a day (TID) | ORAL | 3 refills | Status: AC

## 2023-08-25 NOTE — Telephone Encounter (Signed)
I have sent fluphenazine to pharmacy.

## 2023-08-25 NOTE — Addendum Note (Signed)
Addended byJomarie Longs on: 08/25/2023 08:03 AM   Modules accepted: Orders

## 2023-09-04 NOTE — Progress Notes (Unsigned)
 Virtual Visit via Video Note  I connected with Carl [name]. on 09/06/23 at 11:30 AM EST by a video enabled telemedicine application and verified that I am speaking with the correct person using two identifiers.  Location Provider Location : [facility] Patient Location : Home  Participants: Patient , Parents,Provider    I discussed the limitations of evaluation and management by telemedicine and the availability of in person appointments. The patient expressed understanding and agreed to proceed.   I discussed the assessment and treatment plan with the patient. The patient was provided an opportunity to ask questions and all were answered. The patient agreed with the plan and demonstrated an understanding of the instructions.   The patient was advised to call back or seek an in-person evaluation if the symptoms worsen or if the condition fails to improve as anticipated.   BH MD OP Progress Note  09/06/2023 2:57 PM Carl [name].  MRN:  [id number]  Chief Complaint:  Chief Complaint  Patient presents with   Follow-up   Depression   Medication Refill   HPI: Carl Randallis a 53 year old African-American male, single, lives in [place], has a history of schizophrenia , borderline intellectual function, insomnia, was evaluated by telemedicine today.  Patient being a limited historian collateral information obtained from mother and father.  He experiences persistent auditory hallucinations, describing them as 'regular kind' of voices. These voices occur daily, and while he can sometimes ignore them by engaging in activities like watching TV, they remain a constant presence. He denies any suicidal ideation or thoughts of harming others.  His current medication regimen includes two antipsychotics(previously patient was on 3 antipsychotic medications and still continued to have symptoms and they did not work and caused side effects), but his parents feel they are not fully effective  as he continues to hear voices. He often appears drowsy and difficult to understand, spending most of his day sleeping. He takes his medications at varying times, sometimes not until he wakes up in the morning.  His daily routine lacks structure, often staying in bed until late afternoon and only getting up for appointments. His mother notes past involvement in programs, but currently not interested.  Recently, he experienced an episode of agitation and was taken to the emergency department at [facility] by his father, where he stayed for observation.  No medication changes were made at that visit.  "  Mother today frustrated with the fact that patient does not seem to be making any progress, not able to do anything much during the day.  Continues to have lack of sleep hygiene since he takes his medication and stays in bed the whole day.  Patient takes Seroquel and trazodone at night and that helps him to sleep at night.  Hence sleeping excessively throughout the day and at night.  Also he does not have a good sleep hygiene does not have a set bedtime or wake up time.  Her mother also reports he has done this always and since the past several months likely getting worse with recent episode of agitation resulting in visit to the emergency department for an observation admission.  Unknown if patient is depressed, patient unable to verbalize much today.    Visit Diagnosis:    ICD-10-CM   1. Schizophrenia, chronic condition with acute exacerbation (HCC)  F20.9     2. Other insomnia  G47.09 traZODone (DESYREL) 100 MG tablet   Lack of sleep hygiene, on multiple psychotropics which likely could be sedating  3. 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  I have reviewed past psychiatric  history from my progress note on 08/21/2021.  Multiple inpatient behavioral health admissions starting at the age of 40 at [facility] [place] 2019 twice, [facility], in 2020, recent inpatient behavioral health admission at [facility], 08/17/2022 - 08/18/2022.  Recent emergency department visit and observation admission-07/14/2023 after an episode of agitation. Patient failed multiple trials of medications in the past-Haldol-caused him to have seizures, Clozaril-side effect, Seroquel, Prolixin, risperidone, Zyprexa and multiple other medications.  Past Medical History:  Past Medical History:  Diagnosis Date   Abdominal pain    Anxiety    Constipation    Decreased visual acuity    Nevus    Polyuria    Recurrent boils    scalp behind ear   Schizophrenia (HCC)    Seizures (HCC) 08/15/1990   due to haldol   Soft tissue mass    left scalp posterior    Past Surgical History:  Procedure Laterality Date   COLONOSCOPY WITH PROPOFOL N/A 02/14/2020   Procedure: COLONOSCOPY WITH PROPOFOL;  Surgeon: Pasty Spillers, MD;  Location: [facility] ENDOSCOPY;  Service: Endoscopy;  Laterality: N/A;   COLONOSCOPY WITH PROPOFOL N/A 02/28/2020   Procedure: COLONOSCOPY WITH PROPOFOL;  Surgeon: Pasty Spillers, MD;  Location: [facility] ENDOSCOPY;  Service: Endoscopy;  Laterality: N/A;   COLONOSCOPY WITH PROPOFOL N/A 03/18/2021   Procedure: COLONOSCOPY WITH PROPOFOL;  Surgeon: Pasty Spillers, MD;  Location: [facility] ENDOSCOPY;  Service: Endoscopy;  Laterality: N/A;   LAPAROSCOPIC RIGHT COLECTOMY Right 03/11/2020   Procedure: LAPAROSCOPIC RIGHT COLECTOMY, possible open;  Surgeon: Henrene Dodge, MD;  Location: [facility] ORS;  Service: General;  Laterality: Right;   MASS EXCISION Left 02/11/2015   Procedure: EXCISION MASS, left post neck;  Surgeon: Lattie Haw, MD;  Location: [facility];  Service: General;  Laterality: Left;    Family Psychiatric History: I have reviewed family psychiatric  history from progress note on 09/09/2021.  Family History:  Family History  Problem Relation Age of Onset   Hyperlipidemia Mother    Hypertension Mother    Fibromyalgia Mother    Hypertension Father    Diabetes Father    Hyperlipidemia Father    Schizophrenia Cousin    Schizophrenia Other    Mental illness Neg Hx     Social History: I have reviewed social history from progress note on 09/09/2021. Social History   Socioeconomic History   Marital status: Single    Spouse name: Not on file   Number of children: Not on file   Years of education: Not on file   Highest education level: 12th grade  Occupational History   Occupation: on disability  Tobacco Use   Smoking status: Never   Smokeless tobacco: Never  Vaping Use   Vaping status: Never Used  Substance and Sexual Activity   Alcohol use: No    Alcohol/week: 0.0 standard drinks of alcohol   Drug use: No   Sexual activity: Not Currently  Other Topics Concern   Not on file  Social History Narrative   Not on file   Social Drivers of Health   Financial Resource Strain: Not on file  Food Insecurity: No Food Insecurity (08/18/2022)  Hunger Vital Sign    Worried About Running Out of Food in the Last Year: Never true    Ran Out of Food in the Last Year: Never true  Transportation Needs: No Transportation Needs (08/18/2022)   PRAPARE - Administrator, Civil Service (Medical): No    Lack of Transportation (Non-Medical): No  Physical Activity: Not on file  Stress: Not on file  Social Connections: Not on file    Allergies:  Allergies  Allergen Reactions   Haldol [Haloperidol] Other (See Comments)    Causes seizures    Metabolic Disorder Labs: Lab Results  Component Value Date   HGBA1C 5.1 08/22/2022   MPG 99.67 08/22/2022   MPG 111 08/24/2021   Lab Results  Component Value Date   PROLACTIN 41.2 (H) 08/24/2021   Lab Results  Component Value Date   CHOL 176 08/22/2022   TRIG 129 08/22/2022   HDL  43 08/22/2022   CHOLHDL 4.1 08/22/2022   VLDL 26 08/22/2022   LDLCALC 107 (H) 08/22/2022   LDLCALC 149 (H) 08/24/2021   Lab Results  Component Value Date   TSH 2.016 08/22/2022   TSH 1.047 08/24/2021    Therapeutic Level Labs: No results found for: "LITHIUM" Lab Results  Component Value Date   VALPROATE 97 04/28/2019   VALPROATE 99 09/09/2017   No results found for: "CBMZ"  Current Medications: Current Outpatient Medications  Medication Sig Dispense Refill   QUEtiapine (SEROQUEL XR) 50 MG TB24 24 hr tablet TAKE 1 TABLET BY MOUTH AT BEDTIME (ALONGWITH QUETIAPINE ER 400 MG FOR A TOTAL OF450 MG) 30 tablet 4   cetirizine (ZYRTEC) 10 MG tablet Take 10 mg by mouth daily as needed for allergies.     docusate sodium (COLACE) 100 MG capsule Take 1 capsule (100 mg total) by mouth daily. 30 capsule 1   famotidine (PEPCID) 20 MG tablet Take 20 mg by mouth 2 (two) times daily.     fluPHENAZine (PROLIXIN) 2.5 MG tablet Take 3 tablets (7.5 mg total) by mouth 3 (three) times daily. 270 tablet 3   fluticasone (FLONASE) 50 MCG/ACT nasal spray Place 2 sprays into the nose daily as needed for allergies.      QUEtiapine (SEROQUEL XR) 400 MG 24 hr tablet Take 1 tablet (400 mg total) by mouth at bedtime. Take daily along with 50 mg 30 tablet 4   traZODone (DESYREL) 100 MG tablet Take 1.5-2 tablets (150-200 mg total) by mouth at bedtime as needed for sleep. Use only as needed for sleep- HOLD FOR NOW     trihexyphenidyl (ARTANE) 2 MG tablet TAKE 1 TABLET BY MOUTH 2 TIMES DAILY WITH A MEAL FOR TREMORS 60 tablet 5   No current facility-administered medications for this visit.     Musculoskeletal: Strength & Muscle Tone:  UTA Gait & Station:  Seated Patient leans: N/A  Psychiatric Specialty Exam: Review of Systems  Unable to perform ROS: Psychiatric disorder    There were no vitals taken for this visit.There is no height or weight on file to calculate BMI.  General Appearance: Casual  Eye  Contact:  Fair  Speech:  Normal Rate, dysarthric at times hard to understand speech at times.  Which is baseline and varies  Volume:  Decreased  Mood:   Appears to be anxious and dysphoric , unable to verbalize   Affect:  Constricted  Thought Process:  Goal Directed and Descriptions of Associations: Intact  Orientation:  Other:  self , situation  Thought  Content: Hallucinations: Auditory chronic  Suicidal Thoughts:  No  Homicidal Thoughts:  No  Memory:  Immediate;   Fair Recent;   Poor Remote;   Poor  Judgement:  Fair  Insight:  Shallow  Psychomotor Activity:  Normal  Concentration:  Concentration: Poor and Attention Span: Poor  Recall:  Poor  Fund of Knowledge: Poor  Language: Fair  Akathisia:  No  Handed:  Right  AIMS (if indicated): not done  Assets:  Desire for Improvement Housing Social Support Transportation  ADL's:  Intact  Cognition: baseline  Sleep:  excessive during the day    Screenings: AIMS    Flowsheet Row Office Visit from 05/11/2023 in [facility] Office Visit from 11/17/2022 in [facility] Video Visit from 09/29/2022 in [facility] Office Visit from 09/09/2022 in [facility]  AIMS Total Score 0 0 0 0 0      AUDIT    Flowsheet Row [facility]) [facility] Admission (Discharged) from 09/05/2017 in [facility]  Alcohol Use Disorder Identification Test Final Score (AUDIT) 0 0      GAD-7    Flowsheet Row Office Visit from 05/11/2023 in [facility] Office Visit from 11/17/2022 in [facility] Office Visit from 09/09/2022 in [facility] Office Visit from 05/18/2022 in [facility] Office Visit from 03/02/2022 in [facility]  Total GAD-7 Score 0 0 6 0 0      PHQ2-9    Flowsheet Row Office Visit from 05/11/2023 in [facility] Office Visit from 11/17/2022 in [facility] Office Visit from 09/09/2022 in [facility] Office Visit from 05/18/2022 in [facility] Office Visit from 03/02/2022 in [facility]  PHQ-2 Total Score 0 0 0 0 0  PHQ-9 Total Score -- -- 5 0 --      Flowsheet Row Video Visit from 09/06/2023 in [facility] ED from 07/14/2023 in [facility] at [facility] Video Visit from 06/09/2023 in [facility]  C-SSRS RISK CATEGORY No Risk No Risk No Risk        Assessment and Plan: Carl Randallis a 53 year old African-American male on disability, has a history of schizophrenia, failed trials of medications like Clozaril(developed side effects) presented for a follow-up appointment.  Discussed assessment and plan as noted below.  Schizophrenia -unstable Chronic schizophrenia with persistent auditory hallucinations despite dual antipsychotic therapy. Recent symptom exacerbation necessitated an emergency department visit. Reports regular auditory hallucinations but maintains task focus. Family notes significant sedation and drowsiness, likely medication-induced, leading to inactivity and prolonged bed rest. Discussed the necessity of structured activities and potential benefits of a day program. Emphasized that medications alone may not suffice and additional structured support is essential. Considered inpatient admission if  outpatient adjustments fail. - Adjust morning dose of Fluphenazine to 1-2 tablets and distribute remaining dose throughout the day - Fluphenazine 7.5 mg 3 times a day. - Continue Seroquel XR 450 mg at bedtime.  Will consider reducing this dosage in the future as needed. - Hold Trazodone at night due to  excessive sedation and sleepiness. - [facility]er to [facility] ,I have already communicated with them and will fax a referral form to them. 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 - Continue Trihexyphenidyl 2 mg twice a day.  Insomnia-unstable Patient with excessive sleepiness throughout the day, sleep at night varies, lacks sleep hygiene or structure. - Hold Trazodone 200 mg for now. - Discussed sleep hygiene practices. - Reevaluate in 3 to 4 weeks.  Depression unspecified-unstable Patient being a limited historian unable to assess through virtual visit.  However per collateral information patient socially withdrawn, not participating much spends a lot of time in bed likely multifactorial including being on medications which are sedating. - Will refer [facility] to [facility].  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  I have placed a referral to [facility].   Collateral information obtained from mother and father who seems to be frustrated with the fact that patient is not making any progress and has been struggling with social, date today functioning.  Will refer [facility] to [facility] as noted above.  Crisis plan discussed, patient may need inpatient behavioral health admission if he has any worsening symptoms.    Collaboration of Care: Collaboration of Care: Other I have referred [facility] to [facility].  Patient/Guardian was advised Release of Information must be obtained prior to any record release in order to collaborate their care with an outside provider. Patient/Guardian was advised if they have not already done so to contact the registration department to sign all necessary forms in order for [place] to release information regarding their care.   Consent: Patient/Guardian gives verbal consent for treatment and assignment of benefits for services provided during this visit. Patient/Guardian expressed understanding and agreed to proceed.   I have spent atleast 40  minutes face to face with patient today which includes the time spent for preparing to see the patient ( e.g., review of test, records ), obtaining and to review and separately obtained history , ordering medications ,psychoeducation and supportive psychotherapy and care coordination,as well as documenting clinical information in electronic health record.    Jomarie Longs, MD 09/06/2023, 2:57 PM

## 2023-09-06 ENCOUNTER — Telehealth: Payer: Self-pay | Admitting: Psychiatry

## 2023-09-06 ENCOUNTER — Telehealth (INDEPENDENT_AMBULATORY_CARE_PROVIDER_SITE_OTHER): Payer: 59 | Admitting: Psychiatry

## 2023-09-06 ENCOUNTER — Encounter: Payer: Self-pay | Admitting: Psychiatry

## 2023-09-06 DIAGNOSIS — T43505D Adverse effect of unspecified antipsychotics and neuroleptics, subsequent encounter: Secondary | ICD-10-CM

## 2023-09-06 DIAGNOSIS — F32A Depression, unspecified: Secondary | ICD-10-CM | POA: Diagnosis not present

## 2023-09-06 DIAGNOSIS — G4709 Other insomnia: Secondary | ICD-10-CM

## 2023-09-06 DIAGNOSIS — R4183 Borderline intellectual functioning: Secondary | ICD-10-CM

## 2023-09-06 DIAGNOSIS — G2111 Neuroleptic induced parkinsonism: Secondary | ICD-10-CM | POA: Diagnosis not present

## 2023-09-06 DIAGNOSIS — F209 Schizophrenia, unspecified: Secondary | ICD-10-CM | POA: Diagnosis not present

## 2023-09-06 DIAGNOSIS — F331 Major depressive disorder, recurrent, moderate: Secondary | ICD-10-CM | POA: Insufficient documentation

## 2023-09-06 MED ORDER — TRAZODONE HCL 100 MG PO TABS: 150.0000 mg | ORAL_TABLET | Freq: Every evening | ORAL | Status: AC | PRN

## 2023-09-06 NOTE — Telephone Encounter (Signed)
 Spoke to patients mother and explain everything to her she voiced understanding

## 2023-09-06 NOTE — Telephone Encounter (Signed)
 Patient seems to be struggling with regards to finding a structure in his life spending a lot of time in bed.  Patient continues to hear voices and unable to verbalize specifics.  Although he tends to be able to cope with his voices without any acting out episodes.  According to mother patient is staying in his room most of the time wonders whether medications are contributing to side effects.  Mother reports she would like him to go into a program however since he is 53 years old she can only tell him and cannot force him.  Briefly discussed with parents and patient that he may benefit from a higher level of care.  I have reached out to [facility].  We will go ahead and send a referral for this patient.

## 2023-09-14 ENCOUNTER — Telehealth: Payer: Self-pay

## 2023-09-14 ENCOUNTER — Encounter: Payer: Self-pay | Admitting: Psychiatry

## 2023-09-14 NOTE — Telephone Encounter (Signed)
 called [facility] per Dr. Elna Breslow order to find out if they take insurance and also refer for ACT. she stated that they don;t do referral it 1st come 1st serve. she states that they do ACT Evaluation from 8-2 but they only take 6 pt monday - thursday and they take 12 pt on Fridays from 8-2

## 2023-09-14 NOTE — Telephone Encounter (Signed)
 tried to call phone keeps ring/no answer no message could be left

## 2023-09-14 NOTE — Telephone Encounter (Signed)
 Letter prepared and ready to cert mail.

## 2023-09-14 NOTE — Telephone Encounter (Signed)
 Per dr. Elna Breslow "jess please call family and let them know that is what I recommend and since he is not making any improvement here I am going to release him " for not following my treatment recommendations " tell them I can dismiss him and give them community resources  he needs a higher level of care "

## 2023-09-14 NOTE — Telephone Encounter (Signed)
 This patient needs a higher level of care and that is why he was referred to [facility].  If patient is not compliant with my treatment recommendation, please call and discuss clinic policy.  We can provide him community resources to find a new provider.

## 2023-09-14 NOTE — Telephone Encounter (Signed)
 Please try again and let me know

## 2023-09-14 NOTE — Telephone Encounter (Signed)
 Carl [name] called states that he tried to set up appt with pt but that they refused services.

## 2023-09-14 NOTE — Telephone Encounter (Signed)
 I will go ahead and print out a letter . Please let patient know .

## 2023-09-14 NOTE — Telephone Encounter (Signed)
no answer no message could be left.    

## 2023-10-10 ENCOUNTER — Telehealth: Payer: Self-pay | Admitting: Psychiatry

## 2023-10-10 NOTE — Telephone Encounter (Signed)
 Patient needs a higher level of care, was referred to [facility].  Patient was also given other resources in the community.

## 2023-10-18 ENCOUNTER — Other Ambulatory Visit: Payer: Self-pay | Admitting: Psychiatry

## 2023-10-18 DIAGNOSIS — G2111 Neuroleptic induced parkinsonism: Secondary | ICD-10-CM

## 2024-05-09 ENCOUNTER — Inpatient Hospital Stay: Payer: 59 | Admitting: Oncology

## 2024-05-09 ENCOUNTER — Encounter: Payer: Self-pay | Admitting: Oncology

## 2024-05-09 ENCOUNTER — Inpatient Hospital Stay: Payer: 59 | Attending: Family Medicine
